# Patient Record
Sex: Male | Born: 1965 | Race: White | Hispanic: No | Smoking: Never smoker
Health system: Southern US, Community
[De-identification: ages and names within clinical notes are randomized; demographics above are authoritative.]

## PROBLEM LIST (undated history)

## (undated) DIAGNOSIS — M545 Low back pain, unspecified: Secondary | ICD-10-CM

## (undated) DIAGNOSIS — K219 Gastro-esophageal reflux disease without esophagitis: Secondary | ICD-10-CM

## (undated) DIAGNOSIS — E785 Hyperlipidemia, unspecified: Secondary | ICD-10-CM

## (undated) DIAGNOSIS — F419 Anxiety disorder, unspecified: Secondary | ICD-10-CM

## (undated) DIAGNOSIS — T7840XA Allergy, unspecified, initial encounter: Secondary | ICD-10-CM

## (undated) DIAGNOSIS — C61 Malignant neoplasm of prostate: Secondary | ICD-10-CM

## (undated) DIAGNOSIS — D649 Anemia, unspecified: Secondary | ICD-10-CM

## (undated) DIAGNOSIS — M109 Gout, unspecified: Secondary | ICD-10-CM

## (undated) DIAGNOSIS — I10 Essential (primary) hypertension: Secondary | ICD-10-CM

## (undated) DIAGNOSIS — IMO0002 Reserved for concepts with insufficient information to code with codable children: Secondary | ICD-10-CM

## (undated) HISTORY — PX: CHOLESTEATOMA EXCISION: SHX1345

## (undated) HISTORY — PX: OTHER SURGICAL HISTORY: SHX169

## (undated) HISTORY — DX: Reserved for concepts with insufficient information to code with codable children: IMO0002

## (undated) HISTORY — PX: VASECTOMY: SHX75

## (undated) HISTORY — PX: PROSTATE BIOPSY: SHX241

## (undated) HISTORY — DX: Gout, unspecified: M10.9

## (undated) HISTORY — DX: Anemia, unspecified: D64.9

## (undated) HISTORY — DX: Gastro-esophageal reflux disease without esophagitis: K21.9

## (undated) HISTORY — PX: CHOLECYSTECTOMY: SHX55

## (undated) HISTORY — DX: Gilbert syndrome: E80.4

## (undated) HISTORY — DX: Anxiety disorder, unspecified: F41.9

## (undated) HISTORY — DX: Hyperlipidemia, unspecified: E78.5

## (undated) HISTORY — PX: WISDOM TOOTH EXTRACTION: SHX21

## (undated) HISTORY — DX: Allergy, unspecified, initial encounter: T78.40XA

## (undated) HISTORY — DX: Essential (primary) hypertension: I10

---

## 1997-10-07 ENCOUNTER — Ambulatory Visit (HOSPITAL_COMMUNITY): Admission: RE | Admit: 1997-10-07 | Discharge: 1997-10-07 | Payer: Self-pay | Admitting: *Deleted

## 1998-03-14 ENCOUNTER — Ambulatory Visit (HOSPITAL_COMMUNITY): Admission: RE | Admit: 1998-03-14 | Discharge: 1998-03-14 | Payer: Self-pay | Admitting: *Deleted

## 2002-07-14 ENCOUNTER — Emergency Department (HOSPITAL_COMMUNITY): Admission: EM | Admit: 2002-07-14 | Discharge: 2002-07-14 | Payer: Self-pay | Admitting: Emergency Medicine

## 2002-07-14 ENCOUNTER — Encounter: Payer: Self-pay | Admitting: Emergency Medicine

## 2002-07-19 ENCOUNTER — Encounter: Payer: Self-pay | Admitting: Surgery

## 2002-07-19 ENCOUNTER — Encounter (INDEPENDENT_AMBULATORY_CARE_PROVIDER_SITE_OTHER): Payer: Self-pay | Admitting: *Deleted

## 2002-07-19 ENCOUNTER — Encounter: Payer: Self-pay | Admitting: Emergency Medicine

## 2002-07-19 ENCOUNTER — Encounter (INDEPENDENT_AMBULATORY_CARE_PROVIDER_SITE_OTHER): Payer: Self-pay | Admitting: Specialist

## 2002-07-19 ENCOUNTER — Inpatient Hospital Stay (HOSPITAL_COMMUNITY): Admission: EM | Admit: 2002-07-19 | Discharge: 2002-07-21 | Payer: Self-pay | Admitting: Emergency Medicine

## 2002-07-20 ENCOUNTER — Encounter: Payer: Self-pay | Admitting: Surgery

## 2002-07-20 ENCOUNTER — Encounter (INDEPENDENT_AMBULATORY_CARE_PROVIDER_SITE_OTHER): Payer: Self-pay | Admitting: *Deleted

## 2002-08-02 ENCOUNTER — Encounter (INDEPENDENT_AMBULATORY_CARE_PROVIDER_SITE_OTHER): Payer: Self-pay | Admitting: *Deleted

## 2009-03-01 ENCOUNTER — Emergency Department (HOSPITAL_BASED_OUTPATIENT_CLINIC_OR_DEPARTMENT_OTHER): Admission: EM | Admit: 2009-03-01 | Discharge: 2009-03-02 | Payer: Self-pay | Admitting: Emergency Medicine

## 2009-03-02 ENCOUNTER — Telehealth (INDEPENDENT_AMBULATORY_CARE_PROVIDER_SITE_OTHER): Payer: Self-pay | Admitting: *Deleted

## 2009-03-02 ENCOUNTER — Ambulatory Visit: Payer: Self-pay | Admitting: Gastroenterology

## 2009-03-02 ENCOUNTER — Telehealth: Payer: Self-pay | Admitting: Gastroenterology

## 2009-03-09 ENCOUNTER — Ambulatory Visit: Payer: Self-pay | Admitting: Gastroenterology

## 2009-03-09 ENCOUNTER — Encounter: Payer: Self-pay | Admitting: Gastroenterology

## 2009-03-10 ENCOUNTER — Encounter (INDEPENDENT_AMBULATORY_CARE_PROVIDER_SITE_OTHER): Payer: Self-pay

## 2009-03-13 ENCOUNTER — Encounter: Payer: Self-pay | Admitting: Gastroenterology

## 2009-04-12 ENCOUNTER — Ambulatory Visit: Payer: Self-pay | Admitting: Gastroenterology

## 2009-04-12 DIAGNOSIS — K222 Esophageal obstruction: Secondary | ICD-10-CM | POA: Insufficient documentation

## 2009-07-15 HISTORY — PX: UPPER GASTROINTESTINAL ENDOSCOPY: SHX188

## 2010-03-12 ENCOUNTER — Encounter (INDEPENDENT_AMBULATORY_CARE_PROVIDER_SITE_OTHER): Payer: Self-pay | Admitting: *Deleted

## 2010-08-16 NOTE — Letter (Signed)
Summary: Office Visit Letter  [facility]  [address] [place], [place] [address]   Phone: [phone]  Fax: [phone]      March 12, 2010 MRN: [id number]   [facility] [address] [place], [place]  [address]   Dear Mr. [facility],   According to our records, it is time for you to schedule a follow-up office visit with [place].   At your convenience, please call [phone] (option #2)to schedule an office visit. If you have any questions, concerns, or feel that this letter is in error, we would appreciate your call.   Sincerely,  Judie Petit T. [name], M.D.  [facility] [phone]

## 2010-08-24 ENCOUNTER — Ambulatory Visit (INDEPENDENT_AMBULATORY_CARE_PROVIDER_SITE_OTHER): Payer: 59 | Admitting: Gastroenterology

## 2010-08-24 ENCOUNTER — Encounter: Payer: Self-pay | Admitting: Gastroenterology

## 2010-08-24 DIAGNOSIS — K219 Gastro-esophageal reflux disease without esophagitis: Secondary | ICD-10-CM

## 2010-08-30 NOTE — Assessment & Plan Note (Signed)
Summary: Refills   History of Present Illness Visit Type: Follow-up Visit Primary GI MD: Elie Goody MD [facility] Primary Provider: Marisue Brooklyn, DO  Requesting Provider: n/a Chief Complaint: Follow up for Omeprazole refills, Pt denies any GI complaints History of Present Illness:   This is a return office visit for GERD with a prior history of a peptic stricture. He denies dysphagia. His reflux symptoms are under excellent control on omeprazole. He occasionally has some mild breakthrough symptoms with certain foods.   GI Review of Systems      Denies abdominal pain, acid reflux, belching, bloating, chest pain, dysphagia with liquids, dysphagia with solids, heartburn, loss of appetite, nausea, vomiting, vomiting blood, weight loss, and  weight gain.        Denies anal fissure, black tarry stools, change in bowel habit, constipation, diarrhea, diverticulosis, fecal incontinence, heme positive stool, hemorrhoids, irritable bowel syndrome, jaundice, light color stool, liver problems, rectal bleeding, and  rectal pain.   Current Medications (verified): 1)  Multivitamins   Tabs (Multiple Vitamin) .... One Tablet By Mouth Daily 2)  Omeprazole 40 Mg Cpdr (Omeprazole) .... One Tablet By Mouth Every Morning 3)  Ibuprofen 400 Mg Tabs (Ibuprofen) .Marland Kitchen.. 1 By Mouth At Bedtime As Needed  Allergies (verified): No Known Drug Allergies  Past History:  Past Medical History: Last updated: 04/12/2009 GERD Hiatal hernia Peptic esophageal stricture, 2010  Past Surgical History: Last updated: 04/11/2009 Cholecystectomy  Family History: Last updated: 04/12/2009 No FH of Colon Cancer: Lung Cancer: Mother  Family History of Liver Cancer:? Mother  Family History of Heart Disease: MGM  Social History: Last updated: 04/12/2009 Patient has never smoked.  Occupation: Emergency planning/management officer  Married 2 childern  Alcohol Use - no Daily Caffeine Use: 4 daily   Review of Systems       The pertinent  positives and negatives are noted as above and in the HPI. All other ROS were reviewed and were negative.   Vital Signs:  Patient profile:   45 year old male Height:      74 inches Weight:      278 pounds BMI:     35.82 BSA:     2.50 Pulse rate:   100 / minute Pulse rhythm:   regular BP sitting:   112 / 80  (left arm)  Vitals Entered By: Merri Ray CMA Duncan Dull) (August 24, 2010 4:02 PM)  Physical Exam  General:  Well developed, well nourished, no acute distress. Head:  Normocephalic and atraumatic. Eyes:  PERRLA, no icterus. Ears:  Normal auditory acuity. Mouth:  No deformity or lesions, dentition normal. Lungs:  Clear throughout to auscultation. Heart:  Regular rate and rhythm; no murmurs, rubs,  or bruits. Abdomen:  Soft, nontender and nondistended. No masses, hepatosplenomegaly or hernias noted. Normal bowel sounds. Neurologic:  Alert and  oriented x4;  grossly normal neurologically. Psych:  Alert and cooperative. Normal mood and affect.  Impression & Recommendations:  Problem # 1:  GERD (ICD-530.81) Continue omeprazole 40 mg daily, and standard antireflux measures.  Problem # 2:  SCREENING COLORECTAL-CANCER (ICD-V76.51) Average risk for colorectal cancer. Plan for a screening colonoscopy at age 61, June 2015.  Patient Instructions: 1)  Prescription has been sent to your pharmacy.  2)  Please schedule a follow-up appointment in 1 year. 3)  Copy sent to : Marisue Brooklyn, DO 4)  The medication list was reviewed and reconciled.  All changed / newly prescribed medications were explained.  A complete medication list was provided  to the patient / caregiver.  Prescriptions: OMEPRAZOLE 40 MG CPDR (OMEPRAZOLE) one tablet by mouth every morning  #34 x 11   Entered by:   Christie Nottingham CMA (AAMA)   Authorized by:   Meryl Dare MD [facility]   Signed by:   Christie Nottingham CMA (AAMA) on 08/24/2010   Method used:   Electronically to        [facility] [phone][facility] (retail)        [address]       [place], [place]  [address]       Ph: [phone] or [phone]       Fax: [phone]   RxID:   [id number]

## 2010-08-30 NOTE — Op Note (Signed)
Summary: Laparoscopic Cholecystectomy    NAME:  [name], [name]                         ACCOUNT NO.:  [id number]   MEDICAL RECORD NO.:  [id number]                   PATIENT TYPE:  INP   LOCATION:  0105                                 [facility]ACILITY[facility]  [facility]   PHYSICIAN:  Abigail Miyamoto, M.D.              DATE OF BIRTH:  12-19-1965   DATE OF PROCEDURE:  07/19/2002  DATE OF DISCHARGE:                                 OPERATIVE REPORT   PREOPERATIVE DIAGNOSIS:  Acute cholecystitis.   POSTOPERATIVE DIAGNOSIS:  Acute cholecystitis with choledocholithiasis.   PROCEDURE:  Laparoscopic cholecystectomy with intraoperative cholangiogram.   SURGEON:  Abigail Miyamoto, M.D.   ASSISTANT:  Vikki Ports, M.D.   ANESTHESIA:  General endotracheal anesthesia, 0.25% Marcaine.   INDICATIONS:  [name] is a 45 year old gentleman who presented with  right upper quadrant pain, nausea, and vomiting. He was found on ultrasound  to have a thickened gallbladder wall, consistent with acute cholecystitis.  His common bile duct was measured at 7 mm.  There was an impacted gallstone.  The patient was also noted to have a mildly elevated white blood count and a  bilirubin of 1.6.  The decision was made to proceed to the operating room  for a cholecystectomy.   FINDINGS:  The patient was found to have a dilated common bile duct with a  stone stuck distally without evidence of contrast and fully passed it into  the duodenum.   PROCEDURE IN DETAIL:  The patient was brought to the operating room and  identified as [name].  He was placed supine on the operating room  table, and general anesthesia was induced.  His abdomen was then prepped and  draped in the usual sterile fashion.  Using a #15 blade, a small transverse  incision was made below the umbilicus.  The incision was carried down  through the fascia, which was then opened with a scalpel.  A hemostat was  then used to pass through the  peritoneal cavity.  Next, a 0 Vicryl  pursestring suture was placed around the fascial opening.  The Hasson port  was placed through the opening, and insufflation of the abdomen begun. Next,  a 12 mm port was placed in the patient's epigastrium.  Two 5 mm ports were  placed in the patient's right flank under direct vision.  The gallbladder  was then identified and found to be acutely inflamed.  It was grasped and  retracted above the liver bed.  The cystic duct was then dissected out and  clipped once distally.  It was then partly transected with scissors.  Several stones were pushed out of the opening in the cystic duct.  Next, an  angiocath was inserted in the right upper quadrant under direct vision.  The  cholangiocath was inserted through the angiocath and into the cystic duct.  A cholangiogram was then performed under direct fluoroscopy.  Good flow  of  contrast was seen in the entire common bile duct and biliary radicals.  A  stone could be identified in the distal common bile duct, and no contrast  could be seen and fully passed this into the duodenum. At this point, the  cholangiocather was removed.  One large stone did come out of the cystic  duct after this.  The cystic duct was then clipped between clips proximally  and completely transected. The cystic artery was then identified and clipped  twice proximally and once distally and transected with scissors.  The  gallbladder was then slowly dissected free from the liver bed with the  electrocautery.  Once the gallbladder was freed from the liver bed,  hemostasis was achieved with the cautery.  The gallbladder was then placed  in an endo sac.  The decision was then made to place a Blake drain into the  gallbladder fossa.  The Blake drain was inserted through the epigastric port  and pulled out through one of the lateral side ports.  The Blake drain was  placed into the gallbladder fossa.  It was secured in place with a single   nylon suture.  The abdomen was then copiously irrigated with normal saline.  Again, hemostasis appeared to be achieved.  The gallbladder was then removed  through the incision at the umbilicus, and a 0 Ethibond blanket was tied in  place, closing the fascial defect.  All ports were then removed under direct  vision.  The abdomen was deflated.  All incisions were then anesthetized  with 0.25% Marcaine and then closed with 4-0 Monocryl subcuticular sutures.  Steri-Strips, gauze, and tape were applied.  The patient tolerated the  procedure well.  All sponge, needle, and instrument counts were correct at  the end of the procedure.  The patient was then extubated in the operating  room and taken in a stable condition to the recovery room.   PLAN:  The plan will be to proceed with a gastroenterology consult  postoperatively.                                               Abigail Miyamoto, M.D.    DB/MEDQ  D:  07/19/2002  T:  07/19/2002  Job:  [id number]   cc:   Petra Kuba, M.D.  [address]  [place]  [place] [address]  Fax: [phone]       SP Surgical Pathology - STATUS: Final             By: Morrie Sheldon,       Perform Date: 5 Jan04 00:00  Ordered By: Forrestine Him Date:  Facility: [facility]                              Department: CPATH  Service Report Text  [facility]   [address] [place], [place] [address]   [phone]    REPORT OF SURGICAL PATHOLOGY    Case #: [id number]   Patient Name: [name], [name]   PID: [id number]   Pathologist: Beulah Gandy. Luisa Hart, MD   DOB/Age [birth year] (Age: 43) Gender: M   Date Taken: 07/19/2002   Date Received: 07/19/2002  FINAL DIAGNOSIS    ***MICROSCOPIC EXAMINATION AND DIAGNOSIS***    GALLBLADDER: CHRONIC CHOLECYSTITIS AND CHOLELITHIASIS.    mw   Date Reported: 07/20/2002 Beulah Gandy. Luisa Hart, MD   *** Electronically Signed Out By JDP ***    Clinical information   Acute  cholecystitis. (tmc)    specimen(s) obtained   Gallbladder    Gross Description   Size/?Intact: Formalin fixed, 8 x 4 cm and includes a short   segment of cystic duct. The duct is transverse by a metallic   clip.   Serosal surface: Pink, smooth and glistening   Mucosa/Wall: Slight congestion and slight cholesterolosis/ 0.1 to   0.2 cm in thickness excluding subserosal fat   Contents: Greenish yellow bile and ten fine granular yellow tan   calculi that vary from 0.2 to 0.4 cm in diameter.   Cystic duct: Contains a 0.3 cm calculus similar to those   previously described.   Block Summary: Sections are submitted in one cassette.   JBM:mw 1/5    mw/

## 2010-11-30 NOTE — Consult Note (Signed)
[name], [name]                         ACCOUNT NO.:  [id number]   MEDICAL RECORD NO.:  [id number]                   PATIENT TYPE:  INP   LOCATION:  0105                                 FACILITY:  [facility]   PHYSICIAN:  Petra Kuba, M.D.                 DATE OF BIRTH:  July 22, 1965   DATE OF CONSULTATION:  07/19/2002  DATE OF DISCHARGE:                                   CONSULTATION   HISTORY:  The patient seen at the request of Abigail Miyamoto, M.D. for  positive intraoperative cholangiogram and consideration of ERCP.  He has  been having some GI upset usually brought on by spicy foods treated with  Aciphex which seemed to help for the last six months.  However, beginning on  the 31st began having significant pain radiating straight from his mid  epigastric area to the back.  Initially was seen in the ER and set up to see  one of my partners, Bernette Redbird, M.D. but when the pain recurred,  presented to the emergency room.  Ultrasound was done which showed  gallstones and question of acute cholecystitis and underwent emergent  surgery with the above mentioned findings.   PAST MEDICAL HISTORY:  Essentially negative.  He has not had any other  surgeries.   FAMILY HISTORY:  Pertinent for a mother with gallbladder problems.   SOCIAL HISTORY:  Does not work, no smoking or drinking.   ALLERGIES:  No known allergies.   CURRENT MEDICATIONS:  Aciphex only.   REVIEW OF SYSTEMS:  Obtained by the wife.  Negative except above.   PHYSICAL EXAMINATION:  The patient in recovery room.  Not examined today.  Will be prior to ERCP.   ASSESSMENT:  Positive intraoperative cholangiogram for CPD stone and no  contrast entering the duodenum as well as mildly elevated liver tests.    PLAN:  The risks, benefits, methods of ERCP, risk of open cholecystectomy  was discussed with the wife.  We also talked about sphincterotomy, stone  extraction, and possibly stent placement.  We will proceed  tomorrow at  10:30.  I have discussed the case with Abigail Miyamoto, M.D. and will  proceed sooner or p.r.n.  Will go ahead and repeat laboratories, continue  antibiotics as you are doing.  Thank you very much for the consultation.                                               Petra Kuba, M.D.    MEM/MEDQ  D:  07/19/2002  T:  07/19/2002  Job:  [id number]   cc:   Abigail Miyamoto, M.D.  [address]  [place]  [place] [address]  Fax: [phone]   Lovenia Kim, D.O.  [address] [place]. [address]  [place] [address]  Fax: [phone]

## 2010-11-30 NOTE — Op Note (Signed)
NAMEABDUR, HOGLUND                         ACCOUNT NO.:  [id number]   MEDICAL RECORD NO.:  [id number]                   PATIENT TYPE:  INP   LOCATION:  0105                                 FACILITY:  [facility]   PHYSICIAN:  Petra Kuba, M.D.                 DATE OF BIRTH:  1965-10-17   DATE OF PROCEDURE:  07/20/2002  DATE OF DISCHARGE:                                 OPERATIVE REPORT   PROCEDURE:  Esophagogastroduodenoscopy, endoscopic retrograde  cholangiopancreatography, sphincterotomy, and stone extraction.   INDICATIONS:  Abnormal intraoperative cholangiogram worrisome for a stone  obstructing the duct as well as dysphagia.  Consent was signed after risks,  benefits, methods, and options thoroughly discussed with both the wife and  the patient on two different occasions.   MEDICATIONS:  Demerol 125 mg,  Versed 10 mg.   DESCRIPTION OF PROCEDURE:  First the straight-viewing video endoscope was  inserted by direct vision.  The esophagus was normal.  There might have been  a tiny hiatal hernia but no obvious ring or stricture.  The scope passed  into the stomach, where some NG trauma was seen, and advanced through a  normal antrum, through a normal pylorus, into a normal duodenal bulb, and  around the C-loop to a normal second portion of the duodenum.  The scope was  withdrawn back to the bulb, and a good look there ruled out ulcers in that  location.  The scope was withdrawn back to the stomach and retroflexed.  The  angularis was normal.  High in the cardia some NG tube trauma was seen but  no obvious abnormality.  Straight visualization of the stomach was normal  without additional findings.  The scope was then slowly withdrawn, again  confirming the normal esophagus.  The scope was removed and the side-viewing  video therapeutic duodenoscope was inserted by indirect vision into the  stomach and advanced into the duodenum in the customary fashion.  A normal-  appearing  ampulla was brought into view.  Using the triple-lumen  sphincterotome on the first cannulation, minimal PD was seen.  The  sphincterotome was repositioned and deep selective cannulation was obtained.  On the first injection, a small CBD stone was seen.  No other obvious  abnormalities were seen on injection.  The Al Pimple was advanced into the  intrahepatic.  We went ahead and proceeded with a moderate-sized  sphincterotomy until adequate drainage was seen.  We were able to advance  the fully-bowed sphincterotome in and out of the duct easily.  No bleeding  was seen.  On removing the fully-bowed sphincterotome, one small stone was  delivered.  We went ahead and exchanged the sphincterotome for an 8.5 mm  balloon and on the first balloon pull-through, two other small stones and  some debris were removed.  We went ahead and proceeded with two more balloon  pull-throughs without any stones or debris  being delivered.  On the fourth  balloon pull-through we went ahead and proceeded with an occlusion  cholangiogram, which was normal.  The balloon did pass with only minimal  resistance through the ampulla.  We elected to stop the procedure at this  juncture.  The scope was removed.  The patient tolerated the procedure well.  There was no obvious immediate complication.   ENDOSCOPIC DIAGNOSES:  1. Questionable tiny hiatal hernia without ring or stricture.  2. Some nasogastric tube trauma in the cardia and in the proximal stomach.  3. Otherwise normal esophagogastroduodenoscopy.  4. Normal ampulla.  5. One minimal pancreatic duct injection.  6. One common bile duct stone on initial injection without other obvious     abnormalities.  7. Therapy:  Medium-sized sphincterotomy and multiple stones and debris     removed on multiple 8.5 mm balloon pull-throughs.  8. Negative two more sweeps at the end of the procedure and a negative     occlusion cholangiogram.   PLAN:  1. Customary post-ERCP and  sphincterotomy orders.  2. Continue antibiotics.  3. Recheck liver tests in the a.m.  4. Slowly advance diet if no signs of pancreatitis or other complications.  5. Hopefully, can go home tomorrow.                                               Petra Kuba, M.D.    MEM/MEDQ  D:  07/20/2002  T:  07/20/2002  Job:  [id number]   cc:   Abigail Miyamoto, M.D.  [address]  [place]  [place] [address]  Fax: [phone]   Lovenia Kim, D.O.  [address] [place]  [place]  [place] [address]  Fax: [phone]

## 2010-11-30 NOTE — Discharge Summary (Signed)
NAMESHANTA, DORVIL                         ACCOUNT NO.:  [id number]   MEDICAL RECORD NO.:  [id number]                   PATIENT TYPE:  INP   LOCATION:  0481                                 [facility]ACILITY[facility]   PHYSICIAN:  Abigail Miyamoto, M.D.              DATE OF BIRTH:  [birth year]   DATE OF ADMISSION:  07/19/2002  DATE OF DISCHARGE:  07/21/2002                                 DISCHARGE SUMMARY   DISCHARGE DIAGNOSES:  1. Cholecystitis, status post laparoscopic cholecystectomy and     intraoperative cholangiogram.  2. Choledocholithiasis, status post endoscopic retrograde     cholangiopancreatography and stone extraction by Dr. Vida Rigger.   HISTORY OF PRESENT ILLNESS:  The patient is a 45 year old gentleman who  presented to the emergency department at [facility] on 07/19/02, with several  days of epigastric pain, nausea and vomiting.  He was found to have a tender  abdomen on abdominal examination and mildly elevated liver function tests,  with a white blood cell count also elevated at 12.3.  Ultrasound suggested a  cholelithiasis, cholecystitis, and mildly dilated common bile duct.   HOSPITAL COURSE:  The patient was admitted and taken to the operating room  where he underwent a laparoscopic cholecystectomy with intraoperative  cholangiogram.  He was found to have cholecystitis as well as a stone stuck  to his distal common bile duct.  Postoperatively, he was taken to a regular  surgical floor, and GI consultation was obtained from Dr. Vida Rigger.  He  was taken to the endoscopy suite on 07/20/02, and underwent an endoscopic  retrograde cholangiopancreatography with sphincterotomy and stone  extraction.  He tolerated this well.  Was taken in stable condition  back to  the floor.  A Blake drain was left in his abdomen postoperatively, and it  still drained only serosanguinous fluid.  By postoperative day #2, his white  blood cell count was normal, his liver function tests were  only mildly  elevated.  He was feeling well and tolerating a regular diet.  At this  point, his drain was removed, and he was discharged home.   DISCHARGE MEDICATIONS:  1. Percocet for pain.  2. Tylenol for pain.  3. He is to avoid aspirin, Aleve, Advil, Motrin, Goody powders, etc.   ACTIVITY:  He should do no heavy lifting greater then 20 pounds for three  weeks.  He may shower.    FOLLOWUP:  1. He will follow up in my office in one week post discharge.  At that time,     we will draw liver function tests.  2. He is also to follow up with Dr. Ewing Schlein on a p.r.n. basis.  Abigail Miyamoto, M.D.    DB/MEDQ  D:  08/02/2002  T:  08/03/2002  Job:  [id number]   cc:   Petra Kuba, M.D.  [address]  [place]  [place] [address]  Fax: [phone]

## 2010-11-30 NOTE — Op Note (Signed)
[name], [name]                         ACCOUNT NO.:  [id number]   MEDICAL RECORD NO.:  [id number]                   PATIENT TYPE:  INP   LOCATION:  0105                                 FACILITY:  [facility]   PHYSICIAN:  Abigail Miyamoto, M.D.              DATE OF BIRTH:  [birth year]   DATE OF PROCEDURE:  07/19/2002  DATE OF DISCHARGE:                                 OPERATIVE REPORT   PREOPERATIVE DIAGNOSIS:  Acute cholecystitis.   POSTOPERATIVE DIAGNOSIS:  Acute cholecystitis with choledocholithiasis.   PROCEDURE:  Laparoscopic cholecystectomy with intraoperative cholangiogram.   SURGEON:  Abigail Miyamoto, M.D.   ASSISTANT:  Vikki Ports, M.D.   ANESTHESIA:  General endotracheal anesthesia, 0.25% Marcaine.   INDICATIONS:  [name] is a 45 year old gentleman who presented with  right upper quadrant pain, nausea, and vomiting. He was found on ultrasound  to have a thickened gallbladder wall, consistent with acute cholecystitis.  His common bile duct was measured at 7 mm.  There was an impacted gallstone.  The patient was also noted to have a mildly elevated white blood count and a  bilirubin of 1.6.  The decision was made to proceed to the operating room  for a cholecystectomy.   FINDINGS:  The patient was found to have a dilated common bile duct with a  stone stuck distally without evidence of contrast and fully passed it into  the duodenum.   PROCEDURE IN DETAIL:  The patient was brought to the operating room and  identified as [name].  He was placed supine on the operating room  table, and general anesthesia was induced.  His abdomen was then prepped and  draped in the usual sterile fashion.  Using a #15 blade, a small transverse  incision was made below the umbilicus.  The incision was carried down  through the fascia, which was then opened with a scalpel.  A hemostat was  then used to pass through the peritoneal cavity.  Next, a 0 Vicryl  pursestring suture was placed around the fascial opening.  The Hasson port  was placed through the opening, and insufflation of the abdomen begun. Next,  a 12 mm port was placed in the patient's epigastrium.  Two 5 mm ports were  placed in the patient's right flank under direct vision.  The gallbladder  was then identified and found to be acutely inflamed.  It was grasped and  retracted above the liver bed.  The cystic duct was then dissected out and  clipped once distally.  It was then partly transected with scissors.  Several stones were pushed out of the opening in the cystic duct.  Next, an  angiocath was inserted in the right upper quadrant under direct vision.  The  cholangiocath was inserted through the angiocath and into the cystic duct.  A cholangiogram was then performed under direct fluoroscopy.  Good flow of  contrast was seen in  the entire common bile duct and biliary radicals.  A  stone could be identified in the distal common bile duct, and no contrast  could be seen and fully passed this into the duodenum. At this point, the  cholangiocather was removed.  One large stone did come out of the cystic  duct after this.  The cystic duct was then clipped between clips proximally  and completely transected. The cystic artery was then identified and clipped  twice proximally and once distally and transected with scissors.  The  gallbladder was then slowly dissected free from the liver bed with the  electrocautery.  Once the gallbladder was freed from the liver bed,  hemostasis was achieved with the cautery.  The gallbladder was then placed  in an endo sac.  The decision was then made to place a Blake drain into the  gallbladder fossa.  The Blake drain was inserted through the epigastric port  and pulled out through one of the lateral side ports.  The Blake drain was  placed into the gallbladder fossa.  It was secured in place with a single  nylon suture.  The abdomen was then  copiously irrigated with normal saline.  Again, hemostasis appeared to be achieved.  The gallbladder was then removed  through the incision at the umbilicus, and a 0 Ethibond blanket was tied in  place, closing the fascial defect.  All ports were then removed under direct  vision.  The abdomen was deflated.  All incisions were then anesthetized  with 0.25% Marcaine and then closed with 4-0 Monocryl subcuticular sutures.  Steri-Strips, gauze, and tape were applied.  The patient tolerated the  procedure well.  All sponge, needle, and instrument counts were correct at  the end of the procedure.  The patient was then extubated in the operating  room and taken in a stable condition to the recovery room.   PLAN:  The plan will be to proceed with a gastroenterology consult  postoperatively.                                               Abigail Miyamoto, M.D.    DB/MEDQ  D:  07/19/2002  T:  07/19/2002  Job:  [id number]   cc:   Petra Kuba, M.D.  [address]  [place]  [place] [address]  Fax: [phone]

## 2011-08-25 ENCOUNTER — Other Ambulatory Visit: Payer: Self-pay | Admitting: Gastroenterology

## 2011-08-26 NOTE — Telephone Encounter (Signed)
NEEDS OFFICE VISIT FOR ANY FURTHER REFILLS! 

## 2013-04-25 ENCOUNTER — Encounter: Payer: Self-pay | Admitting: *Deleted

## 2013-04-25 DIAGNOSIS — F419 Anxiety disorder, unspecified: Secondary | ICD-10-CM | POA: Insufficient documentation

## 2013-04-25 DIAGNOSIS — K219 Gastro-esophageal reflux disease without esophagitis: Secondary | ICD-10-CM | POA: Insufficient documentation

## 2013-04-25 DIAGNOSIS — E785 Hyperlipidemia, unspecified: Secondary | ICD-10-CM | POA: Insufficient documentation

## 2013-04-30 ENCOUNTER — Encounter: Payer: Self-pay | Admitting: Internal Medicine

## 2013-05-18 ENCOUNTER — Ambulatory Visit: Payer: 59 | Admitting: Internal Medicine

## 2013-05-18 ENCOUNTER — Encounter: Payer: Self-pay | Admitting: Internal Medicine

## 2013-05-18 VITALS — BP 126/86 | HR 64 | Temp 97.9°F | Resp 18 | Wt 254.6 lb

## 2013-05-18 DIAGNOSIS — R5381 Other malaise: Secondary | ICD-10-CM

## 2013-05-18 DIAGNOSIS — E782 Mixed hyperlipidemia: Secondary | ICD-10-CM

## 2013-05-18 DIAGNOSIS — R5383 Other fatigue: Secondary | ICD-10-CM

## 2013-05-18 NOTE — Progress Notes (Signed)
Patient ID: [name], male   DOB: 10/10/1965, 47 y.o.   MRN: [id number] Pt returns for f/u. Discussed elevated cholesterol. Discussed diet restrictions. Also discussed issues of fatigue as relates to marital separation, insomnia, daytime sleepiness, evening time fatigue. Mild snoring - no hx of sleep apnea. Seeing Dr Annabell Howells soon for follow up. Discussed low normal testosterone(329) and he will ask Dr Annabell Howells to check. No current c/o of GI Sx's.

## 2013-05-18 NOTE — Patient Instructions (Signed)
Diet and meds as discussed

## 2013-07-19 ENCOUNTER — Other Ambulatory Visit: Payer: Self-pay | Admitting: Emergency Medicine

## 2013-07-19 MED ORDER — CITALOPRAM HYDROBROMIDE 40 MG PO TABS: 40.0000 mg | ORAL_TABLET | Freq: Every day | ORAL | Status: DC

## 2013-07-20 ENCOUNTER — Other Ambulatory Visit: Payer: Self-pay | Admitting: Emergency Medicine

## 2013-08-30 ENCOUNTER — Other Ambulatory Visit: Payer: Self-pay | Admitting: Emergency Medicine

## 2013-08-30 MED ORDER — ALPRAZOLAM 0.5 MG PO TABS: 0.5000 mg | ORAL_TABLET | Freq: Two times a day (BID) | ORAL | Status: DC

## 2013-09-21 ENCOUNTER — Other Ambulatory Visit: Payer: Self-pay | Admitting: Emergency Medicine

## 2013-10-05 ENCOUNTER — Encounter: Payer: Self-pay | Admitting: Gastroenterology

## 2013-10-25 ENCOUNTER — Other Ambulatory Visit: Payer: Self-pay | Admitting: Emergency Medicine

## 2013-11-08 ENCOUNTER — Encounter: Payer: Self-pay | Admitting: Physician Assistant

## 2013-11-24 ENCOUNTER — Other Ambulatory Visit: Payer: Self-pay | Admitting: Emergency Medicine

## 2013-12-13 ENCOUNTER — Encounter: Payer: Self-pay | Admitting: Physician Assistant

## 2013-12-20 ENCOUNTER — Encounter: Payer: Self-pay | Admitting: Physician Assistant

## 2013-12-20 ENCOUNTER — Ambulatory Visit (INDEPENDENT_AMBULATORY_CARE_PROVIDER_SITE_OTHER): Payer: 59 | Admitting: Physician Assistant

## 2013-12-20 VITALS — BP 122/78 | HR 80 | Temp 97.9°F | Resp 16 | Ht 73.5 in | Wt 276.0 lb

## 2013-12-20 DIAGNOSIS — Z0000 Encounter for general adult medical examination without abnormal findings: Secondary | ICD-10-CM

## 2013-12-20 DIAGNOSIS — E785 Hyperlipidemia, unspecified: Secondary | ICD-10-CM

## 2013-12-20 DIAGNOSIS — I10 Essential (primary) hypertension: Secondary | ICD-10-CM

## 2013-12-20 LAB — CBC WITH DIFFERENTIAL/PLATELET
BASOS PCT: 0 % (ref 0–1)
Basophils Absolute: 0 10*3/uL (ref 0.0–0.1)
EOS ABS: 0.3 10*3/uL (ref 0.0–0.7)
EOS PCT: 4 % (ref 0–5)
HCT: 39.6 % (ref 39.0–52.0)
HEMOGLOBIN: 13.6 g/dL (ref 13.0–17.0)
LYMPHS ABS: 1.8 10*3/uL (ref 0.7–4.0)
Lymphocytes Relative: 23 % (ref 12–46)
MCH: 28.9 pg (ref 26.0–34.0)
MCHC: 34.3 g/dL (ref 30.0–36.0)
MCV: 84.3 fL (ref 78.0–100.0)
MONO ABS: 0.7 10*3/uL (ref 0.1–1.0)
MONOS PCT: 9 % (ref 3–12)
Neutro Abs: 5.1 10*3/uL (ref 1.7–7.7)
Neutrophils Relative %: 64 % (ref 43–77)
Platelets: 266 10*3/uL (ref 150–400)
RBC: 4.7 MIL/uL (ref 4.22–5.81)
RDW: 14.4 % (ref 11.5–15.5)
WBC: 8 10*3/uL (ref 4.0–10.5)

## 2013-12-20 LAB — HEMOGLOBIN A1C
Hgb A1c MFr Bld: 5.8 % — ABNORMAL HIGH (ref <5.7)
MEAN PLASMA GLUCOSE: 120 mg/dL — AB (ref <117)

## 2013-12-20 NOTE — Patient Instructions (Addendum)
We want weight loss that will last so you should lose 1-2 pounds a week.  THAT IS IT! Please pick THREE things a month to change. Once it is a habit check off the item. Then pick another three items off the list to become habits.  If you are already doing a habit on the list GREAT!  Cross that item off! o Don't drink your calories. Ie, alcohol, soda, fruit juice, and sweet tea.  o Drink more water. Drink a glass when you feel hungry or before each meal.  o Eat breakfast - Complex carb and protein (likeDannon light and fit yogurt, oatmeal, fruit, eggs, [place] bacon). o Measure your cereal.  Eat no more than one cup a day. (ie [place]) o Eat an apple a day. o Add a vegetable a day. o Try a new vegetable a month. o Use Pam! Stop using oil or butter to cook. o Don't finish your plate or use smaller plates. o Share your dessert. o Eat sugar free Jello for dessert or frozen grapes. o Don't eat 2-3 hours before bed. o Switch to whole wheat bread, pasta, and brown rice. o Make healthier choices when you eat out. No fries! o Pick baked chicken, NOT fried. o Don't forget to SLOW DOWN when you eat. It is not going anywhere.  o Take the stairs. o Park far away in the parking lot o [employer] (or weights) for 10 minutes while watching TV. o Walk at work for 10 minutes during break. o Walk outside 1 time a week with your friend, kids, dog, or significant other. o Start a walking group at [place] as much as you can tolerate.  o Keep a food diary. o Weigh yourself daily. o Walk for 15 minutes 3 days per week. o Cook at home more often and eat out less.  If life happens and you go back to old habits, it is okay.  Just start over. You can do it!   If you experience chest pain, get short of breath, or tired during the exercise, please stop immediately and inform your doctor.    Bad carbs also include fruit juice, alcohol, and sweet tea. These are empty calories that do not signal to  your brain that you are full.   Please remember the good carbs are still carbs which convert into sugar. So please measure them out no more than 1/2-1 cup of rice, oatmeal, pasta, and beans.  Veggies are however free foods! Pile them on.   I like lean protein at every meal such as chicken, [place], pork chops, cottage cheese, etc. Just do not fry these meats and please center your meal around vegetable, the meats should be a side dish.   No all fruit is created equal. Please see the list below, the fruit at the bottom is higher in sugars than the fruit at the top   Add benefiber 1-2 TBSP in coffee/water/yogurt in the morning for 1 month for the diarrhea/soft stools.  Use a dropper to put olive oil or canola oil in the effected ear- 2-3 times a week. Let it soak for 20-30 min then you can take a shower or use a baby bulb with warm water to wash out the ear wax.  Do not use Qtips
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
## 2013-12-21 LAB — IRON AND TIBC
%SAT: 23 % (ref 20–55)
Iron: 76 ug/dL (ref 42–165)
TIBC: 335 ug/dL (ref 215–435)
UIBC: 259 ug/dL (ref 125–400)

## 2013-12-21 LAB — BASIC METABOLIC PANEL WITH GFR
BUN: 10 mg/dL (ref 6–23)
CALCIUM: 8.8 mg/dL (ref 8.4–10.5)
CO2: 27 meq/L (ref 19–32)
CREATININE: 1.03 mg/dL (ref 0.50–1.35)
Chloride: 103 mEq/L (ref 96–112)
GFR, Est Non African American: 84 mL/min
GLUCOSE: 116 mg/dL — AB (ref 70–99)
Potassium: 4.1 mEq/L (ref 3.5–5.3)
Sodium: 140 mEq/L (ref 135–145)

## 2013-12-21 LAB — URINALYSIS, ROUTINE W REFLEX MICROSCOPIC
Bilirubin Urine: NEGATIVE
Glucose, UA: NEGATIVE mg/dL
Hgb urine dipstick: NEGATIVE
Ketones, ur: NEGATIVE mg/dL
LEUKOCYTES UA: NEGATIVE
Nitrite: NEGATIVE
Protein, ur: NEGATIVE mg/dL
Specific Gravity, Urine: <1.005 — ABNORMAL LOW (ref 1.005–1.030)
Urobilinogen, UA: 0.2 mg/dL (ref 0.0–1.0)
pH: 6 (ref 5.0–8.0)

## 2013-12-21 LAB — LIPID PANEL
CHOL/HDL RATIO: 5.3 ratio
CHOLESTEROL: 184 mg/dL (ref 0–200)
HDL: 35 mg/dL — ABNORMAL LOW (ref >39)
LDL CALC: 88 mg/dL (ref 0–99)
TRIGLYCERIDES: 306 mg/dL — AB (ref <150)
VLDL: 61 mg/dL — ABNORMAL HIGH (ref 0–40)

## 2013-12-21 LAB — HEPATIC FUNCTION PANEL
ALBUMIN: 4.1 g/dL (ref 3.5–5.2)
ALT: 19 U/L (ref 0–53)
AST: 20 U/L (ref 0–37)
Alkaline Phosphatase: 69 U/L (ref 39–117)
Bilirubin, Direct: 0.1 mg/dL (ref 0.0–0.3)
Indirect Bilirubin: 0.3 mg/dL (ref 0.2–1.2)
TOTAL PROTEIN: 6.4 g/dL (ref 6.0–8.3)
Total Bilirubin: 0.4 mg/dL (ref 0.2–1.2)

## 2013-12-21 LAB — MICROALBUMIN / CREATININE URINE RATIO
CREATININE, URINE: 46 mg/dL
MICROALB UR: 0.5 mg/dL (ref 0.00–1.89)
Microalb Creat Ratio: 10.9 mg/g (ref 0.0–30.0)

## 2013-12-21 LAB — VITAMIN D 25 HYDROXY (VIT D DEFICIENCY, FRACTURES): Vit D, 25-Hydroxy: 41 ng/mL (ref 30–89)

## 2013-12-21 LAB — INSULIN, FASTING: Insulin fasting, serum: 137 u[IU]/mL — ABNORMAL HIGH (ref 3–28)

## 2013-12-21 LAB — MAGNESIUM: MAGNESIUM: 1.7 mg/dL (ref 1.5–2.5)

## 2013-12-21 LAB — VITAMIN B12: VITAMIN B 12: 452 pg/mL (ref 211–911)

## 2013-12-21 LAB — TSH: TSH: 2.578 u[IU]/mL (ref 0.350–4.500)

## 2014-01-15 ENCOUNTER — Other Ambulatory Visit: Payer: Self-pay | Admitting: Emergency Medicine

## 2014-03-02 ENCOUNTER — Other Ambulatory Visit: Payer: Self-pay | Admitting: Emergency Medicine

## 2014-04-03 ENCOUNTER — Other Ambulatory Visit: Payer: Self-pay | Admitting: Physician Assistant

## 2014-04-10 ENCOUNTER — Other Ambulatory Visit: Payer: Self-pay | Admitting: Physician Assistant

## 2014-05-04 ENCOUNTER — Other Ambulatory Visit: Payer: Self-pay | Admitting: Physician Assistant

## 2014-06-07 ENCOUNTER — Encounter: Payer: Self-pay | Admitting: Gastroenterology

## 2014-07-09 ENCOUNTER — Other Ambulatory Visit: Payer: Self-pay | Admitting: Physician Assistant

## 2014-07-19 DIAGNOSIS — I10 Essential (primary) hypertension: Secondary | ICD-10-CM

## 2014-07-27 ENCOUNTER — Ambulatory Visit (INDEPENDENT_AMBULATORY_CARE_PROVIDER_SITE_OTHER): Payer: 59 | Admitting: Physician Assistant

## 2014-07-27 ENCOUNTER — Encounter: Payer: Self-pay | Admitting: Physician Assistant

## 2014-07-27 VITALS — BP 120/78 | HR 62 | Temp 98.2°F | Resp 16 | Ht 73.5 in | Wt 264.0 lb

## 2014-07-27 DIAGNOSIS — E559 Vitamin D deficiency, unspecified: Secondary | ICD-10-CM

## 2014-07-27 DIAGNOSIS — F419 Anxiety disorder, unspecified: Secondary | ICD-10-CM

## 2014-07-27 DIAGNOSIS — R7309 Other abnormal glucose: Secondary | ICD-10-CM

## 2014-07-27 DIAGNOSIS — G4700 Insomnia, unspecified: Secondary | ICD-10-CM

## 2014-07-27 DIAGNOSIS — E669 Obesity, unspecified: Secondary | ICD-10-CM

## 2014-07-27 DIAGNOSIS — M791 Myalgia, unspecified site: Secondary | ICD-10-CM

## 2014-07-27 DIAGNOSIS — E785 Hyperlipidemia, unspecified: Secondary | ICD-10-CM

## 2014-07-27 DIAGNOSIS — K219 Gastro-esophageal reflux disease without esophagitis: Secondary | ICD-10-CM

## 2014-07-27 DIAGNOSIS — R7303 Prediabetes: Secondary | ICD-10-CM

## 2014-07-27 DIAGNOSIS — Z79899 Other long term (current) drug therapy: Secondary | ICD-10-CM

## 2014-07-27 LAB — CBC WITH DIFFERENTIAL/PLATELET
BASOS PCT: 1 % (ref 0–1)
Basophils Absolute: 0.1 10*3/uL (ref 0.0–0.1)
EOS ABS: 0.4 10*3/uL (ref 0.0–0.7)
Eosinophils Relative: 5 % (ref 0–5)
HEMATOCRIT: 43.2 % (ref 39.0–52.0)
HEMOGLOBIN: 14.4 g/dL (ref 13.0–17.0)
Lymphocytes Relative: 25 % (ref 12–46)
Lymphs Abs: 1.9 10*3/uL (ref 0.7–4.0)
MCH: 28.6 pg (ref 26.0–34.0)
MCHC: 33.3 g/dL (ref 30.0–36.0)
MCV: 85.7 fL (ref 78.0–100.0)
MONOS PCT: 9 % (ref 3–12)
MPV: 9.1 fL (ref 8.6–12.4)
Monocytes Absolute: 0.7 10*3/uL (ref 0.1–1.0)
NEUTROS PCT: 60 % (ref 43–77)
Neutro Abs: 4.5 10*3/uL (ref 1.7–7.7)
Platelets: 255 10*3/uL (ref 150–400)
RBC: 5.04 MIL/uL (ref 4.22–5.81)
RDW: 14.7 % (ref 11.5–15.5)
WBC: 7.5 10*3/uL (ref 4.0–10.5)

## 2014-07-27 NOTE — Progress Notes (Signed)
Assessment and Plan:  1. Hyperlipidemia Please start taking Fish Oil 1,000mg  (2-3 capsules per day).  Please follow recommended diet and exercise.  Check cholesterol. - Lipid panel  2. Prediabetes and Obesity Please follow recommended diet and exercise.  Check A1C and Insulin levels. - Hemoglobin A1c - Insulin, fasting  3. Gastroesophageal reflux disease, esophagitis presence not specified Continue Prilosec as prescribed.    4. Anxiety and Insomnia Continue Xanax and Celexa as prescribed.  5. Vitamin D deficiency Continue vitamin D OTC.  Check vitamin D level. - Vit D  25 hydroxy   6. Myalgia Please start taking Magnesium 250mg - 1 tablet daily.  Check magnesium level. - Magnesium  7. Encounter for long-term (current) use of medications Will monitor kidney and liver function. - CBC with Differential - BASIC METABOLIC PANEL WITH GFR - Hepatic function panel - Magnesium  Reminder to go to the ER if any CP, SOB, nausea, dizziness, severe HA, changes vision/speech, left arm numbness and tingling, and jaw pain. Continue diet and meds as discussed. Further disposition pending results of labs. Discussed medication effects and SE's.  Pt agreed to treatment plan. Please follow up in 3 months.  HPI A Caucasian 49 y.o. male presents for 6 month follow up with hyperlipidemia, prediabetes, GERD, anxiety, insomnia and vitamin D.  Last visit was 12/20/13.  Patient is taking Xanax 0.5mg - 1 tablet at bedtime for sleep and Celexa 40mg - 1 tablet at bedtime for anxiety.  He states both of those medications are working great for insomnia and anxiety.  Patient is taking Prilosec for GERD and states it has been helping without complications.  His blood pressure has been controlled at home, today their BP is BP: 120/78 mmHg He does workout and started back at gym and going twice a week.  He denies chest pain, shortness of breath, dizziness.   He is not on cholesterol medication and denies myalgias.  His cholesterol is not at goal. The cholesterol last visit was:   Lab Results  Component Value Date   CHOL 184 12/20/2013   HDL 35* 12/20/2013   LDLCALC 88 12/20/2013   TRIG 306* 12/20/2013   CHOLHDL 5.3 12/20/2013   He has not been working on diet and has been working on exercise for prediabetes, and denies increased appetite, polydipsia and polyuria. Last A1C in the office was:  Lab Results  Component Value Date   HGBA1C 5.8* 12/20/2013  Had prediabetes since June 2015. Currently manages prediabetes with diet and exercise Number of meals per day? 3  B- yogurt, poptart, pastry if in hurry  L- tuna salad and crackers, salad  D- chicken, pasta  Snacks?  Occasionally- oatmeal cookie  Beverages? Diet soda-caffeine free, sweet tea, water  Patient is on Vitamin D supplement.- Was taking before and just started back recently.  He is taking 2 chewables a day, but does not know strength.  Did not start taking Magnesium.   Lab Results  Component Value Date   VD25OH 41 12/20/2013     Current Medications:  Current Outpatient Prescriptions on File Prior to Visit  Medication Sig Dispense Refill  . ALPRAZolam (XANAX) 0.5 MG tablet TAKE 1 TABLET BY MOUTH TWICE A DAY 60 tablet 1  . citalopram (CELEXA) 40 MG tablet Take 1 tablet (40 mg total) by mouth daily. 30 tablet 0  . ibuprofen (ADVIL,MOTRIN) 200 MG tablet Take 200 mg by mouth 2 (two) times daily as needed for pain.    Marland Kitchen omeprazole (PRILOSEC) 40 MG capsule TAKE  ONE CAPSULE BY MOUTH EVERY DAY 90 capsule 0   No current facility-administered medications on file prior to visit.   Medical History:  Past Medical History  Diagnosis Date  . Hyperlipidemia   . Anxiety   . Hypertension   . GERD (gastroesophageal reflux disease)   . Gilbert's syndrome   . Allergy   . DDD (degenerative disc disease)   . Gout   . Anemia    Allergies: No Known Allergies   ROS- Review of Systems  Constitutional: Negative.   HENT: Negative.  Negative for  congestion, ear discharge, ear pain and sore throat.        Sore throat is better now, but had one a couple days ago.  Eyes: Negative.   Respiratory: Negative.   Cardiovascular: Negative.   Gastrointestinal: Negative.   Genitourinary: Negative.   Musculoskeletal: Negative.   Skin: Negative.   Neurological: Negative.  Negative for headaches.  Psychiatric/Behavioral: Negative for depression. The patient is nervous/anxious and has insomnia.    Family history- Review and unchanged Social history- Review and unchanged  Physical Exam: BP 120/78 mmHg  Pulse 62  Temp(Src) 98.2 F (36.8 C) (Temporal)  Resp 16  Ht 6' 1.5" (1.867 m)  Wt 264 lb (119.75 kg)  BMI 34.35 kg/m2  SpO2 97% Wt Readings from Last 3 Encounters:  07/27/14 264 lb (119.75 kg)  12/20/13 276 lb (125.193 kg)  05/18/13 254 lb 9.6 oz (115.486 kg)  Vitals Reviewed. General Appearance: Well nourished, in no apparent distress. Obese. Eyes: PERRLA, EOMIs, conjunctiva no swelling or erythema.  No scleral icterus. Sinuses: No Frontal/maxillary tenderness ENT/Mouth: External auditory canals clear, TMs without erythema, edema or bulging. No erythema, edema, or exudate on posterior pharynx.  Tonsils not swollen or erythematous. Hearing normal.  Neck: Supple, thyroid normal.  Respiratory: Respiratory effort normal, CTAB.  No w/r/r or stridor.  Cardio: RRR.  m/r/g. S1S2nl. Brisk peripheral pulses without edema.  Abdomen: Soft, + normal BS.  Non tender, no guarding, rebound, hernias, masses. Lymphatics: Non tender without lymphadenopathy.  Musculoskeletal: Full ROM, 5/5 strength, normal gait.  Skin: Warm, dry intact without rashes, lesions, ecchymosis.  Neuro: Cranial nerves intact. No cerebellar symptoms. Sensation intact.  Psych: Awake and oriented X 3, normal affect, Insight and Judgment appropriate.    Reinaldo Helt, Stephani Police, PA-C 8:55 AM [facility]

## 2014-07-27 NOTE — Patient Instructions (Addendum)
-Continue medications as prescribed. Please cut back on carbs (pasta and cookies) and sweet tea.  Please eat more vegetables and white meat. -Please start taking Fish Oil 1,000mg  (2-3 capsules per day with meals). -Please start taking Magnesium 250mg - 1 tablet daily. -Please continue vitamin D.  Please follow up in 3 months.      Bad carbs also include fruit juice, alcohol, and sweet tea. These are empty calories that do not signal to your brain that you are full.   Please remember the good carbs are still carbs which convert into sugar. So please measure them out no more than 1/2-1 cup of rice, oatmeal, pasta, and beans  Veggies are however free foods! Pile them on.   Not all fruit is created equal. Please see the list below, the fruit at the bottom is higher in sugars than the fruit at the top. Please avoid all dried fruits.      GIVE PT FOOD CHOICE LISTS FOR MEAL PLANNING  1)The amount of food you eat is important  -Too much can increase glucose levels and cause you to gain weight (which can  also increase glucose levels.  -Too little can decrease glucose level to unsafe levels (<70) 2)Eat meals and snacks at the same time each day to help your diabetes medication help you.  If you eat at different times each day, then the medication will not be as effective. 3)Do NOT skip meals or eat meals later than usual.  If you skip meals, then your glucose level can go low (<70).  Eating meals later than usual will not help the medication work effectively. 4) Amount of carbohydrates (carbs) per meal  -Breakfast- 30-45 grams of carbs  -Lunch and Dinner- 45-60 grams of carbs  -Snacks- 15-30 grams of carbs 5)Low fat foods have no more than 3 grams of fat per serving.  -Saturated- look for less than 1 gram per serving  -Trans Fat- look for 0 grams per serving 6)Exercise at least 120 minutes per week  Exercise Benefits:   -Lower LDL (Bad cholesterol)   -Lower blood pressure   -Increase  HDL (Good cholesterol)   -Strengthen heart, lungs and muscles   -Burn calories and relieve stress   -Sleep better and help you feel better overall  To lower your risk of heart disease, limit your intake of saturated fat and trans fat as much as possible. THE GOOD WHAT IT DOES WHERE IT'S FOUND  MONOUNSATURATED FAT Lowers LDL and maybe raises HDL cholesterol Canola oil, olives, olive oil, peanuts, peanut oil, avocados, nuts  POLYUNSATURATED FAT Lowers LDL cholesterol Corn, safflower, sunflower and soybean oils, nuts, seeds  OMEGA-3 FATTY ACIDS Lowers triglycerides (blood fats) and blood pressure Salmon, mackerel, herring, sardines, flax seed, flaxseed oil, walnuts, soybean oil  THE BAD WHAT IT DOES WHERE IT'S FOUND  SATURATED FAT Raises LDL (bad) cholesterol Butter, shortening, lard, red meat, cheese, whole milk, ice cream, coconut and palm oils  TRANS FAT Raises LDL cholesterol, lowers HDL (good) cholesterol Fried foods, some stick margarines, some cookies and crackers (look for hydrogenated fat on the ingredient list)  CHOLESTEROL FROM FOOD Too much may raise cholesterol levels Meat, poultry, seafood, eggs, milk, cheese, yogurt, butter   Plate Method (How much food of each food group) 1) Fill one half of your plate with nonstarchy vegetables: lettuce, broccoli, green beans, spinach, carrots or peppers. 2) Fill one quarter with protein: chicken, [place], fish, lean meat, eggs or tofu. 3) Fill one quarter with a nutritious  carbohydrate food: brown rice, whole-wheat pasta, whole-wheat bread, peas, or corn.  Choose whole-wheat carbs for extra nutrition.  Controlling carbs helps you control your blood glucose. 4) Include a small piece of fruit at each meal, as well as 8 ounces of lowfat milk or yogurt. 5) Add 1-2 teaspoons of heart-healthy fat, such as olive or canola oil, trans fat-free margarine, avocado, nuts or seeds.

## 2014-07-28 LAB — LIPID PANEL
CHOL/HDL RATIO: 5.5 ratio
Cholesterol: 160 mg/dL (ref 0–200)
HDL: 29 mg/dL — ABNORMAL LOW (ref >39)
LDL Cholesterol: 94 mg/dL (ref 0–99)
TRIGLYCERIDES: 183 mg/dL — AB (ref <150)
VLDL: 37 mg/dL (ref 0–40)

## 2014-07-28 LAB — HEPATIC FUNCTION PANEL
ALBUMIN: 4 g/dL (ref 3.5–5.2)
ALT: 14 U/L (ref 0–53)
AST: 19 U/L (ref 0–37)
Alkaline Phosphatase: 67 U/L (ref 39–117)
BILIRUBIN TOTAL: 0.5 mg/dL (ref 0.2–1.2)
Bilirubin, Direct: 0.1 mg/dL (ref 0.0–0.3)
Indirect Bilirubin: 0.4 mg/dL (ref 0.2–1.2)
Total Protein: 6.5 g/dL (ref 6.0–8.3)

## 2014-07-28 LAB — BASIC METABOLIC PANEL WITH GFR
BUN: 11 mg/dL (ref 6–23)
CHLORIDE: 105 meq/L (ref 96–112)
CO2: 27 meq/L (ref 19–32)
CREATININE: 1.06 mg/dL (ref 0.50–1.35)
Calcium: 8.8 mg/dL (ref 8.4–10.5)
GFR, EST NON AFRICAN AMERICAN: 81 mL/min
GFR, Est African American: >89 mL/min
Glucose, Bld: 89 mg/dL (ref 70–99)
POTASSIUM: 4.3 meq/L (ref 3.5–5.3)
Sodium: 140 mEq/L (ref 135–145)

## 2014-07-28 LAB — INSULIN, FASTING: INSULIN FASTING, SERUM: 12 u[IU]/mL (ref 2.0–19.6)

## 2014-07-28 LAB — VITAMIN D 25 HYDROXY (VIT D DEFICIENCY, FRACTURES): VIT D 25 HYDROXY: 26 ng/mL — AB (ref 30–100)

## 2014-07-28 LAB — HEMOGLOBIN A1C
Hgb A1c MFr Bld: 5.5 % (ref <5.7)
Mean Plasma Glucose: 111 mg/dL (ref <117)

## 2014-07-28 LAB — MAGNESIUM: Magnesium: 1.8 mg/dL (ref 1.5–2.5)

## 2014-08-05 ENCOUNTER — Encounter: Payer: 59 | Admitting: Gastroenterology

## 2014-08-06 ENCOUNTER — Other Ambulatory Visit: Payer: Self-pay | Admitting: Physician Assistant

## 2014-09-04 ENCOUNTER — Other Ambulatory Visit: Payer: Self-pay | Admitting: Physician Assistant

## 2014-09-15 ENCOUNTER — Other Ambulatory Visit: Payer: Self-pay | Admitting: Physician Assistant

## 2014-09-16 ENCOUNTER — Encounter: Payer: Self-pay | Admitting: Gastroenterology

## 2014-12-15 ENCOUNTER — Encounter: Payer: Self-pay | Admitting: Physician Assistant

## 2014-12-21 ENCOUNTER — Encounter: Payer: Self-pay | Admitting: Physician Assistant

## 2014-12-29 ENCOUNTER — Other Ambulatory Visit: Payer: Self-pay | Admitting: Internal Medicine

## 2015-01-16 ENCOUNTER — Other Ambulatory Visit: Payer: Self-pay | Admitting: Physician Assistant

## 2015-02-06 ENCOUNTER — Other Ambulatory Visit: Payer: Self-pay | Admitting: Internal Medicine

## 2015-03-01 ENCOUNTER — Encounter: Payer: Self-pay | Admitting: Physician Assistant

## 2015-03-01 ENCOUNTER — Ambulatory Visit (INDEPENDENT_AMBULATORY_CARE_PROVIDER_SITE_OTHER): Payer: 59 | Admitting: Physician Assistant

## 2015-03-01 VITALS — BP 126/76 | HR 72 | Temp 97.4°F | Resp 16 | Ht 73.5 in | Wt 277.4 lb

## 2015-03-01 DIAGNOSIS — K222 Esophageal obstruction: Secondary | ICD-10-CM

## 2015-03-01 DIAGNOSIS — Z0000 Encounter for general adult medical examination without abnormal findings: Secondary | ICD-10-CM | POA: Diagnosis not present

## 2015-03-01 DIAGNOSIS — Z79899 Other long term (current) drug therapy: Secondary | ICD-10-CM

## 2015-03-01 DIAGNOSIS — G4700 Insomnia, unspecified: Secondary | ICD-10-CM

## 2015-03-01 DIAGNOSIS — Z0001 Encounter for general adult medical examination with abnormal findings: Secondary | ICD-10-CM

## 2015-03-01 DIAGNOSIS — I10 Essential (primary) hypertension: Secondary | ICD-10-CM | POA: Diagnosis not present

## 2015-03-01 DIAGNOSIS — E785 Hyperlipidemia, unspecified: Secondary | ICD-10-CM

## 2015-03-01 DIAGNOSIS — R7303 Prediabetes: Secondary | ICD-10-CM

## 2015-03-01 DIAGNOSIS — K219 Gastro-esophageal reflux disease without esophagitis: Secondary | ICD-10-CM

## 2015-03-01 DIAGNOSIS — F419 Anxiety disorder, unspecified: Secondary | ICD-10-CM

## 2015-03-01 DIAGNOSIS — R0989 Other specified symptoms and signs involving the circulatory and respiratory systems: Secondary | ICD-10-CM | POA: Insufficient documentation

## 2015-03-01 DIAGNOSIS — M109 Gout, unspecified: Secondary | ICD-10-CM | POA: Insufficient documentation

## 2015-03-01 DIAGNOSIS — Z23 Encounter for immunization: Secondary | ICD-10-CM | POA: Diagnosis not present

## 2015-03-01 DIAGNOSIS — E669 Obesity, unspecified: Secondary | ICD-10-CM

## 2015-03-01 DIAGNOSIS — E559 Vitamin D deficiency, unspecified: Secondary | ICD-10-CM

## 2015-03-01 LAB — CBC WITH DIFFERENTIAL/PLATELET
BASOS ABS: 0 10*3/uL (ref 0.0–0.1)
Basophils Relative: 0 % (ref 0–1)
EOS ABS: 0.2 10*3/uL (ref 0.0–0.7)
EOS PCT: 3 % (ref 0–5)
HEMATOCRIT: 40.3 % (ref 39.0–52.0)
Hemoglobin: 13.3 g/dL (ref 13.0–17.0)
LYMPHS PCT: 24 % (ref 12–46)
Lymphs Abs: 1.9 10*3/uL (ref 0.7–4.0)
MCH: 28.7 pg (ref 26.0–34.0)
MCHC: 33 g/dL (ref 30.0–36.0)
MCV: 87 fL (ref 78.0–100.0)
MONO ABS: 0.6 10*3/uL (ref 0.1–1.0)
MPV: 9.2 fL (ref 8.6–12.4)
Monocytes Relative: 8 % (ref 3–12)
Neutro Abs: 5.2 10*3/uL (ref 1.7–7.7)
Neutrophils Relative %: 65 % (ref 43–77)
PLATELETS: 228 10*3/uL (ref 150–400)
RBC: 4.63 MIL/uL (ref 4.22–5.81)
RDW: 14 % (ref 11.5–15.5)
WBC: 8 10*3/uL (ref 4.0–10.5)

## 2015-03-01 NOTE — Patient Instructions (Addendum)
Vitamin D goal is between 60-80  Please make sure that you are taking your Vitamin D as directed.   It is very important as a natural anti-inflammatory   helping hair, skin, and nails, as well as reducing stroke and heart attack risk.   It helps your bones and helps with mood.  It also decreases numerous cancer risks so please take it as directed.   Low Vit D is associated with a 200-300% higher risk for CANCER   and 200-300% higher risk for HEART   ATTACK  &  STROKE.    .....................................Marland Kitchen  It is also associated with higher death rate at younger ages,   autoimmune diseases like Rheumatoid arthritis, Lupus, Multiple Sclerosis.     Also many other serious conditions, like depression, Alzheimer's  Dementia, infertility, muscle aches, fatigue, fibromyalgia - just to name a few.  +++++++++++++++++++  DO NOT DRINK YOUR CALORIES, NO SWEET TEA, REGULAR SODA  DRINK 80-100 OZ OF WATER IN A DAY  GETTING OFF OF PPI's    Nexium/protonix/prilosec/Omeprazole/Dexilant/Aciphex are called PPI's, they are great at healing your stomach but should only be taken for a short period of time.     Recent studies have shown that taken for a long time they  can increase the risk of osteoporosis (weakening of your bones), pneumonia, low magnesium, restless legs, Cdiff (infection that causes diarrhea), DEMENTIA and most recently kidney damage / disease / insufficiency.     Due to this information we want to try to stop the PPI but if you try to stop it abruptly this can cause rebound acid and worsening symptoms.   So this is how we want you to get off the PPI:  - Start taking the nexium/protonix/prilosec/PPI  every other day with  zantac (ranitidine) 2 x a day for 2-4 weeks  - then decrease the PPI to every 3 days while taking the zantac (ranitidine) twice a day the other  days for 2-4  Weeks  - then you can try the zantac (ranitidine) once at night or up to 2 x day as  needed.  - you can continue on this once at night or stop all together  - Avoid alcohol, spicy foods, NSAIDS (aleve, ibuprofen) at this time. See foods below.   +++++++++++++++++++++++++++++++++++++++++++  Food Choices for Gastroesophageal Reflux Disease  When you have gastroesophageal reflux disease (GERD), the foods you eat and your eating habits are very important. Choosing the right foods can help ease the discomfort of GERD. WHAT GENERAL GUIDELINES DO I NEED TO FOLLOW?  Choose fruits, vegetables, whole grains, low-fat dairy products, and low-fat meat, fish, and poultry.  Limit fats such as oils, salad dressings, butter, nuts, and avocado.  Keep a food diary to identify foods that cause symptoms.  Avoid foods that cause reflux. These may be different for different people.  Eat frequent small meals instead of three large meals each day.  Eat your meals slowly, in a relaxed setting.  Limit fried foods.  Cook foods using methods other than frying.  Avoid drinking alcohol.  Avoid drinking large amounts of liquids with your meals.  Avoid bending over or lying down until 2-3 hours after eating.   WHAT FOODS ARE NOT RECOMMENDED? The following are some foods and drinks that may worsen your symptoms:  Vegetables Tomatoes. Tomato juice. Tomato and spaghetti sauce. Chili peppers. Onion and garlic. Horseradish. Fruits Oranges, grapefruit, and lemon (fruit and juice). Meats High-fat meats, fish, and poultry. This includes hot dogs, ribs,  ham, sausage, salami, and bacon. Dairy Whole milk and chocolate milk. Sour cream. Cream. Butter. Ice cream. Cream cheese.  Beverages Coffee and tea, with or without caffeine. Carbonated beverages or energy drinks. Condiments Hot sauce. Barbecue sauce.  Sweets/Desserts Chocolate and cocoa. Donuts. Peppermint and spearmint. Fats and Oils High-fat foods, including [place] fries and potato chips. Other Vinegar. Strong spices, such as black  pepper, white pepper, red pepper, cayenne, curry powder, cloves, ginger, and chili powder. Nexium/protonix/prilosec are called PPI's, they are great at healing your stomach but should only be taken for a short period of time.   We want weight loss that will last so you should lose 1-2 pounds a week.  THAT IS IT! Please pick THREE things a month to change. Once it is a habit check off the item. Then pick another three items off the list to become habits.  If you are already doing a habit on the list GREAT!  Cross that item off! o Don't drink your calories. Ie, alcohol, soda, fruit juice, and sweet tea.  o Drink more water. Drink a glass when you feel hungry or before each meal.  o Eat breakfast - Complex carb and protein (likeDannon light and fit yogurt, oatmeal, fruit, eggs, [place] bacon). o Measure your cereal.  Eat no more than one cup a day. (ie [place]) o Eat an apple a day. o Add a vegetable a day. o Try a new vegetable a month. o Use Pam! Stop using oil or butter to cook. o Don't finish your plate or use smaller plates. o Share your dessert. o Eat sugar free Jello for dessert or frozen grapes. o Don't eat 2-3 hours before bed. o Switch to whole wheat bread, pasta, and brown rice. o Make healthier choices when you eat out. No fries! o Pick baked chicken, NOT fried. o Don't forget to SLOW DOWN when you eat. It is not going anywhere.  o Take the stairs. o Park far away in the parking lot o [employer] (or weights) for 10 minutes while watching TV. o Walk at work for 10 minutes during break. o Walk outside 1 time a week with your friend, kids, dog, or significant other. o Start a walking group at [place] the mall as much as you can tolerate.  o Keep a food diary. o Weigh yourself daily. o Walk for 15 minutes 3 days per week. o Cook at home more often and eat out less.  If life happens and you go back to old habits, it is okay.  Just start over. You can do it!   If you  experience chest pain, get short of breath, or tired during the exercise, please stop immediately and inform your doctor.   Before you even begin to attack a weight-loss plan, it pays to remember this: You are not fat. You have fat. Losing weight isn't about blame or shame; it's simply another achievement to accomplish. Dieting is like any other skill-you have to buckle down and work at it. As long as you act in a smart, reasonable way, you'll ultimately get where you want to be. Here are some weight loss pearls for you.  1. It's Not a Diet. It's a Lifestyle Thinking of a diet as something you're on and suffering through only for the short term doesn't work. To shed weight and keep it off, you need to make permanent changes to the way you eat. It's OK to indulge occasionally, of course, but if  you cut calories temporarily and then revert to your old way of eating, you'll gain back the weight quicker than you can say yo-yo. Use it to lose it. Research shows that one of the best predictors of long-term weight loss is how many pounds you drop in the first month. For that reason, nutritionists often suggest being stricter for the first two weeks of your new eating strategy to build momentum. Cut out added sugar and alcohol and avoid unrefined carbs. After that, figure out how you can reincorporate them in a way that's healthy and maintainable.  2. There's a Right Way to Exercise Working out burns calories and fat and boosts your metabolism by building muscle. But those trying to lose weight are notorious for overestimating the number of calories they burn and underestimating the amount they take in. Unfortunately, your system is biologically programmed to hold on to extra pounds and that means when you start exercising, your body senses the deficit and ramps up its hunger signals. If you're not diligent, you'll eat everything you burn and then some. Use it to lose it. Cardio gets all the exercise glory, but  strength and interval training are the real heroes. They help you build lean muscle, which in turn increases your metabolism and calorie-burning ability 3. Don't Overreact to Mild Hunger Some people have a hard time losing weight because of hunger anxiety. To them, being hungry is bad-something to be avoided at all costs-so they carry snacks with them and eat when they don't need to. Others eat because they're stressed out or bored. While you never want to get to the point of being ravenous (that's when bingeing is likely to happen), a hunger pang, a craving, or the fact that it's 3:00 p.m. should not send you racing for the vending machine or obsessing about the energy bar in your purse. Ideally, you should put off eating until your stomach is growling and it's difficult to concentrate.  Use it to lose it. When you feel the urge to eat, use the HALT method. Ask yourself, Am I really hungry? Or am I angry or anxious, lonely or bored, or tired? If you're still not certain, try the apple test. If you're truly hungry, an apple should seem delicious; if it doesn't, something else is going on. Or you can try drinking water and making yourself busy, if you are still hungry try a healthy snack.  4. Not All Calories Are Created Equal The mechanics of weight loss are pretty simple: Take in fewer calories than you use for energy. But the kind of food you eat makes all the difference. Processed food that's high in saturated fat and refined starch or sugar can cause inflammation that disrupts the hormone signals that tell your brain you're full. The result: You eat a lot more.  Use it to lose it. Clean up your diet. Swap in whole, unprocessed foods, including vegetables, lean protein, and healthy fats that will fill you up and give you the biggest nutritional bang for your calorie buck. In a few weeks, as your brain starts receiving regular hunger and fullness signals once again, you'll notice that you feel less hungry  overall and naturally start cutting back on the amount you eat.  5. Protein, Produce, and Plant-Based Fats Are Your Weight-Loss Trinity Here's why eating the three Ps regularly will help you drop pounds. Protein fills you up. You need it to build lean muscle, which keeps your metabolism humming so that you can torch  more fat. People in a weight-loss program who ate double the recommended daily allowance for protein (about 110 grams for a 150-pound woman) lost 70 percent of their weight from fat, while people who ate the RDA lost only about 40 percent, one study found. Produce is packed with filling fiber. "It's very difficult to consume too many calories if you're eating a lot of vegetables. Example: Three cups of broccoli is a lot of food, yet only 93 calories. (Fruit is another story. It can be easy to overeat and can contain a lot of calories from sugar, so be sure to monitor your intake.) Plant-based fats like olive oil and those in avocados and nuts are healthy and extra satiating.  Use it to lose it. Aim to incorporate each of the three Ps into every meal and snack. People who eat protein throughout the day are able to keep weight off, according to a study in the [place] of Clinical Nutrition. In addition to meat, poultry and seafood, good sources are beans, lentils, eggs, tofu, and yogurt. As for fat, keep portion sizes in check by measuring out salad dressing, oil, and nut butters (shoot for one to two tablespoons). Finally, eat veggies or a little fruit at every meal. People who did that consumed 308 fewer calories but didn't feel any hungrier than when they didn't eat more produce.  7. How You Eat Is As Important As What You Eat In order for your brain to register that you're full, you need to focus on what you're eating. Sit down whenever you eat, preferably at a table. Turn off the TV or computer, put down your phone, and look at your food. Smell it. Chew slowly, and don't put another  bite on your fork until you swallow. When women ate lunch this attentively, they consumed 30 percent less when snacking later than those who listened to an audiobook at lunchtime, according to a study in the [place] of Nutrition. 8. Weighing Yourself Really Works The scale provides the best evidence about whether your efforts are paying off. Seeing the numbers tick up or down or stagnate is motivation to keep going-or to rethink your approach. A 2015 study at [facility] found that daily weigh-ins helped people lose more weight, keep it off, and maintain that loss, even after two years. Use it to lose it. Step on the scale at the same time every day for the best results. If your weight shoots up several pounds from one weigh-in to the next, don't freak out. Eating a lot of salt the night before or having your period is the likely culprit. The number should return to normal in a day or two. It's a steady climb that you need to do something about. 9. Too Much Stress and Too Little Sleep Are Your Enemies When you're tired and frazzled, your body cranks up the production of cortisol, the stress hormone that can cause carb cravings. Not getting enough sleep also boosts your levels of ghrelin, a hormone associated with hunger, while suppressing leptin, a hormone that signals fullness and satiety. People on a diet who slept only five and a half hours a night for two weeks lost 55 percent less fat and were hungrier than those who slept eight and a half hours, according to a study in the [place]. Use it to lose it. Prioritize sleep, aiming for seven hours or more a night, which research shows helps lower stress. And make sure you're getting quality  zzz's. If a snoring spouse or a fidgety cat wakes you up frequently throughout the night, you may end up getting the equivalent of just four hours of sleep, according to a study from [facility]. Keep pets out of the  bedroom, and use a white-noise app to drown out snoring. 10. You Will Hit a plateau-And You Can Bust Through It As you slim down, your body releases much less leptin, the fullness hormone.  If you're not strength training, start right now. Building muscle can raise your metabolism to help you overcome a plateau. To keep your body challenged and burning calories, incorporate new moves and more intense intervals into your workouts or add another sweat session to your weekly routine. Alternatively, cut an extra 100 calories or so a day from your diet. Now that you've lost weight, your body simply doesn't need as much fuel.

## 2015-03-01 NOTE — Progress Notes (Signed)
Complete Physical  Assessment and Plan: 1. Hyperlipidemia -continue medications, check lipids, decrease fatty foods, increase activity.  - Lipid panel  2. Obesity Obesity with co morbidities- long discussion about weight loss, diet, and exercise - Testosterone  3. Prediabetes Discussed general issues about diabetes pathophysiology and management., Educational material distributed., Suggested low cholesterol diet., Encouraged aerobic exercise., Discussed foot care., Reminded to get yearly retinal exam. - Hemoglobin A1c - Insulin, fasting  4. Labile hypertension - continue medications, DASH diet, exercise and monitor at home. Call if greater than 130/80. - CBC with Differential/Platelet - BASIC METABOLIC PANEL WITH GFR - Hepatic function panel - TSH - Urinalysis, Routine w reflex microscopic (not at [facility]) - Microalbumin / creatinine urine ratio - EKG 12-Lead - [place], RETROPERITNL ABD,  LTD  5. Vitamin D deficiency Get on supplement - Vit D  25 hydroxy (rtn osteoporosis monitoring)  6. Encounter for long-term (current) use of medications - Magnesium  7. Anxiety Continue celexa, stress management techniques discussed, increase water, good sleep hygiene discussed, increase exercise, and increase veggies.   8. Insomnia Insomnia- good sleep hygiene discussed, increase day time activity, continue xanax PRN  9. ESOPHAGEAL STRICTURE Better, DUE FOR COLONOSCOPY  10. Gastroesophageal reflux disease, esophagitis presence not specified GERD- will try to get off PPiI given info for taper and zantac  11. Gilbert's syndrome Monitor labs.   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 Encounter for general adult medical examination with abnormal findings NEEDS COLONOSCOPY  14. Need for prophylactic vaccination with combined diphtheria-tetanus-pertussis (DTP) vaccine - Tdap vaccine greater than or equal to 7yo  IM   Discussed med's effects and SE's. Screening labs and tests as requested with regular follow-up as recommended.  HPI 49 y.o. male  presents for a complete physical. His blood pressure has been controlled at home, today their BP is BP: 126/76 mmHg He does not workout. He denies chest pain, shortness of breath, dizziness.  He is not on cholesterol medication and denies myalgias. His cholesterol is at goal. The cholesterol last visit was:   Lab Results  Component Value Date   CHOL 160 07/27/2014   HDL 29* 07/27/2014   LDLCALC 94 07/27/2014   TRIG 183* 07/27/2014   CHOLHDL 5.5 07/27/2014   He has had fluctuating A1C, first being in the preDM range in June 2015, but controls it with diet and exercise.  Lab Results  Component Value Date   HGBA1C 5.5 07/27/2014   Patient is NOT on Vitamin D supplement.  Lab Results  Component Value Date   VD25OH 26* 07/27/2014  .  Follows with Dr. Jeffie Pollock for elevated PSA, has had two negative biopsies.  No longer on B12.  He is on magnesium and fish oil.  He is on celexa 40 mg for anxiety and takes xanax 0,5mg  at night for sleep.  He is [place] has been separated for 1 year, with 2 kids (14,17 going to [employer]).  BMI is Body mass index is 36.1 kg/(m^2)., he is working on diet and exercise. Wt Readings from Last 3 Encounters:  03/01/15 277 lb 6.4 oz (125.828 kg)  07/27/14 264 lb (119.75 kg)  12/20/13 276 lb (125.193 kg)     Current Medications:  Current Outpatient Prescriptions on File Prior to Visit  Medication Sig Dispense Refill  . ALPRAZolam (XANAX) 0.5 MG tablet TAKE 1 TABLET BY MOUTH TWICE DAILY AS NEEDED 60 tablet 1  . Cholecalciferol (VITAMIN D PO) Take by  mouth daily.    . citalopram (CELEXA) 40 MG tablet TAKE 1 TABLET (40 MG TOTAL) BY MOUTH DAILY.((MAX PER INSURANCE IS 30 )) 90 tablet 0  . ibuprofen (ADVIL,MOTRIN) 200 MG tablet Take 200 mg by mouth 2 (two) times daily as needed for pain.    Marland Kitchen omeprazole (PRILOSEC) 40 MG  capsule TAKE ONE CAPSULE BY MOUTH EVERY DAY MAX 30 DAY FOR INSUR 90 capsule 1   No current facility-administered medications on file prior to visit.   Health Maintenance:  Immunization History  Administered Date(s) Administered  . Pneumococcal-Unspecified 11/06/1995  . Td 11/01/2004   Tetanus: 2006 DUE THIS YEAR Pneumovax: 1997 Prevnar 13: due at age 18 Flu vaccine: 2014 Zostavax: N/A DEXA: N/A Colonoscopy:2010, OVERDUE EGD: 2010 H/H Eye doctor:  Dr. Joya San, yearly Dentist: Dr. Ronald Pippins, q 6 months  Patient Care Team: Unk Pinto, MD as PCP - General (Internal Medicine) [name], MD as Attending Physician (Urology) Ladene Artist, MD as Consulting Physician (Gastroenterology) Kary Kos, MD as Consulting Physician (Neurosurgery)   Allergies: No Known Allergies Medical History:  Past Medical History  Diagnosis Date  . Hyperlipidemia   . Anxiety   . Hypertension   . GERD (gastroesophageal reflux disease)   . Gilbert's syndrome   . Allergy   . DDD (degenerative disc disease)   . Gout   . Anemia    Surgical History:  Past Surgical History  Procedure Laterality Date  . Wisdom tooth extraction Bilateral   . Cholesteatoma excision    . Vasectomy    . Cholecystectomy    . Prostrate    . Prostate biopsy     Family History:  Family History  Problem Relation Age of Onset  . Lung cancer Mother   . Diabetes Father   . Hypertension Father    Social History:   Social History  Substance Use Topics  . Smoking status: Never Smoker   . Smokeless tobacco: None  . Alcohol Use: None   Review of Systems  Constitutional: Negative.   HENT: Negative.   Eyes: Negative.   Respiratory: Negative.   Cardiovascular: Negative.   Gastrointestinal: Negative.   Genitourinary: Negative.   Musculoskeletal: Negative.   Skin: Negative.   Neurological: Negative.   Endo/Heme/Allergies: Negative.   Psychiatric/Behavioral: Negative.      Physical Exam: Estimated body  mass index is 36.1 kg/(m^2) as calculated from the following:   Height as of this encounter: 6' 1.5" (1.867 m).   Weight as of this encounter: 277 lb 6.4 oz (125.828 kg). BP 126/76 mmHg  Pulse 72  Temp(Src) 97.4 F (36.3 C)  Resp 16  Ht 6' 1.5" (1.867 m)  Wt 277 lb 6.4 oz (125.828 kg)  BMI 36.10 kg/m2 General Appearance: Well nourished, in no apparent distress. Eyes: PERRLA, EOMs, conjunctiva no swelling or erythema, normal fundi and vessels. Sinuses: No Frontal/maxillary tenderness ENT/Mouth: Ext aud canals clear, normal light reflex with TMs without erythema, bulging. Good dentition. No erythema, swelling, or exudate on post pharynx. Tonsils not swollen or erythematous. Hearing normal.  Neck: Supple, thyroid normal. No bruits Respiratory: Respiratory effort normal, BS equal bilaterally without rales, rhonchi, wheezing or stridor. Cardio: RRR without murmurs, rubs or gallops. Brisk peripheral pulses without edema.  Chest: symmetric, with normal excursions and percussion. Abdomen: Soft, +BS. Non tender, no guarding, rebound, hernias, masses, or organomegaly. .  Lymphatics: Non tender without lymphadenopathy.  Genitourinary: defer Dr. Jeffie Pollock Musculoskeletal: Full ROM all peripheral extremities,5/5 strength, and normal gait. Skin: Warm, dry without  rashes, lesions, ecchymosis. Center of back, 3x3 mm slightly irreg border, uniform color, will monitor.  Neuro: Cranial nerves intact, reflexes equal bilaterally. Normal muscle tone, no cerebellar symptoms. Sensation intact.  Psych: Awake and oriented X 3, normal affect, Insight and Judgment appropriate.   EKG: WNL no changes. AORTA SCAN: WNL  Vicie Mutters 2:11 PM

## 2015-03-02 LAB — URINALYSIS, ROUTINE W REFLEX MICROSCOPIC
Bilirubin Urine: NEGATIVE
Glucose, UA: NEGATIVE
HGB URINE DIPSTICK: NEGATIVE
KETONES UR: NEGATIVE
LEUKOCYTES UA: NEGATIVE
NITRITE: NEGATIVE
PROTEIN: NEGATIVE
Specific Gravity, Urine: 1.009 (ref 1.001–1.035)
pH: 5.5 (ref 5.0–8.0)

## 2015-03-02 LAB — VITAMIN D 25 HYDROXY (VIT D DEFICIENCY, FRACTURES): VIT D 25 HYDROXY: 26 ng/mL — AB (ref 30–100)

## 2015-03-02 LAB — MICROALBUMIN / CREATININE URINE RATIO: CREATININE, URINE: 77.1 mg/dL

## 2015-03-02 LAB — URIC ACID: Uric Acid, Serum: 8.1 mg/dL — ABNORMAL HIGH (ref 4.0–7.8)

## 2015-03-02 LAB — TESTOSTERONE: Testosterone: 213 ng/dL — ABNORMAL LOW (ref 300–890)

## 2015-03-02 LAB — HEPATIC FUNCTION PANEL
ALBUMIN: 4 g/dL (ref 3.6–5.1)
ALT: 17 U/L (ref 9–46)
AST: 18 U/L (ref 10–35)
Alkaline Phosphatase: 74 U/L (ref 40–115)
BILIRUBIN TOTAL: 0.4 mg/dL (ref 0.2–1.2)
Bilirubin, Direct: 0.1 mg/dL (ref <=0.2)
Indirect Bilirubin: 0.3 mg/dL (ref 0.2–1.2)
TOTAL PROTEIN: 6.3 g/dL (ref 6.1–8.1)

## 2015-03-02 LAB — LIPID PANEL
CHOLESTEROL: 177 mg/dL (ref 125–200)
HDL: 25 mg/dL — AB (ref >=40)
LDL Cholesterol: 96 mg/dL (ref <130)
TRIGLYCERIDES: 281 mg/dL — AB (ref <150)
Total CHOL/HDL Ratio: 7.1 Ratio — ABNORMAL HIGH (ref <=5.0)
VLDL: 56 mg/dL — ABNORMAL HIGH (ref <30)

## 2015-03-02 LAB — BASIC METABOLIC PANEL WITH GFR
BUN: 12 mg/dL (ref 7–25)
CALCIUM: 9 mg/dL (ref 8.6–10.3)
CO2: 26 mmol/L (ref 20–31)
Chloride: 102 mmol/L (ref 98–110)
Creat: 0.87 mg/dL (ref 0.70–1.33)
GLUCOSE: 103 mg/dL — AB (ref 65–99)
Potassium: 4.1 mmol/L (ref 3.5–5.3)
Sodium: 139 mmol/L (ref 135–146)

## 2015-03-02 LAB — HEMOGLOBIN A1C
Hgb A1c MFr Bld: 5.7 % — ABNORMAL HIGH (ref <5.7)
Mean Plasma Glucose: 117 mg/dL — ABNORMAL HIGH (ref <117)

## 2015-03-02 LAB — MAGNESIUM: MAGNESIUM: 2 mg/dL (ref 1.5–2.5)

## 2015-03-02 LAB — TSH: TSH: 1.871 u[IU]/mL (ref 0.350–4.500)

## 2015-03-02 LAB — INSULIN, FASTING: INSULIN FASTING, SERUM: 26.2 u[IU]/mL — AB (ref 2.0–19.6)

## 2015-03-23 ENCOUNTER — Other Ambulatory Visit: Payer: Self-pay | Admitting: Internal Medicine

## 2015-05-16 ENCOUNTER — Encounter: Payer: Self-pay | Admitting: Gastroenterology

## 2015-05-25 ENCOUNTER — Other Ambulatory Visit: Payer: Self-pay | Admitting: Physician Assistant

## 2015-05-29 NOTE — Telephone Encounter (Signed)
Rx called into [facility] pharmacy. 

## 2015-06-15 DIAGNOSIS — D126 Benign neoplasm of colon, unspecified: Secondary | ICD-10-CM

## 2015-06-15 HISTORY — DX: Benign neoplasm of colon, unspecified: D12.6

## 2015-07-03 ENCOUNTER — Ambulatory Visit (AMBULATORY_SURGERY_CENTER): Payer: Self-pay

## 2015-07-03 VITALS — Ht 74.0 in | Wt 273.0 lb

## 2015-07-03 DIAGNOSIS — Z1211 Encounter for screening for malignant neoplasm of colon: Secondary | ICD-10-CM

## 2015-07-03 MED ORDER — NA SULFATE-K SULFATE-MG SULF 17.5-3.13-1.6 GM/177ML PO SOLN: 1.0000 | Freq: Once | ORAL | Status: DC

## 2015-07-03 NOTE — Progress Notes (Signed)
No egg or soy allergies Not on home 02 No previous anesthesia complications Emmi video declined. No diet or weight loss meds 

## 2015-07-07 ENCOUNTER — Encounter: Payer: Self-pay | Admitting: Gastroenterology

## 2015-07-07 ENCOUNTER — Ambulatory Visit (AMBULATORY_SURGERY_CENTER): Payer: 59 | Admitting: Gastroenterology

## 2015-07-07 VITALS — BP 106/72 | HR 75 | Temp 97.0°F | Resp 14 | Ht 74.0 in | Wt 273.0 lb

## 2015-07-07 DIAGNOSIS — D123 Benign neoplasm of transverse colon: Secondary | ICD-10-CM

## 2015-07-07 DIAGNOSIS — Z1211 Encounter for screening for malignant neoplasm of colon: Secondary | ICD-10-CM | POA: Diagnosis not present

## 2015-07-07 MED ORDER — SODIUM CHLORIDE 0.9 % IV SOLN: 500.0000 mL | INTRAVENOUS | Status: DC

## 2015-07-07 NOTE — Progress Notes (Signed)
Called to room to assist during endoscopic procedure.  Patient ID and intended procedure confirmed with present staff. Received instructions for my participation in the procedure from the performing physician.  

## 2015-07-07 NOTE — Progress Notes (Signed)
To recovery, report to Myers, RN, VSS. 

## 2015-07-07 NOTE — Patient Instructions (Signed)
YOU HAD AN ENDOSCOPIC PROCEDURE TODAY AT THE [facility]:   Refer to the procedure report that was given to you for any specific questions about what was found during the examination.  If the procedure report does not answer your questions, please call your gastroenterologist to clarify.  If you requested that your care partner not be given the details of your procedure findings, then the procedure report has been included in a sealed envelope for you to review at your convenience later.  YOU SHOULD EXPECT: Some feelings of bloating in the abdomen. Passage of more gas than usual.  Walking can help get rid of the air that was put into your GI tract during the procedure and reduce the bloating. If you had a lower endoscopy (such as a colonoscopy or flexible sigmoidoscopy) you may notice spotting of blood in your stool or on the toilet paper. If you underwent a bowel prep for your procedure, you may not have a normal bowel movement for a few days.  Please Note:  You might notice some irritation and congestion in your nose or some drainage.  This is from the oxygen used during your procedure.  There is no need for concern and it should clear up in a day or so.  SYMPTOMS TO REPORT IMMEDIATELY:   Following lower endoscopy (colonoscopy or flexible sigmoidoscopy):  Excessive amounts of blood in the stool  Significant tenderness or worsening of abdominal pains  Swelling of the abdomen that is new, acute  Fever of 100F or higher    For urgent or emergent issues, a gastroenterologist can be reached at any hour by calling [phone].   DIET: Your first meal following the procedure should be a small meal and then it is ok to progress to your normal diet. Heavy or fried foods are harder to digest and may make you feel nauseous or bloated.  Likewise, meals heavy in dairy and vegetables can increase bloating.  Drink plenty of fluids but you should avoid alcoholic beverages for 24  hours.  ACTIVITY:  You should plan to take it easy for the rest of today and you should NOT DRIVE or use heavy machinery until tomorrow (because of the sedation medicines used during the test).    FOLLOW UP: Our staff will call the number listed on your records the next business day following your procedure to check on you and address any questions or concerns that you may have regarding the information given to you following your procedure. If we do not reach you, we will leave a message.  However, if you are feeling well and you are not experiencing any problems, there is no need to return our call.  We will assume that you have returned to your regular daily activities without incident.  If any biopsies were taken you will be contacted by phone or by letter within the next 1-3 weeks.  Please call us at [phone] if you have not heard about the biopsies in 3 weeks.    SIGNATURES/CONFIDENTIALITY: You and/or your care partner have signed paperwork which will be entered into your electronic medical record.  These signatures attest to the fact that that the information above on your After Visit Summary has been reviewed and is understood.  Full responsibility of the confidentiality of this discharge information lies with you and/or your care-partner.  Next colonoscopy determined by pathology results; 5 or 10 years. Please review polyp handout provided.   

## 2015-07-07 NOTE — Op Note (Signed)
[facility]  [address] [place], [id number]   COLONOSCOPY PROCEDURE REPORT  PATIENT: [name], [name]  MR#: [id number] BIRTHDATE: Sep 23, 1965 , 49  yrs. old GENDER: male ENDOSCOPIST: Ladene Artist, MD, Marval Regal REFERRED BY: Unk Pinto, M.D. PROCEDURE DATE:  07/07/2015 PROCEDURE:   Colonoscopy, screening and Colonoscopy with snare polypectomy First Screening Colonoscopy - Avg.  risk and is 50 yrs.  old or older Yes.  Prior Negative Screening - Now for repeat screening. N/A  History of Adenoma - Now for follow-up colonoscopy & has been > or = to 3 yrs.  N/A  Polyps removed today? Yes ASA CLASS:   Class II INDICATIONS:Screening for colonic neoplasia and Colorectal Neoplasm Risk Assessment for this procedure is average risk. MEDICATIONS: Monitored anesthesia care and Propofol 250 mg IV DESCRIPTION OF PROCEDURE:   After the risks benefits and alternatives of the procedure were thoroughly explained, informed consent was obtained.  The digital rectal exam revealed no abnormalities of the rectum.   The LB [id number]  endoscope was introduced through the anus and advanced to the cecum, which was identified by both the appendix and ileocecal valve. No adverse events experienced.   The quality of the prep was excellent. (Suprep was used)  The instrument was then slowly withdrawn as the colon was fully examined. Estimated blood loss is zero unless otherwise noted in this procedure report.    COLON FINDINGS: Two sessile polyps measuring 6 mm in size were found in the transverse colon.  Polypectomies were performed with a cold snare.  The resection was complete, the polyp tissue was completely retrieved and sent to histology.   The colonic mucosa appeared normal in the sigmoid colon, descending colon, rectum, at the splenic flexure, ileocecal valve, cecum, hepatic flexure, and in the ascending colon.  Retroflexed views revealed internal Grade I hemorrhoids. The  time to cecum = 1.4 Withdrawal time = 13.5   The scope was withdrawn and the procedure completed. COMPLICATIONS: There were no immediate complications.  ENDOSCOPIC IMPRESSION: 1.   Two sessile polyps in the transverse colon; polypectomies performed with a cold snare 2.   The colonic mucosa otherwise appeared normal  RECOMMENDATIONS: 1.  Await pathology results 2.  Repeat colonoscopy in 5 years if polyp(s) adenomatous; otherwise 10 years  eSigned:  Ladene Artist, MD, [facility] 07/07/2015 9:15 AM

## 2015-07-11 ENCOUNTER — Telehealth: Payer: Self-pay | Admitting: *Deleted

## 2015-07-11 NOTE — Telephone Encounter (Signed)
  Follow up Call-  Call back number 07/07/2015  Post procedure Call Back phone  # [phone]  Permission to leave phone message Yes     Patient questions:  Do you have a fever, pain , or abdominal swelling? No. Pain Score  0 *  Have you tolerated food without any problems? Yes.    Have you been able to return to your normal activities? Yes.    Do you have any questions about your discharge instructions: Diet   No. Medications  No. Follow up visit  No.  Do you have questions or concerns about your Care? No.  Actions: * If pain score is 4 or above: No action needed, pain <4.

## 2015-07-19 ENCOUNTER — Encounter: Payer: Self-pay | Admitting: Gastroenterology

## 2015-07-24 ENCOUNTER — Encounter: Payer: Self-pay | Admitting: Gastroenterology

## 2015-08-03 ENCOUNTER — Other Ambulatory Visit: Payer: Self-pay | Admitting: Physician Assistant

## 2015-08-04 NOTE — Telephone Encounter (Signed)
Rx called in 

## 2015-08-25 ENCOUNTER — Emergency Department (HOSPITAL_BASED_OUTPATIENT_CLINIC_OR_DEPARTMENT_OTHER)
Admission: EM | Admit: 2015-08-25 | Discharge: 2015-08-25 | Disposition: A | Discharge status: Home / Self Care | Payer: 59 | Attending: Physician Assistant | Admitting: Physician Assistant

## 2015-08-25 ENCOUNTER — Encounter (HOSPITAL_BASED_OUTPATIENT_CLINIC_OR_DEPARTMENT_OTHER): Payer: Self-pay | Admitting: Emergency Medicine

## 2015-08-25 DIAGNOSIS — F419 Anxiety disorder, unspecified: Secondary | ICD-10-CM | POA: Diagnosis not present

## 2015-08-25 DIAGNOSIS — K219 Gastro-esophageal reflux disease without esophagitis: Secondary | ICD-10-CM | POA: Insufficient documentation

## 2015-08-25 DIAGNOSIS — Z8639 Personal history of other endocrine, nutritional and metabolic disease: Secondary | ICD-10-CM | POA: Insufficient documentation

## 2015-08-25 DIAGNOSIS — Z79899 Other long term (current) drug therapy: Secondary | ICD-10-CM | POA: Diagnosis not present

## 2015-08-25 DIAGNOSIS — M109 Gout, unspecified: Secondary | ICD-10-CM

## 2015-08-25 DIAGNOSIS — Z862 Personal history of diseases of the blood and blood-forming organs and certain disorders involving the immune mechanism: Secondary | ICD-10-CM | POA: Insufficient documentation

## 2015-08-25 DIAGNOSIS — L539 Erythematous condition, unspecified: Secondary | ICD-10-CM | POA: Diagnosis not present

## 2015-08-25 DIAGNOSIS — M10071 Idiopathic gout, right ankle and foot: Secondary | ICD-10-CM | POA: Insufficient documentation

## 2015-08-25 DIAGNOSIS — M79671 Pain in right foot: Secondary | ICD-10-CM | POA: Diagnosis present

## 2015-08-25 MED ORDER — OXYCODONE-ACETAMINOPHEN 5-325 MG PO TABS: 1.0000 | ORAL_TABLET | Freq: Four times a day (QID) | ORAL | Status: DC | PRN

## 2015-08-25 MED ORDER — COLCHICINE 0.6 MG PO TABS: 0.6000 mg | ORAL_TABLET | Freq: Every day | ORAL | Status: DC

## 2015-08-25 MED ORDER — INDOMETHACIN 25 MG PO CAPS: 25.0000 mg | ORAL_CAPSULE | Freq: Three times a day (TID) | ORAL | Status: DC | PRN

## 2015-08-25 MED FILL — OXYCODONE/APAP 5-325: 5-325 | 2 days supply | Qty: 11 | Fill #0

## 2015-08-25 MED FILL — COLCHICINE 0.6 MG TABLET: 0.6 | 20 days supply | Qty: 20 | Fill #0

## 2015-08-25 MED FILL — INDOMETHACIN 25 MG CAPSULE: 25 | 10 days supply | Qty: 30 | Fill #0

## 2015-08-25 NOTE — Discharge Instructions (Signed)
Please use indomethacin (with food).  You may also try colchicine if not improving. Be wary that colchicine can cause stomach upset. We gave you a low dose of colchicine.  Gout Gout is an inflammatory arthritis caused by a buildup of uric acid crystals in the joints. Uric acid is a chemical that is normally present in the blood. When the level of uric acid in the blood is too high it can form crystals that deposit in your joints and tissues. This causes joint redness, soreness, and swelling (inflammation). Repeat attacks are common. Over time, uric acid crystals can form into masses (tophi) near a joint, destroying bone and causing disfigurement. Gout is treatable and often preventable. CAUSES  The disease begins with elevated levels of uric acid in the blood. Uric acid is produced by your body when it breaks down a naturally found substance called purines. Certain foods you eat, such as meats and fish, contain high amounts of purines. Causes of an elevated uric acid level include:  Being passed down from parent to child (heredity).  Diseases that cause increased uric acid production (such as obesity, psoriasis, and certain cancers).  Excessive alcohol use.  Diet, especially diets rich in meat and seafood.  Medicines, including certain cancer-fighting medicines (chemotherapy), water pills (diuretics), and aspirin.  Chronic kidney disease. The kidneys are no longer able to remove uric acid well.  Problems with metabolism. Conditions strongly associated with gout include:  Obesity.  High blood pressure.  High cholesterol.  Diabetes. Not everyone with elevated uric acid levels gets gout. It is not understood why some people get gout and others do not. Surgery, joint injury, and eating too much of certain foods are some of the factors that can lead to gout attacks. SYMPTOMS   An attack of gout comes on quickly. It causes intense pain with redness, swelling, and warmth in a joint.  Fever  can occur.  Often, only one joint is involved. Certain joints are more commonly involved:  Base of the big toe.  Knee.  Ankle.  Wrist.  Finger. Without treatment, an attack usually goes away in a few days to weeks. Between attacks, you usually will not have symptoms, which is different from many other forms of arthritis. DIAGNOSIS  Your caregiver will suspect gout based on your symptoms and exam. In some cases, tests may be recommended. The tests may include:  Blood tests.  Urine tests.  X-rays.  Joint fluid exam. This exam requires a needle to remove fluid from the joint (arthrocentesis). Using a microscope, gout is confirmed when uric acid crystals are seen in the joint fluid. TREATMENT  There are two phases to gout treatment: treating the sudden onset (acute) attack and preventing attacks (prophylaxis).  Treatment of an Acute Attack.  Medicines are used. These include anti-inflammatory medicines or steroid medicines.  An injection of steroid medicine into the affected joint is sometimes necessary.  The painful joint is rested. Movement can worsen the arthritis.  You may use warm or cold treatments on painful joints, depending which works best for you.  Treatment to Prevent Attacks.  If you suffer from frequent gout attacks, your caregiver may advise preventive medicine. These medicines are started after the acute attack subsides. These medicines either help your kidneys eliminate uric acid from your body or decrease your uric acid production. You may need to stay on these medicines for a very long time.  The early phase of treatment with preventive medicine can be associated with an increase in  acute gout attacks. For this reason, during the first few months of treatment, your caregiver may also advise you to take medicines usually used for acute gout treatment. Be sure you understand your caregiver's directions. Your caregiver may make several adjustments to your medicine  dose before these medicines are effective.  Discuss dietary treatment with your caregiver or dietitian. Alcohol and drinks high in sugar and fructose and foods such as meat, poultry, and seafood can increase uric acid levels. Your caregiver or dietitian can advise you on drinks and foods that should be limited. HOME CARE INSTRUCTIONS   Do not take aspirin to relieve pain. This raises uric acid levels.  Only take over-the-counter or prescription medicines for pain, discomfort, or fever as directed by your caregiver.  Rest the joint as much as possible. When in bed, keep sheets and blankets off painful areas.  Keep the affected joint raised (elevated).  Apply warm or cold treatments to painful joints. Use of warm or cold treatments depends on which works best for you.  Use crutches if the painful joint is in your leg.  Drink enough fluids to keep your urine clear or pale yellow. This helps your body get rid of uric acid. Limit alcohol, sugary drinks, and fructose drinks.  Follow your dietary instructions. Pay careful attention to the amount of protein you eat. Your daily diet should emphasize fruits, vegetables, whole grains, and fat-free or low-fat milk products. Discuss the use of coffee, vitamin C, and cherries with your caregiver or dietitian. These may be helpful in lowering uric acid levels.  Maintain a healthy body weight. SEEK MEDICAL CARE IF:   You develop diarrhea, vomiting, or any side effects from medicines.  You do not feel better in 24 hours, or you are getting worse. SEEK IMMEDIATE MEDICAL CARE IF:   Your joint becomes suddenly more tender, and you have chills or a fever. MAKE SURE YOU:   Understand these instructions.  Will watch your condition.  Will get help right away if you are not doing well or get worse.   This information is not intended to replace advice given to you by your health care provider. Make sure you discuss any questions you have with your health  care provider.   Document Released: 06/28/2000 Document Revised: 07/22/2014 Document Reviewed: 02/12/2012 Elsevier Interactive Patient Education 2016 [place] are compounds that affect the level of uric acid in your body. A low-purine diet is a diet that is low in purines. Eating a low-purine diet can prevent the level of uric acid in your body from getting too high and causing gout or kidney stones or both. WHAT DO I NEED TO KNOW ABOUT THIS DIET?  Choose low-purine foods. Examples of low-purine foods are listed in the next section.  Drink plenty of fluids, especially water. Fluids can help remove uric acid from your body. Try to drink 8-16 cups (1.9-3.8 L) a day.  Limit foods high in fat, especially saturated fat, as fat makes it harder for the body to get rid of uric acid. Foods high in saturated fat include pizza, cheese, ice cream, whole milk, fried foods, and gravies. Choose foods that are lower in fat and lean sources of protein. Use olive oil when cooking as it contains healthy fats that are not high in saturated fat.  Limit alcohol. Alcohol interferes with the elimination of uric acid from your body. If you are having a gout attack, avoid all alcohol.  Keep in mind  that different people's bodies react differently to different foods. You will probably learn over time which foods do or do not affect you. If you discover that a food tends to cause your gout to flare up, avoid eating that food. You can more freely enjoy foods that do not cause problems. If you have any questions about a food item, talk to your dietitian or health care provider. WHICH FOODS ARE LOW, MODERATE, AND HIGH IN PURINES? The following is a list of foods that are low, moderate, and high in purines. You can eat any amount of the foods that are low in purines. You may be able to have small amounts of foods that are moderate in purines. Ask your health care provider how much of a food  moderate in purines you can have. Avoid foods high in purines. Grains  Foods low in purines: Enriched white bread, pasta, rice, cake, cornbread, popcorn.  Foods moderate in purines: Whole-grain breads and cereals, wheat germ, bran, oatmeal. Uncooked oatmeal. Dry wheat bran or wheat germ.  Foods high in purines: Pancakes, [place] toast, biscuits, muffins. Vegetables  Foods low in purines: All vegetables, except those that are moderate in purines.  Foods moderate in purines: Asparagus, cauliflower, spinach, mushrooms, green peas. Fruits  All fruits are low in purines. Meats and other Protein Foods  Foods low in purines: Eggs, nuts, peanut butter.  Foods moderate in purines: 80-90% lean beef, lamb, veal, pork, poultry, fish, eggs, peanut butter, nuts. Crab, lobster, oysters, and shrimp. Cooked dried beans, peas, and lentils.  Foods high in purines: Anchovies, sardines, herring, mussels, tuna, codfish, scallops, trout, and haddock. Berniece Salines. Organ meats (such as liver or kidney). Tripe. Game meat. Goose. Sweetbreads. Dairy  All dairy foods are low in purines. Low-fat and fat-free dairy products are best because they are low in saturated fat. Beverages  Drinks low in purines: Water, carbonated beverages, tea, coffee, cocoa.  Drinks moderate in purines: Soft drinks and other drinks sweetened with high-fructose corn syrup. Juices. To find whether a food or drink is sweetened with high-fructose corn syrup, look at the ingredients list.  Drinks high in purines: Alcoholic beverages (such as beer). Condiments  Foods low in purines: Salt, herbs, olives, pickles, relishes, vinegar.  Foods moderate in purines: Butter, margarine, oils, mayonnaise. Fats and Oils  Foods low in purines: All types, except gravies and sauces made with meat.  Foods high in purines: Gravies and sauces made with meat. Other Foods  Foods low in purines: Sugars, sweets, gelatin. Cake. Soups made without  meat.  Foods moderate in purines: Meat-based or fish-based soups, broths, or bouillons. Foods and drinks sweetened with high-fructose corn syrup.  Foods high in purines: High-fat desserts (such as ice cream, cookies, cakes, pies, doughnuts, and chocolate). Contact your dietitian for more information on foods that are not listed here.   This information is not intended to replace advice given to you by your health care provider. Make sure you discuss any questions you have with your health care provider.   Document Released: 10/26/2010 Document Revised: 07/06/2013 Document Reviewed: 06/07/2013 Elsevier Interactive Patient Education [employer].

## 2015-08-25 NOTE — ED Notes (Signed)
Pt developed pain to right big toe on Sunday, thought it was related to his gout, pain improved until this am when he awoke with pain spreading across the top of his foot

## 2015-08-25 NOTE — ED Provider Notes (Signed)
CSN: [id number]     Arrival date & time 08/25/15  [id number] History   First MD Initiated Contact with Patient 08/25/15 [phone]     Chief Complaint  Patient presents with  . Foot Pain     (Consider location/radiation/quality/duration/timing/severity/associated sxs/prior Treatment) HPI   Patient is a 50 year old male with history of gout presenting with gout in his right foot. Patient usuallygets gout in either his right first toe or right MCP. This flare started on Sunday. It got better on Tuesday and then got worse yesterday. Patient's been taking ibuprofen without effect. Patient has no systemic symptoms. No fever. This feels like his normal gout.  Past Medical History  Diagnosis Date  . Hyperlipidemia   . Anxiety   . Hypertension   . GERD (gastroesophageal reflux disease)   . Gilbert's syndrome     pt denies dx  . Allergy   . DDD (degenerative disc disease)   . Gout   . Anemia    Past Surgical History  Procedure Laterality Date  . Wisdom tooth extraction Bilateral   . Cholesteatoma excision    . Vasectomy    . Cholecystectomy    . Prostrate    . Prostate biopsy    . Upper gastrointestinal endoscopy  2011   Family History  Problem Relation Age of Onset  . Lung cancer Mother   . Diabetes Father   . Hypertension Father   . Colon cancer Neg Hx    Social History  Substance Use Topics  . Smoking status: Never Smoker   . Smokeless tobacco: Never Used  . Alcohol Use: 0.0 oz/week    0 Standard drinks or equivalent per week     Comment: social    Review of Systems  Constitutional: Negative for fever and activity change.  Respiratory: Negative for shortness of breath.   Cardiovascular: Negative for chest pain.  Gastrointestinal: Negative for nausea, vomiting and abdominal pain.      Allergies  Review of patient's allergies indicates no known allergies.  Home Medications   Prior to Admission medications   Medication Sig Start Date End Date Taking? Authorizing  Provider  ALPRAZolam Duanne Moron) 0.5 MG tablet TAKE 1 TABLET BY MOUTH TWICE DAILY 08/04/15  Yes Vicie Mutters, PA-C  citalopram (CELEXA) 40 MG tablet TAKE 1 TABLET (40 MG TOTAL) BY MOUTH DAILY.((MAX PER INSURANCE IS 30 )) 03/23/15  Yes Unk Pinto, MD  ibuprofen (ADVIL,MOTRIN) 200 MG tablet Take 200 mg by mouth 2 (two) times daily as needed for pain.   Yes Historical Provider, MD  RaNITidine HCl (ZANTAC 150 MAXIMUM STRENGTH PO) Take 150 mg by mouth 2 (two) times daily.   Yes Historical Provider, MD  Cholecalciferol (VITAMIN D PO) Take by mouth daily.    Historical Provider, MD  colchicine 0.6 MG tablet Take 1 tablet (0.6 mg total) by mouth daily. 08/25/15   Reianna Batdorf Lyn Sander Speckman, MD  indomethacin (INDOCIN) 25 MG capsule Take 1 capsule (25 mg total) by mouth 3 (three) times daily as needed. 08/25/15   Avalon Coppinger Lyn Tarissa Kerin, MD  oxyCODONE-acetaminophen (PERCOCET/ROXICET) 5-325 MG tablet Take 1 tablet by mouth every 6 (six) hours as needed for severe pain. 08/25/15   Sencere Symonette Lyn Yader Criger, MD   BP 140/81 mmHg  Pulse 76  Temp(Src) 98.1 F (36.7 C) (Oral)  Resp 20  Ht 6\' 2"  (1.88 m)  Wt 270 lb (122.471 kg)  BMI 34.65 kg/m2  SpO2 100% Physical Exam  Constitutional: He is oriented to person, place, and time. He appears  well-nourished.  HENT:  Head: Normocephalic.  Eyes: Conjunctivae are normal.  Cardiovascular: Normal rate.   Pulmonary/Chest: Effort normal and breath sounds normal. He has no wheezes.  Musculoskeletal:  Mild erythema to first MTP. Tenderness to the first MTP movement. Mild tenderness to second and third MTP movement.  Neurological: He is oriented to person, place, and time.  Skin: Skin is warm and dry. He is not diaphoretic.  Psychiatric: He has a normal mood and affect. His behavior is normal.    ED Course  Procedures (including critical care time) Labs Review Labs Reviewed - No data to display  Imaging Review No results found. I have personally reviewed and evaluated  these images and lab results as part of my medical decision-making.   EKG Interpretation None      MDM   Final diagnoses:  None   patient is a very pleasant 50 year old male with history of gout. Patient's presenting with gout flare. Patient's had no history of ulcers or epigastric issues in the past. We'll treat with indomethacin 3 times daily. We'll also give him a colchicine prescription to use as needed. (we warned about side effects, and he will use it if gout does not improve with indomethacin) We'll have him follow-up with his primary care physician next week.  We expressed return precautions such as fever, spreading erythema or other concerns.    Kemoni Quesenberry Julio Alm, MD 08/25/15 [phone]

## 2015-09-18 ENCOUNTER — Other Ambulatory Visit: Payer: Self-pay | Admitting: *Deleted

## 2015-09-18 MED ORDER — CITALOPRAM HYDROBROMIDE 40 MG PO TABS: ORAL_TABLET | ORAL | Status: DC

## 2015-09-28 ENCOUNTER — Encounter: Payer: Self-pay | Admitting: Internal Medicine

## 2015-09-28 ENCOUNTER — Ambulatory Visit (INDEPENDENT_AMBULATORY_CARE_PROVIDER_SITE_OTHER): Payer: 59 | Admitting: Internal Medicine

## 2015-09-28 VITALS — BP 126/70 | HR 80 | Temp 98.2°F | Resp 18 | Ht 73.5 in | Wt 274.0 lb

## 2015-09-28 DIAGNOSIS — I10 Essential (primary) hypertension: Secondary | ICD-10-CM

## 2015-09-28 DIAGNOSIS — E291 Testicular hypofunction: Secondary | ICD-10-CM | POA: Diagnosis not present

## 2015-09-28 DIAGNOSIS — Z79899 Other long term (current) drug therapy: Secondary | ICD-10-CM

## 2015-09-28 DIAGNOSIS — E559 Vitamin D deficiency, unspecified: Secondary | ICD-10-CM

## 2015-09-28 DIAGNOSIS — E785 Hyperlipidemia, unspecified: Secondary | ICD-10-CM

## 2015-09-28 DIAGNOSIS — E349 Endocrine disorder, unspecified: Secondary | ICD-10-CM

## 2015-09-28 DIAGNOSIS — R7303 Prediabetes: Secondary | ICD-10-CM

## 2015-09-28 DIAGNOSIS — R0989 Other specified symptoms and signs involving the circulatory and respiratory systems: Secondary | ICD-10-CM

## 2015-09-28 MED ORDER — ALPRAZOLAM 0.5 MG PO TABS: 0.5000 mg | ORAL_TABLET | Freq: Two times a day (BID) | ORAL | Status: DC | PRN

## 2015-09-28 MED ORDER — CITALOPRAM HYDROBROMIDE 40 MG PO TABS: ORAL_TABLET | ORAL | Status: DC

## 2015-09-28 NOTE — Progress Notes (Signed)
Assessment and Plan:  Hypertension:  -Continue medication,  -monitor blood pressure at home.  -Continue DASH diet.   -Reminder to go to the ER if any CP, SOB, nausea, dizziness, severe HA, changes vision/speech, left arm numbness and tingling, and jaw pain.  Cholesterol: -Continue diet and exercise.  -Check cholesterol.   Pre-diabetes: -Continue diet and exercise.  -Check A1C  Vitamin D Def: -check level -continue medications.   Anxiety -well controlled -still having issues with sleeping -cont xanax prn for sleep -refill given today  Continue diet and meds as discussed. Further disposition pending results of labs.  HPI 50 y.o. male  presents for 3 month follow up with hypertension, hyperlipidemia, prediabetes and vitamin D.   His blood pressure has been controlled at home, today their BP is BP: 126/70 mmHg.   He does workout. He denies chest pain, shortness of breath, dizziness.  He is going to the gym lately.  He is doing more cardio activity.     He is not on cholesterol medication and denies myalgias. His cholesterol is at goal. The cholesterol last visit was:   Lab Results  Component Value Date   CHOL 177 03/01/2015   HDL 25* 03/01/2015   LDLCALC 96 03/01/2015   TRIG 281* 03/01/2015   CHOLHDL 7.1* 03/01/2015     He has been working on diet and exercise for prediabetes, and denies foot ulcerations, hyperglycemia, hypoglycemia , increased appetite, nausea, paresthesia of the feet, polydipsia, polyuria, visual disturbances, vomiting and weight loss. Last A1C in the office was:  Lab Results  Component Value Date   HGBA1C 5.7* 03/01/2015    Patient is on Vitamin D supplement.  Lab Results  Component Value Date   VD25OH 26* 03/01/2015       Current Medications:  Current Outpatient Prescriptions on File Prior to Visit  Medication Sig Dispense Refill  . ALPRAZolam (XANAX) 0.5 MG tablet TAKE 1 TABLET BY MOUTH TWICE DAILY 60 tablet 1  . Cholecalciferol (VITAMIN D  PO) Take by mouth daily.    . citalopram (CELEXA) 40 MG tablet TAKE 1 TABLET (40 MG TOTAL) BY MOUTH DAILY.((MAX PER INSURANCE IS 30 )) 90 tablet 3  . RaNITidine HCl (ZANTAC 150 MAXIMUM STRENGTH PO) Take 150 mg by mouth 2 (two) times daily.     No current facility-administered medications on file prior to visit.    Medical History:  Past Medical History  Diagnosis Date  . Hyperlipidemia   . Anxiety   . Hypertension   . GERD (gastroesophageal reflux disease)   . Gilbert's syndrome     pt denies dx  . Allergy   . DDD (degenerative disc disease)   . Gout   . Anemia     Allergies: No Known Allergies   Review of Systems:  Review of Systems  Constitutional: Negative for fever, chills and malaise/fatigue.  HENT: Positive for congestion. Negative for ear pain and sore throat.   Eyes: Negative.   Respiratory: Negative for cough, shortness of breath and wheezing.   Cardiovascular: Negative for chest pain, palpitations and leg swelling.  Gastrointestinal: Negative for heartburn, abdominal pain, diarrhea, constipation, blood in stool and melena.  Genitourinary: Negative.   Skin: Negative.   Neurological: Negative for dizziness, sensory change, loss of consciousness and headaches.  Psychiatric/Behavioral: Negative for depression. The patient is not nervous/anxious and does not have insomnia.     Family history- Review and unchanged  Social history- Review and unchanged  Physical Exam: BP 126/70 mmHg  Pulse 80  Temp(Src) 98.2 F (36.8 C) (Temporal)  Resp 18  Ht 6' 1.5" (1.867 m)  Wt 274 lb (124.286 kg)  BMI 35.66 kg/m2 Wt Readings from Last 3 Encounters:  09/28/15 274 lb (124.286 kg)  08/25/15 270 lb (122.471 kg)  07/07/15 273 lb (123.832 kg)    General Appearance: Well nourished well developed, in no apparent distress. Eyes: PERRLA, EOMs, conjunctiva no swelling or erythema ENT/Mouth: Ear canals normal without obstruction, swelling, erythma, discharge.  TMs normal  bilaterally.  Oropharynx moist, clear, without exudate, or postoropharyngeal swelling. Neck: Supple, thyroid normal,no cervical adenopathy  Respiratory: Respiratory effort normal, Breath sounds clear A&P without rhonchi, wheeze, or rale.  No retractions, no accessory usage. Cardio: RRR with no MRGs. Brisk peripheral pulses without edema.  Abdomen: Soft, + BS,  Non tender, no guarding, rebound, hernias, masses. Musculoskeletal: Full ROM, 5/5 strength, Normal gait Skin: Warm, dry without rashes, lesions, ecchymosis.  Neuro: Awake and oriented X 3, Cranial nerves intact. Normal muscle tone, no cerebellar symptoms. Psych: Normal affect, Insight and Judgment appropriate.    Starlyn Skeans, PA-C 4:19 PM [facility]

## 2015-09-29 LAB — TESTOSTERONE: TESTOSTERONE: 251 ng/dL (ref 250–827)

## 2015-09-29 LAB — CBC WITH DIFFERENTIAL/PLATELET
Basophils Absolute: 0 10*3/uL (ref 0.0–0.1)
Basophils Relative: 0 % (ref 0–1)
Eosinophils Absolute: 0.5 10*3/uL (ref 0.0–0.7)
Eosinophils Relative: 5 % (ref 0–5)
HEMATOCRIT: 40.2 % (ref 39.0–52.0)
HEMOGLOBIN: 13.7 g/dL (ref 13.0–17.0)
LYMPHS PCT: 24 % (ref 12–46)
Lymphs Abs: 2.3 10*3/uL (ref 0.7–4.0)
MCH: 29.3 pg (ref 26.0–34.0)
MCHC: 34.1 g/dL (ref 30.0–36.0)
MCV: 86.1 fL (ref 78.0–100.0)
MONO ABS: 0.8 10*3/uL (ref 0.1–1.0)
MONOS PCT: 8 % (ref 3–12)
MPV: 9.3 fL (ref 8.6–12.4)
NEUTROS ABS: 6 10*3/uL (ref 1.7–7.7)
Neutrophils Relative %: 63 % (ref 43–77)
Platelets: 264 10*3/uL (ref 150–400)
RBC: 4.67 MIL/uL (ref 4.22–5.81)
RDW: 14.2 % (ref 11.5–15.5)
WBC: 9.6 10*3/uL (ref 4.0–10.5)

## 2015-09-29 LAB — BASIC METABOLIC PANEL WITH GFR
BUN: 12 mg/dL (ref 7–25)
CALCIUM: 9.3 mg/dL (ref 8.6–10.3)
CO2: 26 mmol/L (ref 20–31)
CREATININE: 1.09 mg/dL (ref 0.70–1.33)
Chloride: 104 mmol/L (ref 98–110)
GFR, EST NON AFRICAN AMERICAN: 78 mL/min (ref >=60)
GLUCOSE: 83 mg/dL (ref 65–99)
Potassium: 4.1 mmol/L (ref 3.5–5.3)
Sodium: 140 mmol/L (ref 135–146)

## 2015-09-29 LAB — HEPATIC FUNCTION PANEL
ALBUMIN: 4.2 g/dL (ref 3.6–5.1)
ALT: 16 U/L (ref 9–46)
AST: 19 U/L (ref 10–35)
Alkaline Phosphatase: 78 U/L (ref 40–115)
BILIRUBIN INDIRECT: 0.3 mg/dL (ref 0.2–1.2)
Bilirubin, Direct: 0.1 mg/dL (ref <=0.2)
TOTAL PROTEIN: 6.5 g/dL (ref 6.1–8.1)
Total Bilirubin: 0.4 mg/dL (ref 0.2–1.2)

## 2015-10-26 ENCOUNTER — Other Ambulatory Visit: Payer: Self-pay | Admitting: Urology

## 2015-10-26 DIAGNOSIS — R972 Elevated prostate specific antigen [PSA]: Secondary | ICD-10-CM

## 2015-11-06 ENCOUNTER — Other Ambulatory Visit: Payer: Self-pay | Admitting: Internal Medicine

## 2015-11-06 ENCOUNTER — Ambulatory Visit (HOSPITAL_COMMUNITY)
Admission: RE | Admit: 2015-11-06 | Discharge: 2015-11-06 | Disposition: A | Discharge status: Home / Self Care | Payer: 59 | Source: Ambulatory Visit | Attending: Urology | Admitting: Urology

## 2015-11-06 DIAGNOSIS — R972 Elevated prostate specific antigen [PSA]: Secondary | ICD-10-CM | POA: Diagnosis present

## 2015-11-06 MED ORDER — GADOBENATE DIMEGLUMINE 529 MG/ML IV SOLN
20.0000 mL | Freq: Once | INTRAVENOUS | Status: AC | PRN
  Administered 2015-11-06: 20 mL via INTRAVENOUS

## 2015-12-27 ENCOUNTER — Encounter: Payer: Self-pay | Admitting: Physician Assistant

## 2016-02-29 ENCOUNTER — Encounter: Payer: Self-pay | Admitting: Physician Assistant

## 2016-03-06 ENCOUNTER — Encounter: Payer: Self-pay | Admitting: Internal Medicine

## 2016-03-06 ENCOUNTER — Ambulatory Visit (INDEPENDENT_AMBULATORY_CARE_PROVIDER_SITE_OTHER): Payer: 59 | Admitting: Internal Medicine

## 2016-03-06 VITALS — BP 110/64 | HR 64 | Temp 98.2°F | Resp 16 | Ht 73.5 in | Wt 270.0 lb

## 2016-03-06 DIAGNOSIS — I10 Essential (primary) hypertension: Secondary | ICD-10-CM

## 2016-03-06 DIAGNOSIS — F419 Anxiety disorder, unspecified: Secondary | ICD-10-CM

## 2016-03-06 DIAGNOSIS — R7303 Prediabetes: Secondary | ICD-10-CM

## 2016-03-06 DIAGNOSIS — R0989 Other specified symptoms and signs involving the circulatory and respiratory systems: Secondary | ICD-10-CM

## 2016-03-06 DIAGNOSIS — Z0000 Encounter for general adult medical examination without abnormal findings: Secondary | ICD-10-CM | POA: Diagnosis not present

## 2016-03-06 DIAGNOSIS — E785 Hyperlipidemia, unspecified: Secondary | ICD-10-CM

## 2016-03-06 DIAGNOSIS — E559 Vitamin D deficiency, unspecified: Secondary | ICD-10-CM

## 2016-03-06 DIAGNOSIS — E349 Endocrine disorder, unspecified: Secondary | ICD-10-CM

## 2016-03-06 DIAGNOSIS — Z130 Encounter for screening for diseases of the blood and blood-forming organs and certain disorders involving the immune mechanism: Secondary | ICD-10-CM

## 2016-03-06 DIAGNOSIS — Z79899 Other long term (current) drug therapy: Secondary | ICD-10-CM

## 2016-03-06 DIAGNOSIS — Z136 Encounter for screening for cardiovascular disorders: Secondary | ICD-10-CM | POA: Diagnosis not present

## 2016-03-06 DIAGNOSIS — Z0001 Encounter for general adult medical examination with abnormal findings: Secondary | ICD-10-CM

## 2016-03-06 LAB — CBC WITH DIFFERENTIAL/PLATELET
BASOS PCT: 0 %
Basophils Absolute: 0 cells/uL (ref 0–200)
EOS PCT: 3 %
Eosinophils Absolute: 315 cells/uL (ref 15–500)
HCT: 41.7 % (ref 38.5–50.0)
Hemoglobin: 14 g/dL (ref 13.2–17.1)
LYMPHS PCT: 20 %
Lymphs Abs: 2100 cells/uL (ref 850–3900)
MCH: 28.8 pg (ref 27.0–33.0)
MCHC: 33.6 g/dL (ref 32.0–36.0)
MCV: 85.8 fL (ref 80.0–100.0)
MONOS PCT: 7 %
MPV: 9.5 fL (ref 7.5–12.5)
Monocytes Absolute: 735 cells/uL (ref 200–950)
NEUTROS ABS: 7350 {cells}/uL (ref 1500–7800)
Neutrophils Relative %: 70 %
PLATELETS: 251 10*3/uL (ref 140–400)
RBC: 4.86 MIL/uL (ref 4.20–5.80)
RDW: 14.5 % (ref 11.0–15.0)
WBC: 10.5 10*3/uL (ref 3.8–10.8)

## 2016-03-06 LAB — LIPID PANEL
CHOL/HDL RATIO: 5.9 ratio — AB (ref <=5.0)
Cholesterol: 183 mg/dL (ref 125–200)
HDL: 31 mg/dL — AB (ref >=40)
LDL Cholesterol: 106 mg/dL (ref <130)
TRIGLYCERIDES: 231 mg/dL — AB (ref <150)
VLDL: 46 mg/dL — ABNORMAL HIGH (ref <30)

## 2016-03-06 LAB — BASIC METABOLIC PANEL WITH GFR
BUN: 11 mg/dL (ref 7–25)
CHLORIDE: 102 mmol/L (ref 98–110)
CO2: 23 mmol/L (ref 20–31)
CREATININE: 1.03 mg/dL (ref 0.70–1.33)
Calcium: 9.1 mg/dL (ref 8.6–10.3)
GFR, Est Non African American: 83 mL/min (ref >=60)
Glucose, Bld: 82 mg/dL (ref 65–99)
POTASSIUM: 3.8 mmol/L (ref 3.5–5.3)
SODIUM: 138 mmol/L (ref 135–146)

## 2016-03-06 LAB — IRON AND TIBC
%SAT: 20 % (ref 15–60)
IRON: 71 ug/dL (ref 50–180)
TIBC: 352 ug/dL (ref 250–425)
UIBC: 281 ug/dL (ref 125–400)

## 2016-03-06 LAB — HEPATIC FUNCTION PANEL
ALBUMIN: 4.1 g/dL (ref 3.6–5.1)
ALK PHOS: 69 U/L (ref 40–115)
ALT: 17 U/L (ref 9–46)
AST: 20 U/L (ref 10–35)
BILIRUBIN TOTAL: 0.5 mg/dL (ref 0.2–1.2)
Bilirubin, Direct: 0.1 mg/dL (ref <=0.2)
Indirect Bilirubin: 0.4 mg/dL (ref 0.2–1.2)
TOTAL PROTEIN: 6.6 g/dL (ref 6.1–8.1)

## 2016-03-06 LAB — MAGNESIUM: MAGNESIUM: 1.9 mg/dL (ref 1.5–2.5)

## 2016-03-06 LAB — VITAMIN B12: VITAMIN B 12: 328 pg/mL (ref 200–1100)

## 2016-03-06 MED ORDER — TRAZODONE HCL 50 MG PO TABS: 50.0000 mg | ORAL_TABLET | Freq: Every day | ORAL | 0 refills | Status: DC

## 2016-03-06 NOTE — Progress Notes (Signed)
Complete Physical  Assessment and Plan:   1. Encounter for general adult medical examination with abnormal findings  - CBC with Differential/Platelet - BASIC METABOLIC PANEL WITH GFR - Magnesium - Hepatic function panel  2. Labile hypertension  - Urinalysis, Routine w reflex microscopic (not at [facility]) - Microalbumin / creatinine urine ratio - EKG 12-Lead - TSH  3. Hyperlipidemia  - Lipid panel  4. Prediabetes -cont diet and exercise - Hemoglobin A1c - Insulin, random  5. Vitamin D deficiency -cont Vit D - VITAMIN D 25 Hydroxy (Vit-D Deficiency, Fractures)  6. Gilbert's syndrome -check LFTs  7. Anxiety -cont celexa -stop xanax at bedtime -trazodone instead of bedtime.    8. Encounter for long-term (current) use of medications -cont lab monitoring  9. Screening for deficiency anemia  - Iron and TIBC - Vitamin B12  10. Testosterone deficiency - Testosterone   Discussed med's effects and SE's. Screening labs and tests as requested with regular follow-up as recommended.  HPI Patient presents for a complete physical.   His blood pressure has been controlled at home, today their BP is BP: 110/64 He does not workout. He denies chest pain, shortness of breath, dizziness.   He is on cholesterol medication and denies myalgias. His cholesterol is at goal. The cholesterol last visit was:   Lab Results  Component Value Date   CHOL 177 03/01/2015   HDL 25 (L) 03/01/2015   LDLCALC 96 03/01/2015   TRIG 281 (H) 03/01/2015   CHOLHDL 7.1 (H) 03/01/2015    He has been working on diet and exercise for prediabetes, he is on bASA, he is not on ACE/ARB and denies foot ulcerations, hyperglycemia, hypoglycemia , increased appetite, nausea, paresthesia of the feet, polydipsia, polyuria, visual disturbances, vomiting and weight loss. Last A1C in the office was:  Lab Results  Component Value Date   HGBA1C 5.7 (H) 03/01/2015   Patient is on Vitamin D supplement.   Lab  Results  Component Value Date   VD25OH 26 (L) 03/01/2015      Last PSA was: No results found for: PSA.  Denies BPH symptoms daytime frequency, double voiding, dysuria, hematuria, hesitancy, incontinence, intermittency, nocturia, sensation of incomplete bladder emptying, suprapubic pain, urgency or weak urinary stream.    Current Medications:  Current Outpatient Prescriptions on File Prior to Visit  Medication Sig Dispense Refill  . ALPRAZolam (XANAX) 0.5 MG tablet Take 1 tablet (0.5 mg total) by mouth 2 (two) times daily as needed for anxiety or sleep. 90 tablet 1  . Cholecalciferol (VITAMIN D PO) Take by mouth daily.    . citalopram (CELEXA) 40 MG tablet TAKE 1 TABLET (40 MG TOTAL) BY MOUTH DAILY.((MAX PER INSURANCE IS 30 )) 90 tablet 3  . RaNITidine HCl (ZANTAC 150 MAXIMUM STRENGTH PO) Take 150 mg by mouth 2 (two) times daily.     No current facility-administered medications on file prior to visit.     Health Maintenance:  Immunization History  Administered Date(s) Administered  . Pneumococcal-Unspecified 11/06/1995  . Td 11/01/2004  . Tdap 03/01/2015    Tetanus: 2016 Colonoscopy: 2016 Eye Exam: Once yearly Dentist:  Twice yearly  Patient Care Team: Unk Pinto, MD as PCP - General (Internal Medicine) [name], MD as Attending Physician (Urology) Ladene Artist, MD as Consulting Physician (Gastroenterology) Kary Kos, MD as Consulting Physician (Neurosurgery)  Allergies: No Known Allergies  Medical History:  Past Medical History:  Diagnosis Date  . Allergy   . Anemia   . Anxiety   .  DDD (degenerative disc disease)   . GERD (gastroesophageal reflux disease)   . Gilbert's syndrome    pt denies dx  . Gout   . Hyperlipidemia   . Hypertension     Surgical History:  Past Surgical History:  Procedure Laterality Date  . CHOLECYSTECTOMY    . CHOLESTEATOMA EXCISION    . PROSTATE BIOPSY    . prostrate    . UPPER GASTROINTESTINAL ENDOSCOPY  2011  .  VASECTOMY    . WISDOM TOOTH EXTRACTION Bilateral     Family History:  Family History  Problem Relation Age of Onset  . Lung cancer Mother   . Diabetes Father   . Hypertension Father   . Colon cancer Neg Hx     Social History:   Social History  Substance Use Topics  . Smoking status: Never Smoker  . Smokeless tobacco: Never Used  . Alcohol use 0.0 oz/week     Comment: social    Review of Systems:  Review of Systems  Constitutional: Negative for chills, fever and malaise/fatigue.  HENT: Negative for congestion, ear pain and sore throat.   Eyes: Negative.   Respiratory: Negative for cough, shortness of breath and wheezing.   Cardiovascular: Negative for chest pain, palpitations and leg swelling.  Gastrointestinal: Negative for abdominal pain, blood in stool, constipation, diarrhea, heartburn and melena.  Genitourinary: Negative.   Skin: Negative.   Neurological: Negative for dizziness, sensory change, loss of consciousness and headaches.  Psychiatric/Behavioral: Negative for depression. The patient is not nervous/anxious and does not have insomnia.     Physical Exam: Estimated body mass index is 35.14 kg/m as calculated from the following:   Height as of this encounter: 6' 1.5" (1.867 m).   Weight as of this encounter: 270 lb (122.5 kg). BP 110/64   Pulse 64   Temp 98.2 F (36.8 C) (Temporal)   Resp 16   Ht 6' 1.5" (1.867 m)   Wt 270 lb (122.5 kg)   BMI 35.14 kg/m   General Appearance: Well nourished, in no apparent distress.  Eyes: PERRLA, EOMs, conjunctiva no swelling or erythema ENT/Mouth: Ear canals clear bilaterally with no erythema, swelling, discharge.  TMs normal bilaterally with no erythema, bulging, or retractions.  Oropharynx clear and moist with no exudate, swelling, or erythema.  Dentition normal.   Neck: Supple, thyroid normal. No bruits, JVD, cervical adenopathy Respiratory: Respiratory effort normal, BS equal bilaterally without rales, rhonchi,  wheezing or stridor.  Cardio: RRR without murmurs, rubs or gallops. Brisk peripheral pulses without edema.  Chest: symmetric, with normal excursions Abdomen: Soft, nontender, no guarding, rebound, hernias, masses, or organomegaly. Genitourinary: Deferred to urology Musculoskeletal: Full ROM all peripheral extremities,5/5 strength, and normal gait.  Skin: Warm, dry without rashes, lesions, ecchymosis. Neuro: A&Ox3, Cranial nerves intact, reflexes equal bilaterally. Normal muscle tone, no cerebellar symptoms. Sensation intact.  Psych: Normal affect, Insight and Judgment appropriate.   EKG: WNL no changes.  Over 40 minutes of exam, counseling, chart review and critical decision making was performed  Starlyn Skeans 3:39 PM [facility]

## 2016-03-07 LAB — URINALYSIS, ROUTINE W REFLEX MICROSCOPIC
BILIRUBIN URINE: NEGATIVE
GLUCOSE, UA: NEGATIVE
HGB URINE DIPSTICK: NEGATIVE
KETONES UR: NEGATIVE
Leukocytes, UA: NEGATIVE
Nitrite: NEGATIVE
PH: 6 (ref 5.0–8.0)
PROTEIN: NEGATIVE
Specific Gravity, Urine: 1.012 (ref 1.001–1.035)

## 2016-03-07 LAB — TSH: TSH: 2.28 mIU/L (ref 0.40–4.50)

## 2016-03-07 LAB — VITAMIN D 25 HYDROXY (VIT D DEFICIENCY, FRACTURES): Vit D, 25-Hydroxy: 29 ng/mL — ABNORMAL LOW (ref 30–100)

## 2016-03-07 LAB — HEMOGLOBIN A1C
HEMOGLOBIN A1C: 5.5 % (ref <5.7)
Mean Plasma Glucose: 111 mg/dL

## 2016-03-07 LAB — INSULIN, RANDOM: Insulin: 10 u[IU]/mL (ref 2.0–19.6)

## 2016-03-07 LAB — MICROALBUMIN / CREATININE URINE RATIO
Creatinine, Urine: 133 mg/dL (ref 20–370)
MICROALB/CREAT RATIO: 2 ug/mg{creat} (ref <30)
Microalb, Ur: 0.3 mg/dL

## 2016-03-07 LAB — TESTOSTERONE: TESTOSTERONE: 221 ng/dL — AB (ref 250–827)

## 2016-03-11 NOTE — Addendum Note (Signed)
Addended by: Lachell Rochette A on: 03/11/2016 11:58 AM   Modules accepted: Orders

## 2016-04-01 ENCOUNTER — Other Ambulatory Visit: Payer: Self-pay | Admitting: Internal Medicine

## 2016-05-02 ENCOUNTER — Other Ambulatory Visit: Payer: Self-pay | Admitting: *Deleted

## 2016-05-02 MED ORDER — TRAZODONE HCL 50 MG PO TABS: 50.0000 mg | ORAL_TABLET | Freq: Every day | ORAL | 0 refills | Status: DC

## 2016-05-03 ENCOUNTER — Other Ambulatory Visit: Payer: Self-pay | Admitting: Internal Medicine

## 2016-07-16 DIAGNOSIS — E782 Mixed hyperlipidemia: Secondary | ICD-10-CM | POA: Diagnosis not present

## 2016-07-16 DIAGNOSIS — E291 Testicular hypofunction: Secondary | ICD-10-CM | POA: Diagnosis not present

## 2016-08-14 DIAGNOSIS — R7303 Prediabetes: Secondary | ICD-10-CM | POA: Diagnosis not present

## 2016-08-14 DIAGNOSIS — E782 Mixed hyperlipidemia: Secondary | ICD-10-CM | POA: Diagnosis not present

## 2016-11-03 ENCOUNTER — Other Ambulatory Visit: Payer: Self-pay | Admitting: Internal Medicine

## 2016-11-05 DIAGNOSIS — H16001 Unspecified corneal ulcer, right eye: Secondary | ICD-10-CM | POA: Diagnosis not present

## 2016-11-07 DIAGNOSIS — H16011 Central corneal ulcer, right eye: Secondary | ICD-10-CM | POA: Diagnosis not present

## 2016-11-12 DIAGNOSIS — H16001 Unspecified corneal ulcer, right eye: Secondary | ICD-10-CM | POA: Diagnosis not present

## 2016-12-13 ENCOUNTER — Other Ambulatory Visit: Payer: Self-pay

## 2016-12-13 MED ORDER — CITALOPRAM HYDROBROMIDE 40 MG PO TABS: ORAL_TABLET | ORAL | 3 refills | Status: DC

## 2017-03-10 NOTE — Progress Notes (Signed)
Complete Physical  Assessment and Plan:  Encounter for general adult medical examination with abnormal findings  Labile hypertension - continue medications, DASH diet, exercise and monitor at home. Call if greater than 130/80.  -     CBC with Differential/Platelet -     BASIC METABOLIC PANEL WITH GFR -     Hepatic function panel -     TSH -     Urinalysis, Routine w reflex microscopic -     Microalbumin / creatinine urine ratio -     Uric acid  Hyperlipidemia, unspecified hyperlipidemia type -continue medications, check lipids, decrease fatty foods, increase activity.  -     Lipid panel  ESOPHAGEAL STRICTURE Continue PPI/H2 blocker, diet discussed  Prediabetes Discussed general issues about diabetes pathophysiology and management., Educational material distributed., Suggested low cholesterol diet., Encouraged aerobic exercise., Discussed foot care., Reminded to get yearly retinal exam. -     Hemoglobin A1c  Vitamin D deficiency -     VITAMIN D 25 Hydroxy (Vit-D Deficiency, Fractures)  Gilbert's syndrome -     Hepatic function panel  Chronic gout without tophus, unspecified cause, unspecified site Gout- recheck Uric acid as needed, Diet discussed, continue medications. -     Uric acid  Insomnia, unspecified type Insomnia- good sleep hygiene discussed, increase day time activity, try melatonin or benadryl if this does not help we will call in sleep medication.   Anxiety-  continue medications, stress management techniques discussed, increase water, good sleep hygiene discussed, increase exercise, and increase veggies.   Gastroesophageal reflux disease, esophagitis presence not specified Continue PPI/H2 blocker, diet discussed  Medication management -     Magnesium  Dermatitis Appears to be eczmea, call if spread/any worse -     triamcinolone cream (KENALOG) 0.5 %; Apply 1 application topically 2 (two) times daily.  Screening, anemia, deficiency, iron -      Iron,Total/Total Iron Binding Cap -     Vitamin B12  Fatigue, unspecified type -     Testosterone -     EKG 12-Lead  Screening PSA (prostate specific antigen) -     PSA  Discussed med's effects and SE's. Screening labs and tests as requested with regular follow-up as recommended.  HPI Patient presents for a complete physical.   His blood pressure has been controlled at home, today their BP is BP: 116/80 He does not workout. He denies chest pain, shortness of breath, dizziness. Back of right knee with itching x 1-2 months with dry skin, has noticed sparse hair on bilateral legs. No rash anywhere else, no changes in detergents.  He is on cholesterol medication and denies myalgias. His cholesterol is at goal. The cholesterol last visit was:   Lab Results  Component Value Date   CHOL 183 03/06/2016   HDL 31 (L) 03/06/2016   LDLCALC 106 03/06/2016   TRIG 231 (H) 03/06/2016   CHOLHDL 5.9 (H) 03/06/2016   He has been working on diet and exercise for prediabetes, he is on bASA, he is not on ACE/ARB and denies foot ulcerations, hyperglycemia, hypoglycemia , increased appetite, nausea, paresthesia of the feet, polydipsia, polyuria, visual disturbances, vomiting and weight loss. Last A1C in the office was:  Lab Results  Component Value Date   HGBA1C 5.5 03/06/2016   Patient is on Vitamin D supplement.   Lab Results  Component Value Date   VD25OH 29 (L) 03/06/2016   Patient is NOT on allopurinol for gout and does not report a recent flare.  Lab  Results  Component Value Date   LABURIC 8.1 (H) 03/01/2015     BMI is Body mass index is 34.1 kg/m., he is working on diet and exercise. Wt Readings from Last 3 Encounters:  03/11/17 262 lb (118.8 kg)  03/06/16 270 lb (122.5 kg)  09/28/15 274 lb (124.3 kg)   Has had some GERD, does take ibuprofen for lower back. Has gotten off prilosec and on zantac.    Current Medications:  Current Outpatient Prescriptions on File Prior to Visit   Medication Sig Dispense Refill  . ALPRAZolam (XANAX) 0.5 MG tablet Take 1 tablet (0.5 mg total) by mouth 2 (two) times daily as needed for anxiety or sleep. 90 tablet 1  . Cholecalciferol (VITAMIN D PO) Take by mouth daily.    . citalopram (CELEXA) 40 MG tablet TAKE 1 TABLET (40 MG TOTAL) BY MOUTH DAILY.((MAX PER INSURANCE IS 30 )) 90 tablet 3  . RaNITidine HCl (ZANTAC 150 MAXIMUM STRENGTH PO) Take 150 mg by mouth 2 (two) times daily.    . traZODone (DESYREL) 50 MG tablet TAKE 1 TABLET BY MOUTH AT BEDTIME 90 tablet 1   No current facility-administered medications on file prior to visit.     Health Maintenance:  Immunization History  Administered Date(s) Administered  . Pneumococcal-Unspecified 11/06/1995  . Td 11/01/2004  . Tdap 03/01/2015   Tetanus: 2016 Colonoscopy: 2016 Eye Exam: Once yearly Dentist:  Twice yearly  Patient Care Team: Unk Pinto, MD as PCP - General (Internal Medicine) [name], MD as Attending Physician (Urology) Ladene Artist, MD as Consulting Physician (Gastroenterology) Kary Kos, MD as Consulting Physician (Neurosurgery)  Medical History:  Past Medical History:  Diagnosis Date  . Allergy   . Anemia   . Anxiety   . DDD (degenerative disc disease)   . GERD (gastroesophageal reflux disease)   . Gilbert's syndrome    pt denies dx  . Gout   . Hyperlipidemia   . Hypertension     Allergies No Known Allergies  SURGICAL HISTORY He  has a past surgical history that includes Wisdom tooth extraction (Bilateral); Cholesteatoma excision; Vasectomy; Cholecystectomy; prostrate; Prostate biopsy; and Upper gastrointestinal endoscopy (2011). FAMILY HISTORY His family history includes Diabetes in his father; Hypertension in his father; Lung cancer in his mother. SOCIAL HISTORY He  reports that he has never smoked. He has never used smokeless tobacco. He reports that he drinks alcohol. He reports that he does not use drugs.   Review of Systems:   Review of Systems  Constitutional: Positive for malaise/fatigue. Negative for chills and fever.  HENT: Negative for congestion, ear pain and sore throat.   Eyes: Negative.   Respiratory: Negative for cough, shortness of breath and wheezing.   Cardiovascular: Negative for chest pain, palpitations and leg swelling.  Gastrointestinal: Negative for abdominal pain, blood in stool, constipation, diarrhea, heartburn and melena.  Genitourinary: Negative.   Skin: Positive for rash. Negative for itching.  Neurological: Negative for dizziness, sensory change, loss of consciousness and headaches.  Psychiatric/Behavioral: Negative for depression. The patient is not nervous/anxious and does not have insomnia.     Physical Exam: Estimated body mass index is 34.1 kg/m as calculated from the following:   Height as of this encounter: 6' 1.5" (1.867 m).   Weight as of this encounter: 262 lb (118.8 kg). BP 116/80   Pulse 95   Temp 97.7 F (36.5 C)   Resp 16   Ht 6' 1.5" (1.867 m)   Wt 262 lb (118.8  kg)   SpO2 96%   BMI 34.10 kg/m   General Appearance: Well nourished, in no apparent distress.  Eyes: PERRLA, EOMs, conjunctiva no swelling or erythema ENT/Mouth: Ear canals clear bilaterally with no erythema, swelling, discharge.  TMs normal bilaterally with no erythema, bulging, or retractions.  Oropharynx clear and moist with no exudate, swelling, or erythema.  Dentition normal.   Neck: Supple, thyroid normal. No bruits, JVD, cervical adenopathy Respiratory: Respiratory effort normal, BS equal bilaterally without rales, rhonchi, wheezing or stridor.  Cardio: RRR without murmurs, rubs or gallops. Brisk peripheral pulses without edema.  Chest: symmetric, with normal excursions Abdomen: Soft, nontender, no guarding, rebound, hernias, masses, or organomegaly. Genitourinary: Deferred to urology Musculoskeletal: Full ROM all peripheral extremities,5/5 strength, and normal gait.  Skin: right posterior  knee with erythematous dry rash, Warm, dry without rashes, lesions, ecchymosis. Neuro: A&Ox3, Cranial nerves intact, reflexes equal bilaterally. Normal muscle tone, no cerebellar symptoms. Sensation intact.  Psych: Normal affect, Insight and Judgment appropriate.   EKG: WNL no changes.  Over 40 minutes of exam, counseling, chart review and critical decision making was performed  Vicie Mutters 2:02 PM [facility]

## 2017-03-11 ENCOUNTER — Encounter: Payer: Self-pay | Admitting: Physician Assistant

## 2017-03-11 ENCOUNTER — Ambulatory Visit (INDEPENDENT_AMBULATORY_CARE_PROVIDER_SITE_OTHER): Payer: 59 | Admitting: Physician Assistant

## 2017-03-11 VITALS — BP 116/80 | HR 95 | Temp 97.7°F | Resp 16 | Ht 73.5 in | Wt 262.0 lb

## 2017-03-11 DIAGNOSIS — Z0001 Encounter for general adult medical examination with abnormal findings: Secondary | ICD-10-CM | POA: Diagnosis not present

## 2017-03-11 DIAGNOSIS — R6889 Other general symptoms and signs: Secondary | ICD-10-CM

## 2017-03-11 DIAGNOSIS — E785 Hyperlipidemia, unspecified: Secondary | ICD-10-CM

## 2017-03-11 DIAGNOSIS — Z130 Encounter for screening for diseases of the blood and blood-forming organs and certain disorders involving the immune mechanism: Secondary | ICD-10-CM

## 2017-03-11 DIAGNOSIS — Z125 Encounter for screening for malignant neoplasm of prostate: Secondary | ICD-10-CM

## 2017-03-11 DIAGNOSIS — G4700 Insomnia, unspecified: Secondary | ICD-10-CM

## 2017-03-11 DIAGNOSIS — K219 Gastro-esophageal reflux disease without esophagitis: Secondary | ICD-10-CM

## 2017-03-11 DIAGNOSIS — E559 Vitamin D deficiency, unspecified: Secondary | ICD-10-CM

## 2017-03-11 DIAGNOSIS — I10 Essential (primary) hypertension: Secondary | ICD-10-CM

## 2017-03-11 DIAGNOSIS — Z79899 Other long term (current) drug therapy: Secondary | ICD-10-CM

## 2017-03-11 DIAGNOSIS — R5383 Other fatigue: Secondary | ICD-10-CM

## 2017-03-11 DIAGNOSIS — R0989 Other specified symptoms and signs involving the circulatory and respiratory systems: Secondary | ICD-10-CM

## 2017-03-11 DIAGNOSIS — R7303 Prediabetes: Secondary | ICD-10-CM

## 2017-03-11 DIAGNOSIS — Z136 Encounter for screening for cardiovascular disorders: Secondary | ICD-10-CM | POA: Diagnosis not present

## 2017-03-11 DIAGNOSIS — M1A9XX0 Chronic gout, unspecified, without tophus (tophi): Secondary | ICD-10-CM

## 2017-03-11 DIAGNOSIS — L309 Dermatitis, unspecified: Secondary | ICD-10-CM

## 2017-03-11 DIAGNOSIS — F419 Anxiety disorder, unspecified: Secondary | ICD-10-CM

## 2017-03-11 DIAGNOSIS — K222 Esophageal obstruction: Secondary | ICD-10-CM | POA: Diagnosis not present

## 2017-03-11 MED ORDER — TRIAMCINOLONE ACETONIDE 0.5 % EX CREA: 1.0000 "application " | TOPICAL_CREAM | Freq: Two times a day (BID) | CUTANEOUS | 2 refills | Status: DC

## 2017-03-11 NOTE — Patient Instructions (Addendum)
Add zinc 40mg  a day  Drink 80-100 oz a day of water, measure it out Eat 3 meals a day, have to do breakfast, eat protein- hard boiled eggs, protein bar like [place] protein bar, greek yogurt like oikos triple zero, chobani 100, or light n fit greek  9 Ways to Naturally Increase Testosterone Levels  1.   Lose Weight If you're overweight, shedding the excess pounds may increase your testosterone levels, according to research presented at the Endocrine Society's 2012 meeting. Overweight men are more likely to have low testosterone levels to begin with, so this is an important trick to increase your body's testosterone production when you need it most.  2.   High-Intensity Exercise like Peak Fitness  Short intense exercise has a proven positive effect on increasing testosterone levels and preventing its decline. That's unlike aerobics or prolonged moderate exercise, which have shown to have negative or no effect on testosterone levels. Having a whey protein meal after exercise can further enhance the satiety/testosterone-boosting impact (hunger hormones cause the opposite effect on your testosterone and libido). Here's a summary of what a typical high-intensity Peak Fitness routine might look like: " Warm up for three minutes  " Exercise as hard and fast as you can for 30 seconds. You should feel like you couldn't possibly go on another few seconds  " Recover at a slow to moderate pace for 90 seconds  " Repeat the high intensity exercise and recovery 7 more times .  3.   Consume Plenty of Zinc The mineral zinc is important for testosterone production, and supplementing your diet for as little as six weeks has been shown to cause a marked improvement in testosterone among men with low levels.1 Likewise, research has shown that restricting dietary sources of zinc leads to a significant decrease in testosterone, while zinc supplementation increases it2 -- and even protects men from  exercised-induced reductions in testosterone levels.3 It's estimated that up to 44 percent of adults over the age of 60 may have lower than recommended zinc intakes; even when dietary supplements were added in, an estimated 20-25 percent of older adults still had inadequate zinc intakes, according to a [employer] and Nutrition Examination Survey.4 Your diet is the best source of zinc; along with protein-rich foods like meats and fish, other good dietary sources of zinc include raw milk, raw cheese, beans, and yogurt or kefir made from raw milk. It can be difficult to obtain enough dietary zinc if you're a vegetarian, and also for meat-eaters as well, largely because of conventional farming methods that rely heavily on chemical fertilizers and pesticides. These chemicals deplete the soil of nutrients ... nutrients like zinc that must be absorbed by plants in order to be passed on to you. In many cases, you may further deplete the nutrients in your food by the way you prepare it. For most food, cooking it will drastically reduce its levels of nutrients like zinc . particularly over-cooking, which many people do. If you decide to use a zinc supplement, stick to a dosage of less than 40 mg a day, as this is the recommended adult upper limit. Taking too much zinc can interfere with your body's ability to absorb other minerals, especially copper, and may cause nausea as a side effect.  4.   Strength Training In addition to Peak Fitness, strength training is also known to boost testosterone levels, provided you are doing so intensely enough. When strength training to boost testosterone, you'll want to increase  the weight and lower your number of reps, and then focus on exercises that work a large number of muscles, such as dead lifts or squats.  You can "turbo-charge" your weight training by going slower. By slowing down your movement, you're actually turning it into a high-intensity exercise. Super Slow  movement allows your muscle, at the microscopic level, to access the maximum number of cross-bridges between the protein filaments that produce movement in the muscle.   5.   Optimize Your Vitamin D Levels Vitamin D, a steroid hormone, is essential for the healthy development of the nucleus of the sperm cell, and helps maintain semen quality and sperm count. Vitamin D also increases levels of testosterone, which may boost libido. In one study, overweight men who were given vitamin D supplements had a significant increase in testosterone levels after one year.5   6.   Reduce Stress When you're under a lot of stress, your body releases high levels of the stress hormone cortisol. This hormone actually blocks the effects of testosterone,6 presumably because, from a biological standpoint, testosterone-associated behaviors (mating, competing, aggression) may have lowered your chances of survival in an emergency (hence, the "fight or flight" response is dominant, courtesy of cortisol).  7.   Limit or Eliminate Sugar from Your Diet Testosterone levels decrease after you eat sugar, which is likely because the sugar leads to a high insulin level, another factor leading to low testosterone.7 Based on USDA estimates, the average American consumes 12 teaspoons of sugar a day, which equates to about TWO TONS of sugar during a lifetime.  8.   Eat Healthy Fats By healthy, this means not only mon- and polyunsaturated fats, like that found in avocadoes and nuts, but also saturated, as these are essential for building testosterone. Research shows that a diet with less than 40 percent of energy as fat (and that mainly from animal sources, i.e. saturated) lead to a decrease in testosterone levels.8 My personal diet is about 60-70 percent healthy fat, and other experts agree that the ideal diet includes somewhere between 50-70 percent fat.  It's important to understand that your body requires saturated fats from animal and  vegetable sources (such as meat, dairy, certain oils, and tropical plants like coconut) for optimal functioning, and if you neglect this important food group in favor of sugar, grains and other starchy carbs, your health and weight are almost guaranteed to suffer. Examples of healthy fats you can eat more of to give your testosterone levels a boost include: Olives and Olive oil  Coconuts and coconut oil Butter made from raw grass-fed organic milk Raw nuts, such as, almonds or pecans Organic pastured egg yolks Avocados Grass-fed meats Palm oil Unheated organic nut oils   9.   Boost Your Intake of Branch Chain Amino Acids (BCAA) from Foods Like [place] suggests that BCAAs result in higher testosterone levels, particularly when taken along with resistance training.9 While BCAAs are available in supplement form, you'll find the highest concentrations of BCAAs like leucine in dairy products - especially quality cheeses and whey protein. Even when getting leucine from your natural food supply, it's often wasted or used as a building block instead of an anabolic agent. So to create the correct anabolic environment, you need to boost leucine consumption way beyond mere maintenance levels. That said, keep in mind that using leucine as a free form amino acid can be highly counterproductive as when free form amino acids are artificially administrated, they rapidly enter your circulation  while disrupting insulin function, and impairing your body's glycemic control. Food-based leucine is really the ideal form that can benefit your muscles without side effects.   Eczema Eczema, also called atopic dermatitis, is a skin disorder that causes inflammation of the skin. It causes a red rash and dry, scaly skin. The skin becomes very itchy. Eczema is generally worse during the cooler winter months and often improves with the warmth of summer. Eczema usually starts showing signs in infancy. Some children outgrow  eczema, but it may last through adulthood. What are the causes? The exact cause of eczema is not known, but it appears to run in families. People with eczema often have a family history of eczema, allergies, asthma, or hay fever. Eczema is not contagious. Flare-ups of the condition may be caused by:  Contact with something you are sensitive or allergic to.  Stress.  What are the signs or symptoms?  Dry, scaly skin.  Red, itchy rash.  Itchiness. This may occur before the skin rash and may be very intense. How is this diagnosed? The diagnosis of eczema is usually made based on symptoms and medical history. How is this treated? Eczema cannot be cured, but symptoms usually can be controlled with treatment and other strategies. A treatment plan might include:  Controlling the itching and scratching. ? Use over-the-counter antihistamines as directed for itching. This is especially useful at night when the itching tends to be worse. ? Use over-the-counter steroid creams as directed for itching. ? Avoid scratching. Scratching makes the rash and itching worse. It may also result in a skin infection (impetigo) due to a break in the skin caused by scratching.  Keeping the skin well moisturized with creams every day. This will seal in moisture and help prevent dryness. Lotions that contain alcohol and water should be avoided because they can dry the skin.  Limiting exposure to things that you are sensitive or allergic to (allergens).  Recognizing situations that cause stress.  Developing a plan to manage stress.  Follow these instructions at home:  Only take over-the-counter or prescription medicines as directed by your health care provider.  Do not use anything on the skin without checking with your health care provider.  Keep baths or showers short (5 minutes) in warm (not hot) water. Use mild cleansers for bathing. These should be unscented. You may add nonperfumed bath oil to the bath  water. It is best to avoid soap and bubble bath.  Immediately after a bath or shower, when the skin is still damp, apply a moisturizing ointment to the entire body. This ointment should be a petroleum ointment. This will seal in moisture and help prevent dryness. The thicker the ointment, the better. These should be unscented.  Keep fingernails cut short. Children with eczema may need to wear soft gloves or mittens at night after applying an ointment.  Dress in clothes made of cotton or cotton blends. Dress lightly, because heat increases itching.  A child with eczema should stay away from anyone with fever blisters or cold sores. The virus that causes fever blisters (herpes simplex) can cause a serious skin infection in children with eczema. Contact a health care provider if:  Your itching interferes with sleep.  Your rash gets worse or is not better within 1 week after starting treatment.  You see pus or soft yellow scabs in the rash area.  You have a fever.  You have a rash flare-up after contact with someone who has fever blisters. This  information is not intended to replace advice given to you by your health care provider. Make sure you discuss any questions you have with your health care provider. Document Released: 06/28/2000 Document Revised: 12/07/2015 Document Reviewed: 02/01/2013 Elsevier Interactive Patient Education  2017 [employer]   Back Pain, Adult Back pain is very common in adults.The cause of back pain is rarely dangerous and the pain often gets better over time.The cause of your back pain may not be known. Some common causes of back pain include:  Strain of the muscles or ligaments supporting the spine.  Wear and tear (degeneration) of the spinal disks.  Arthritis.  Direct injury to the back.  For many people, back pain may return. Since back pain is rarely dangerous, most people can learn to manage this condition on their own. Follow these instructions  at home: Watch your back pain for any changes. The following actions may help to lessen any discomfort you are feeling:  Remain active. It is stressful on your back to sit or stand in one place for long periods of time. Do not sit, drive, or stand in one place for more than 30 minutes at a time. Take short walks on even surfaces as soon as you are able.Try to increase the length of time you walk each day.  Exercise regularly as directed by your health care provider. Exercise helps your back heal faster. It also helps avoid future injury by keeping your muscles strong and flexible.  Do not stay in bed.Resting more than 1-2 days can delay your recovery.  Pay attention to your body when you bend and lift. The most comfortable positions are those that put less stress on your recovering back. Always use proper lifting techniques, including: ? Bending your knees. ? Keeping the load close to your body. ? Avoiding twisting.  Find a comfortable position to sleep. Use a firm mattress and lie on your side with your knees slightly bent. If you lie on your back, put a pillow under your knees.  Avoid feeling anxious or stressed.Stress increases muscle tension and can worsen back pain.It is important to recognize when you are anxious or stressed and learn ways to manage it, such as with exercise.  Take medicines only as directed by your health care provider. Over-the-counter medicines to reduce pain and inflammation are often the most helpful.Your health care provider may prescribe muscle relaxant drugs.These medicines help dull your pain so you can more quickly return to your normal activities and healthy exercise.  Apply ice to the injured area: ? Put ice in a plastic bag. ? Place a towel between your skin and the bag. ? Leave the ice on for 20 minutes, 2-3 times a day for the first 2-3 days. After that, ice and heat may be alternated to reduce pain and spasms.  Maintain a healthy weight. Excess  weight puts extra stress on your back and makes it difficult to maintain good posture.  Contact a health care provider if:  You have pain that is not relieved with rest or medicine.  You have increasing pain going down into the legs or buttocks.  You have pain that does not improve in one week.  You have night pain.  You lose weight.  You have a fever or chills. Get help right away if:  You develop new bowel or bladder control problems.  You have unusual weakness or numbness in your arms or legs.  You develop nausea or vomiting.  You develop abdominal  pain.  You feel faint. This information is not intended to replace advice given to you by your health care provider. Make sure you discuss any questions you have with your health care provider. Document Released: 07/01/2005 Document Revised: 11/09/2015 Document Reviewed: 11/02/2013 Elsevier Interactive Patient Education  2017 [employer].

## 2017-03-12 NOTE — Progress Notes (Signed)
Pt aware of lab results & voiced understanding of those results.

## 2017-03-13 LAB — CBC WITH DIFFERENTIAL/PLATELET
Basophils Absolute: 56 cells/uL (ref 0–200)
Basophils Relative: 0.6 %
EOS PCT: 6.6 %
Eosinophils Absolute: 614 cells/uL — ABNORMAL HIGH (ref 15–500)
HEMATOCRIT: 40.8 % (ref 38.5–50.0)
HEMOGLOBIN: 13.8 g/dL (ref 13.2–17.1)
LYMPHS ABS: 2353 {cells}/uL (ref 850–3900)
MCH: 28.9 pg (ref 27.0–33.0)
MCHC: 33.8 g/dL (ref 32.0–36.0)
MCV: 85.4 fL (ref 80.0–100.0)
MPV: 9.8 fL (ref 7.5–12.5)
Monocytes Relative: 8.4 %
NEUTROS ABS: 5496 {cells}/uL (ref 1500–7800)
Neutrophils Relative %: 59.1 %
Platelets: 261 10*3/uL (ref 140–400)
RBC: 4.78 10*6/uL (ref 4.20–5.80)
RDW: 13 % (ref 11.0–15.0)
Total Lymphocyte: 25.3 %
WBC: 9.3 10*3/uL (ref 3.8–10.8)
WBCMIX: 781 {cells}/uL (ref 200–950)

## 2017-03-13 LAB — HEPATIC FUNCTION PANEL
AG Ratio: 1.7 (calc) (ref 1.0–2.5)
ALBUMIN MSPROF: 4 g/dL (ref 3.6–5.1)
ALKALINE PHOSPHATASE (APISO): 70 U/L (ref 40–115)
ALT: 12 U/L (ref 9–46)
AST: 16 U/L (ref 10–35)
BILIRUBIN TOTAL: 0.4 mg/dL (ref 0.2–1.2)
Bilirubin, Direct: 0.1 mg/dL (ref 0.0–0.2)
Globulin: 2.4 g/dL (calc) (ref 1.9–3.7)
Indirect Bilirubin: 0.3 mg/dL (calc) (ref 0.2–1.2)
TOTAL PROTEIN: 6.4 g/dL (ref 6.1–8.1)

## 2017-03-13 LAB — VITAMIN B12: VITAMIN B 12: 387 pg/mL (ref 200–1100)

## 2017-03-13 LAB — BASIC METABOLIC PANEL WITH GFR
BUN: 15 mg/dL (ref 7–25)
CALCIUM: 9 mg/dL (ref 8.6–10.3)
CHLORIDE: 104 mmol/L (ref 98–110)
CO2: 26 mmol/L (ref 20–32)
Creat: 1.32 mg/dL (ref 0.70–1.33)
GFR, EST NON AFRICAN AMERICAN: 61 mL/min/{1.73_m2} (ref >=60)
GFR, Est African American: 71 mL/min/{1.73_m2} (ref >=60)
GLUCOSE: 77 mg/dL (ref 65–99)
POTASSIUM: 4.1 mmol/L (ref 3.5–5.3)
SODIUM: 139 mmol/L (ref 135–146)

## 2017-03-13 LAB — URIC ACID: URIC ACID, SERUM: 7.4 mg/dL (ref 4.0–8.0)

## 2017-03-13 LAB — TSH: TSH: 2.81 m[IU]/L (ref 0.40–4.50)

## 2017-03-13 LAB — URINALYSIS, ROUTINE W REFLEX MICROSCOPIC
Bilirubin Urine: NEGATIVE
GLUCOSE, UA: NEGATIVE
HGB URINE DIPSTICK: NEGATIVE
KETONES UR: NEGATIVE
LEUKOCYTES UA: NEGATIVE
NITRITE: NEGATIVE
PH: 5.5 (ref 5.0–8.0)
PROTEIN: NEGATIVE
Specific Gravity, Urine: 1.01 (ref 1.001–1.03)

## 2017-03-13 LAB — MICROALBUMIN / CREATININE URINE RATIO
Creatinine, Urine: 88 mg/dL (ref 20–370)
Microalb Creat Ratio: 2 mcg/mg creat (ref <30)
Microalb, Ur: 0.2 mg/dL

## 2017-03-13 LAB — VITAMIN D 25 HYDROXY (VIT D DEFICIENCY, FRACTURES): VIT D 25 HYDROXY: 40 ng/mL (ref 30–100)

## 2017-03-13 LAB — IRON, TOTAL/TOTAL IRON BINDING CAP
%SAT: 31 % (ref 15–60)
Iron: 102 ug/dL (ref 50–180)
TIBC: 326 mcg/dL (calc) (ref 250–425)

## 2017-03-13 LAB — MAGNESIUM: MAGNESIUM: 1.9 mg/dL (ref 1.5–2.5)

## 2017-03-13 LAB — PSA: PSA: 2.5 ng/mL (ref <=4.0)

## 2017-03-13 LAB — LIPID PANEL
Cholesterol: 161 mg/dL (ref <200)
HDL: 26 mg/dL — AB (ref >40)
LDL Cholesterol (Calc): 89 mg/dL (calc)
NON-HDL CHOLESTEROL (CALC): 135 mg/dL — AB (ref <130)
TRIGLYCERIDES: 342 mg/dL — AB (ref <150)
Total CHOL/HDL Ratio: 6.2 (calc) — ABNORMAL HIGH (ref <5.0)

## 2017-03-13 LAB — HEMOGLOBIN A1C
EAG (MMOL/L): 6.2 (calc)
HEMOGLOBIN A1C: 5.5 %{Hb} (ref <5.7)
Mean Plasma Glucose: 111 (calc)

## 2017-03-13 LAB — TESTOSTERONE: Testosterone: 504 ng/dL (ref 250–827)

## 2017-04-18 ENCOUNTER — Other Ambulatory Visit: Payer: Self-pay | Admitting: Physician Assistant

## 2017-04-18 DIAGNOSIS — M545 Low back pain: Principal | ICD-10-CM

## 2017-04-18 DIAGNOSIS — G8929 Other chronic pain: Secondary | ICD-10-CM

## 2017-04-28 ENCOUNTER — Other Ambulatory Visit: Payer: Self-pay | Admitting: Internal Medicine

## 2017-04-28 DIAGNOSIS — G8929 Other chronic pain: Secondary | ICD-10-CM | POA: Diagnosis not present

## 2017-04-28 DIAGNOSIS — M545 Low back pain: Secondary | ICD-10-CM | POA: Diagnosis not present

## 2017-04-30 DIAGNOSIS — G8929 Other chronic pain: Secondary | ICD-10-CM | POA: Diagnosis not present

## 2017-04-30 DIAGNOSIS — M545 Low back pain: Secondary | ICD-10-CM | POA: Diagnosis not present

## 2017-05-05 DIAGNOSIS — G8929 Other chronic pain: Secondary | ICD-10-CM | POA: Diagnosis not present

## 2017-05-05 DIAGNOSIS — M545 Low back pain: Secondary | ICD-10-CM | POA: Diagnosis not present

## 2017-05-07 DIAGNOSIS — M545 Low back pain: Secondary | ICD-10-CM | POA: Diagnosis not present

## 2017-05-07 DIAGNOSIS — G8929 Other chronic pain: Secondary | ICD-10-CM | POA: Diagnosis not present

## 2017-05-12 DIAGNOSIS — G8929 Other chronic pain: Secondary | ICD-10-CM | POA: Diagnosis not present

## 2017-05-12 DIAGNOSIS — M545 Low back pain: Secondary | ICD-10-CM | POA: Diagnosis not present

## 2017-05-14 DIAGNOSIS — M545 Low back pain: Secondary | ICD-10-CM | POA: Diagnosis not present

## 2017-05-14 DIAGNOSIS — G8929 Other chronic pain: Secondary | ICD-10-CM | POA: Diagnosis not present

## 2017-05-19 DIAGNOSIS — R972 Elevated prostate specific antigen [PSA]: Secondary | ICD-10-CM | POA: Diagnosis not present

## 2017-05-19 DIAGNOSIS — M545 Low back pain: Secondary | ICD-10-CM | POA: Diagnosis not present

## 2017-05-19 DIAGNOSIS — G8929 Other chronic pain: Secondary | ICD-10-CM | POA: Diagnosis not present

## 2017-05-21 DIAGNOSIS — G8929 Other chronic pain: Secondary | ICD-10-CM | POA: Diagnosis not present

## 2017-05-21 DIAGNOSIS — M545 Low back pain: Secondary | ICD-10-CM | POA: Diagnosis not present

## 2017-05-26 DIAGNOSIS — G8929 Other chronic pain: Secondary | ICD-10-CM | POA: Diagnosis not present

## 2017-05-26 DIAGNOSIS — M545 Low back pain: Secondary | ICD-10-CM | POA: Diagnosis not present

## 2017-05-28 DIAGNOSIS — M545 Low back pain: Secondary | ICD-10-CM | POA: Diagnosis not present

## 2017-05-28 DIAGNOSIS — G8929 Other chronic pain: Secondary | ICD-10-CM | POA: Diagnosis not present

## 2017-06-02 DIAGNOSIS — R972 Elevated prostate specific antigen [PSA]: Secondary | ICD-10-CM | POA: Diagnosis not present

## 2017-06-02 DIAGNOSIS — R3915 Urgency of urination: Secondary | ICD-10-CM | POA: Diagnosis not present

## 2017-06-02 DIAGNOSIS — G8929 Other chronic pain: Secondary | ICD-10-CM | POA: Diagnosis not present

## 2017-06-02 DIAGNOSIS — N401 Enlarged prostate with lower urinary tract symptoms: Secondary | ICD-10-CM | POA: Diagnosis not present

## 2017-06-02 DIAGNOSIS — M545 Low back pain: Secondary | ICD-10-CM | POA: Diagnosis not present

## 2017-06-08 DIAGNOSIS — M109 Gout, unspecified: Secondary | ICD-10-CM | POA: Diagnosis not present

## 2017-06-09 DIAGNOSIS — G8929 Other chronic pain: Secondary | ICD-10-CM | POA: Diagnosis not present

## 2017-06-09 DIAGNOSIS — M545 Low back pain: Secondary | ICD-10-CM | POA: Diagnosis not present

## 2017-07-16 DIAGNOSIS — M109 Gout, unspecified: Secondary | ICD-10-CM | POA: Diagnosis not present

## 2017-10-29 ENCOUNTER — Other Ambulatory Visit: Payer: Self-pay | Admitting: Internal Medicine

## 2018-02-23 DIAGNOSIS — D3132 Benign neoplasm of left choroid: Secondary | ICD-10-CM | POA: Diagnosis not present

## 2018-03-11 NOTE — Progress Notes (Signed)
Complete Physical  Assessment and Plan:  Encounter for general adult medical examination with abnormal findings  Labile hypertension - continue medications, DASH diet, exercise and monitor at home. Call if greater than 130/80.  -     CBC with Differential/Platelet -     BASIC METABOLIC PANEL WITH GFR -     Hepatic function panel -     TSH -     Urinalysis, Routine w reflex microscopic -     Microalbumin / creatinine urine ratio -     Uric acid  Hyperlipidemia, unspecified hyperlipidemia type -continue medications, check lipids, decrease fatty foods, increase activity.  -     Lipid panel  ESOPHAGEAL STRICTURE Continue PPI/H2 blocker, diet discussed  Prediabetes Discussed general issues about diabetes pathophysiology and management., Educational material distributed., Suggested low cholesterol diet., Encouraged aerobic exercise., Discussed foot care., Reminded to get yearly retinal exam. -     Hemoglobin A1c  Vitamin D deficiency -     VITAMIN D 25 Hydroxy (Vit-D Deficiency, Fractures)  Gilbert's syndrome -     Hepatic function panel  Chronic gout without tophus, unspecified cause, unspecified site Gout- recheck Uric acid as needed, Diet discussed, continue medications. -     Uric acid  Insomnia, unspecified type Insomnia- good sleep hygiene discussed, increase day time activity, try melatonin or benadryl if this does not help we will call in sleep medication.   Anxiety-  continue medications, stress management techniques discussed, increase water, good sleep hygiene discussed, increase exercise, and increase veggies.   Gastroesophageal reflux disease, esophagitis presence not specified Continue PPI/H2 blocker, diet discussed  Medication management -     Magnesium   Discussed med's effects and SE's. Screening labs and tests as requested with regular follow-up as recommended.  HPI Patient presents for a complete physical.   He has left achilles tendon/left heel  pain, will last 3-4 days and will get better, will happen every 1-2 months.   His blood pressure has been controlled at home, today their BP is BP: 126/84 He does not workout. He denies chest pain, shortness of breath, dizziness.  He is on cholesterol medication and denies myalgias. His cholesterol is at goal. The cholesterol last visit was:   Lab Results  Component Value Date   CHOL 161 03/11/2017   HDL 26 (L) 03/11/2017   LDLCALC 89 03/11/2017   TRIG 342 (H) 03/11/2017   CHOLHDL 6.2 (H) 03/11/2017   He has been working on diet and exercise for prediabetes, he is on bASA, he is not on ACE/ARB and denies foot ulcerations, hyperglycemia, hypoglycemia , increased appetite, nausea, paresthesia of the feet, polydipsia, polyuria, visual disturbances, vomiting and weight loss. Last A1C in the office was:  Lab Results  Component Value Date   HGBA1C 5.5 03/11/2017   Patient is on Vitamin D supplement.   Lab Results  Component Value Date   VD25OH 40 03/11/2017   Patient is NOT on allopurinol for gout and does not report a recent flare.  Lab Results  Component Value Date   LABURIC 7.4 03/11/2017     BMI is Body mass index is 35.36 kg/m., he is working on diet and exercise. Wt Readings from Last 3 Encounters:  03/12/18 275 lb 6.4 oz (124.9 kg)  03/11/17 262 lb (118.8 kg)  03/06/16 270 lb (122.5 kg)   Has had some GERD, does take ibuprofen for lower back. Has gotten off prilosec and on zantac.    Current Medications:  Current Outpatient Medications  on File Prior to Visit  Medication Sig Dispense Refill  . finasteride (PROSCAR) 5 MG tablet Take 5 mg by mouth daily.    . RaNITidine HCl (ZANTAC 150 MAXIMUM STRENGTH PO) Take 150 mg by mouth 2 (two) times daily.     No current facility-administered medications on file prior to visit.     Health Maintenance:  Immunization History  Administered Date(s) Administered  . Pneumococcal-Unspecified 11/06/1995  . Td 11/01/2004  . Tdap  03/01/2015   Tetanus: 2016 Influenza: get in Oct Colonoscopy: 2016 Eye Exam: Once yearly Dentist:  Twice yearly  Patient Care Team: Unk Pinto, MD as PCP - General (Internal Medicine) [name], MD as Attending Physician (Urology) Ladene Artist, MD as Consulting Physician (Gastroenterology) Kary Kos, MD as Consulting Physician (Neurosurgery)  Medical History:  Past Medical History:  Diagnosis Date  . Allergy   . Anemia   . Anxiety   . DDD (degenerative disc disease)   . GERD (gastroesophageal reflux disease)   . Gilbert's syndrome    pt denies dx  . Gout   . Hyperlipidemia   . Hypertension     Allergies No Known Allergies  SURGICAL HISTORY He  has a past surgical history that includes Wisdom tooth extraction (Bilateral); Cholesteatoma excision; Vasectomy; Cholecystectomy; prostrate; Prostate biopsy; and Upper gastrointestinal endoscopy (2011). FAMILY HISTORY His family history includes Diabetes in his father; Hypertension in his father; Lung cancer in his mother. SOCIAL HISTORY He  reports that he has never smoked. He has never used smokeless tobacco. He reports that he drinks alcohol. He reports that he does not use drugs.   Review of Systems:  Review of Systems  Constitutional: Negative for chills, fever and malaise/fatigue.  HENT: Negative for congestion, ear pain and sore throat.   Eyes: Negative.   Respiratory: Negative for cough, shortness of breath and wheezing.   Cardiovascular: Negative for chest pain, palpitations and leg swelling.  Gastrointestinal: Negative for abdominal pain, blood in stool, constipation, diarrhea, heartburn and melena.  Genitourinary: Negative.   Skin: Negative for itching and rash.  Neurological: Negative for dizziness, sensory change, loss of consciousness and headaches.  Psychiatric/Behavioral: Negative for depression. The patient is not nervous/anxious and does not have insomnia.     Physical Exam: Estimated body  mass index is 35.36 kg/m as calculated from the following:   Height as of this encounter: 6\' 2"  (1.88 m).   Weight as of this encounter: 275 lb 6.4 oz (124.9 kg). BP 126/84   Pulse 71   Temp 97.6 F (36.4 C)   Resp 16   Ht 6\' 2"  (1.88 m)   Wt 275 lb 6.4 oz (124.9 kg)   SpO2 98%   BMI 35.36 kg/m   General Appearance: Well nourished, in no apparent distress.  Eyes: PERRLA, EOMs, conjunctiva no swelling or erythema ENT/Mouth: Ear canals clear bilaterally with no erythema, swelling, discharge.  TMs normal bilaterally with no erythema, bulging, or retractions.  Oropharynx clear and moist with no exudate, swelling, or erythema.  Dentition normal.   Neck: Supple, thyroid normal. No bruits, JVD, cervical adenopathy Respiratory: Respiratory effort normal, BS equal bilaterally without rales, rhonchi, wheezing or stridor.  Cardio: RRR without murmurs, rubs or gallops. Brisk peripheral pulses without edema.  Chest: symmetric, with normal excursions Abdomen: Soft, nontender, no guarding, rebound, hernias, masses, or organomegaly. Genitourinary: Deferred to urology Musculoskeletal: Full ROM all peripheral extremities,5/5 strength, and normal gait.  Skin: right posterior knee with erythematous dry rash, Warm, dry without rashes,  lesions, ecchymosis. Neuro: A&Ox3, Cranial nerves intact, reflexes equal bilaterally. Normal muscle tone, no cerebellar symptoms. Sensation intact.  Psych: Normal affect, Insight and Judgment appropriate.   EKG: WNL no changes.  Over 40 minutes of exam, counseling, chart review and critical decision making was performed  Vicie Mutters 2:15 PM [facility]

## 2018-03-12 ENCOUNTER — Encounter: Payer: Self-pay | Admitting: Physician Assistant

## 2018-03-12 ENCOUNTER — Ambulatory Visit: Payer: 59 | Admitting: Physician Assistant

## 2018-03-12 VITALS — BP 126/84 | HR 71 | Temp 97.6°F | Resp 16 | Ht 74.0 in | Wt 275.4 lb

## 2018-03-12 DIAGNOSIS — M1A9XX0 Chronic gout, unspecified, without tophus (tophi): Secondary | ICD-10-CM

## 2018-03-12 DIAGNOSIS — K219 Gastro-esophageal reflux disease without esophagitis: Secondary | ICD-10-CM

## 2018-03-12 DIAGNOSIS — Z79899 Other long term (current) drug therapy: Secondary | ICD-10-CM

## 2018-03-12 DIAGNOSIS — R7303 Prediabetes: Secondary | ICD-10-CM

## 2018-03-12 DIAGNOSIS — E785 Hyperlipidemia, unspecified: Secondary | ICD-10-CM | POA: Diagnosis not present

## 2018-03-12 DIAGNOSIS — Z0001 Encounter for general adult medical examination with abnormal findings: Secondary | ICD-10-CM

## 2018-03-12 DIAGNOSIS — R0989 Other specified symptoms and signs involving the circulatory and respiratory systems: Secondary | ICD-10-CM

## 2018-03-12 DIAGNOSIS — E559 Vitamin D deficiency, unspecified: Secondary | ICD-10-CM

## 2018-03-12 DIAGNOSIS — K222 Esophageal obstruction: Secondary | ICD-10-CM

## 2018-03-12 DIAGNOSIS — Z0000 Encounter for general adult medical examination without abnormal findings: Secondary | ICD-10-CM

## 2018-03-12 DIAGNOSIS — F419 Anxiety disorder, unspecified: Secondary | ICD-10-CM

## 2018-03-12 NOTE — Patient Instructions (Addendum)
11 Tips to Follow:  1. No caffeine after 3pm: Avoid beverages with caffeine (soda, tea, energy drinks, etc.) especially after 3pm. 2. Don't go to bed hungry: Have your evening meal at least 3 hrs. before going to sleep. It's fine to have a small bedtime snack such as a glass of milk and a few crackers but don't have a big meal. 3. Have a nightly routine before bed: Plan on "winding down" before you go to sleep. Begin relaxing about 1 hour before you go to bed. Try doing a quiet activity such as listening to calming music, reading a book or meditating. 4. Turn off the TV and ALL electronics including video games, tablets, laptops, etc. 1 hour before sleep, and keep them out of the bedroom. 5. Turn off your cell phone and all notifications (new email and text alerts) or even better, leave your phone outside your room while you sleep. Studies have shown that a part of your brain continues to respond to certain lights and sounds even while you're still asleep. 6. Make your bedroom quiet, dark and cool. If you can't control the noise, try wearing earplugs or using a fan to block out other sounds. 7. Practice relaxation techniques. Try reading a book or meditating or drain your brain by writing a list of what you need to do the next day. 8. Don't nap unless you feel sick: you'll have a better night's sleep. 9. Don't smoke, or quit if you do. Nicotine, alcohol, and marijuana can all keep you awake. Talk to your health care provider if you need help with substance use. 10. Most importantly, wake up at the same time every day (or within 1 hour of your usual wake up time) EVEN on the weekends. A regular wake up time promotes sleep hygiene and prevents sleep problems. 11. Reduce exposure to bright light in the last three hours of the day before going to sleep. Maintaining good sleep hygiene and having good sleep habits lower your risk of developing sleep problems. Getting better sleep can also improve your  concentration and alertness. Try the simple steps in this guide. If you still have trouble getting enough rest, make an appointment with your health care provider.   Achilles Tendinitis Achilles tendinitis is inflammation of the tough, cord-like band that attaches the lower leg muscles to the heel bone (Achilles tendon). This is usually caused by overusing the tendon and the ankle joint. Achilles tendinitis usually gets better over time with treatment and caring for yourself at home. It can take weeks or months to heal completely. What are the causes? This condition may be caused by:  A sudden increase in exercise or activity, such as running.  Doing the same exercises or activities (such as jumping) over and over.  Not warming up calf muscles before exercising.  Exercising in shoes that are worn out or not made for exercise.  Having arthritis or a bone growth (spur) on the back of the heel bone. This can rub against the tendon and hurt it.  Age-related wear and tear. Tendons become less flexible with age and more likely to be injured.  What are the signs or symptoms? Common symptoms of this condition include:  Pain in the Achilles tendon or in the back of the leg, just above the heel. The pain usually gets worse with exercise.  Stiffness or soreness in the back of the leg, especially in the morning.  Swelling of the skin over the Achilles tendon.  Thickening of  the tendon.  Bone spurs at the bottom of the Achilles tendon, near the heel.  Trouble standing on tiptoe.  How is this diagnosed? This condition is diagnosed based on your symptoms and a physical exam. You may have tests, including:  X-rays.  MRI.  How is this treated? The goal of treatment is to relieve symptoms and help your injury heal. Treatment may include:  Decreasing or stopping activities that caused the tendinitis. This may mean switching to low-impact exercises like biking or swimming.  Icing the  injured area.  Doing physical therapy, including strengthening and stretching exercises.  NSAIDs to help relieve pain and swelling.  Using supportive shoes, wraps, heel lifts, or a walking boot (air cast).  Surgery. This may be done if your symptoms do not improve after 6 months.  Using high-energy shock wave impulses to stimulate the healing process (extracorporeal shock wave therapy). This is rare.  Injection of medicines to help relieve inflammation (corticosteroids). This is rare.  Follow these instructions at home: If you have an air cast:  Wear the cast as told by your health care provider. Remove it only as told by your health care provider.  Loosen the cast if your toes tingle, become numb, or turn cold and blue. Activity  Gradually return to your normal activities once your health care provider approves. Do not do activities that cause pain. ? Consider doing low-impact exercises, like cycling or swimming.  If you have an air cast, ask your health care provider when it is safe for you to drive.  If physical therapy was prescribed, do exercises as told by your health care provider or physical therapist. Managing pain, stiffness, and swelling  Raise (elevate) your foot above the level of your heart while you are sitting or lying down.  Move your toes often to avoid stiffness and to lessen swelling.  If directed, put ice on the injured area: ? Put ice in a plastic bag. ? Place a towel between your skin and the bag. ? Leave the ice on for 20 minutes, 2-3 times a day General instructions  If directed, wrap your foot with an elastic bandage or other wrap. This can help keep your tendon from moving too much while it heals. Your health care provider will show you how to wrap your foot correctly.  Wear supportive shoes or heel lifts only as told by your health care provider.  Take over-the-counter and prescription medicines only as told by your health care provider.  Keep  all follow-up visits as told by your health care provider. This is important. Contact a health care provider if:  You have symptoms that gets worse.  You have pain that does not get better with medicine.  You develop new, unexplained symptoms.  You develop warmth and swelling in your foot.  You have a fever. Get help right away if:  You have a sudden popping sound or sensation in your Achilles tendon followed by severe pain.  You cannot move your toes or foot.  You cannot put any weight on your foot. Summary  Achilles tendinitis is inflammation of the tough, cord-like band that attaches the lower leg muscles to the heel bone (Achilles tendon).  This condition is usually caused by overusing the tendon and the ankle joint. It can also be caused by arthritis or normal aging.  The most common symptoms of this condition include pain, swelling, or stiffness in the Achilles tendon or in the back of the leg.  This condition  is usually treated with rest, NSAIDs, and physical therapy. This information is not intended to replace advice given to you by your health care provider. Make sure you discuss any questions you have with your health care provider. Document Released: 04/10/2005 Document Revised: 05/20/2016 Document Reviewed: 05/20/2016 Elsevier Interactive Patient Education  2017 [place]   Achilles Tendinitis Rehab Ask your health care provider which exercises are safe for you. Do exercises exactly as told by your health care provider and adjust them as directed. It is normal to feel mild stretching, pulling, tightness, or discomfort as you do these exercises, but you should stop right away if you feel sudden pain or your pain gets worse. Do not begin these exercises until told by your health care provider. Stretching and range of motion exercises These exercises warm up your muscles and joints and improve the movement and flexibility of your ankle. These exercises also help to  relieve pain, numbness, and tingling. Exercise A: Standing wall calf stretch, knee straight  1. Stand with your hands against a wall. 2. Extend your __________ leg behind you and bend your front knee slightly. Keep both of your heels on the floor. 3. Point the toes of your back foot slightly inward. 4. Keeping your heels on the floor and your back knee straight, shift your weight toward the wall. Do not allow your back to arch. You should feel a gentle stretch in your calf. 5. Hold this position for seconds. Repeat __________ times. Complete this stretch __________ times per day. Exercise B: Standing wall calf stretch, knee bent 1. Stand with your hands against a wall. 2. Extend your __________ leg behind you, and bend your front knee slightly. Keep both of your heels on the floor. 3. Point the toes of your back foot slightly inward. 4. Keeping your heels on the floor, unlock your back knee so that it is bent. You should feel a gentle stretch deep in your calf. 5. Hold this position for __________ seconds. Repeat __________ times. Complete this stretch __________ times per day. Strengthening exercises These exercises build strength and control of your ankle. Endurance is the ability to use your muscles for a long time, even after they get tired. Exercise C: Plantar flexion with band  1. Sit on the floor with your __________ leg extended. You may put a pillow under your calf to give your foot more room to move. 2. Loop a rubber exercise band or tube around the ball of your __________ foot. The ball of your foot is on the walking surface, right under your toes. The band or tube should be slightly tense when your foot is relaxed. If the band or tube slips, you can put on your shoe or put a washcloth between the band and your foot to help it stay in place. 3. Slowly point your toes downward, pushing them away from you. 4. Hold this position for __________ seconds. 5. Slowly release the tension in  the band or tube, controlling smoothly until your foot is back to the starting position. Repeat __________ times. Complete this exercise __________ times per day. Exercise D: Heel raise with eccentric lower  1. Stand on a step with the balls of your feet. The ball of your foot is on the walking surface, right under your toes. ? Do not put your heels on the step. ? For balance, rest your hands on the wall or on a railing. 2. Rise up onto the balls of your feet. 3. Keeping your  heels up, shift all of your weight to your __________ leg and pick up your other leg. 4. Slowly lower your __________ leg so your heel drops below the level of the step. 5. Put down your foot. If told by your health care provider, build up to:  3 sets of 15 repetitions while keeping your knees straight.  3 sets of 15 repetitions while keeping your knees bent as far as told by your health care provider.  Complete this exercise __________ times per day. If this exercise is too easy, try doing it while wearing a backpack with weights in it. Balance exercises These exercises improve or maintain your balance. Balance is important in preventing falls. Exercise E: Single leg stand 1. Without shoes, stand near a railing or in a door frame. Hold on to the railing or door frame as needed. 2. Stand on your __________ foot. Keep your big toe down on the floor and try to keep your arch lifted. 3. Hold this position for __________ seconds. Repeat __________ times. Complete this exercise __________ times per day. If this exercise is too easy, you can try it with your eyes closed or while standing on a pillow. This information is not intended to replace advice given to you by your health care provider. Make sure you discuss any questions you have with your health care provider. Document Released: 01/30/2005 Document Revised: 03/07/2016 Document Reviewed: 03/07/2015 Elsevier Interactive Patient Education  [employer].     When it comes to diets, agreement about the perfect plan isn't easy to find, even among the experts. Experts at the [place] developed an idea known as the Healthy Eating Plate. Just imagine a plate divided into logical, healthy portions.  The emphasis is on diet quality:  Load up on vegetables and fruits - one-half of your plate: Aim for color and variety, and remember that potatoes don't count.  Go for whole grains - one-quarter of your plate: Whole wheat, barley, wheat berries, quinoa, oats, brown rice, and foods made with them. If you want pasta, go with whole wheat pasta.  Protein power - one-quarter of your plate: Fish, chicken, beans, and nuts are all healthy, versatile protein sources. Limit red meat.  The diet, however, does go beyond the plate, offering a few other suggestions.  Use healthy plant oils, such as olive, canola, soy, corn, sunflower and peanut. Check the labels, and avoid partially hydrogenated oil, which have unhealthy trans fats.  If you're thirsty, drink water. Coffee and tea are good in moderation, but skip sugary drinks and limit milk and dairy products to one or two daily servings.  The type of carbohydrate in the diet is more important than the amount. Some sources of carbohydrates, such as vegetables, fruits, whole grains, and beans-are healthier than others.  Finally, stay active.

## 2018-03-13 LAB — COMPLETE METABOLIC PANEL WITH GFR
AG RATIO: 1.8 (calc) (ref 1.0–2.5)
ALBUMIN MSPROF: 4.1 g/dL (ref 3.6–5.1)
ALT: 17 U/L (ref 9–46)
AST: 19 U/L (ref 10–35)
Alkaline phosphatase (APISO): 81 U/L (ref 40–115)
BUN: 11 mg/dL (ref 7–25)
CALCIUM: 9.2 mg/dL (ref 8.6–10.3)
CO2: 26 mmol/L (ref 20–32)
CREATININE: 1.12 mg/dL (ref 0.70–1.33)
Chloride: 106 mmol/L (ref 98–110)
GFR, EST AFRICAN AMERICAN: 86 mL/min/{1.73_m2} (ref >=60)
GFR, EST NON AFRICAN AMERICAN: 74 mL/min/{1.73_m2} (ref >=60)
GLOBULIN: 2.3 g/dL (ref 1.9–3.7)
Glucose, Bld: 139 mg/dL — ABNORMAL HIGH (ref 65–99)
POTASSIUM: 4.1 mmol/L (ref 3.5–5.3)
SODIUM: 140 mmol/L (ref 135–146)
TOTAL PROTEIN: 6.4 g/dL (ref 6.1–8.1)
Total Bilirubin: 0.5 mg/dL (ref 0.2–1.2)

## 2018-03-13 LAB — HEMOGLOBIN A1C
HEMOGLOBIN A1C: 5.8 %{Hb} — AB (ref <5.7)
MEAN PLASMA GLUCOSE: 120 (calc)
eAG (mmol/L): 6.6 (calc)

## 2018-03-13 LAB — CBC WITH DIFFERENTIAL/PLATELET
BASOS PCT: 0.5 %
Basophils Absolute: 39 cells/uL (ref 0–200)
Eosinophils Absolute: 254 cells/uL (ref 15–500)
Eosinophils Relative: 3.3 %
HCT: 41.8 % (ref 38.5–50.0)
Hemoglobin: 13.9 g/dL (ref 13.2–17.1)
LYMPHS ABS: 1617 {cells}/uL (ref 850–3900)
MCH: 29 pg (ref 27.0–33.0)
MCHC: 33.3 g/dL (ref 32.0–36.0)
MCV: 87.1 fL (ref 80.0–100.0)
MPV: 9.7 fL (ref 7.5–12.5)
Monocytes Relative: 6.7 %
NEUTROS PCT: 68.5 %
Neutro Abs: 5275 cells/uL (ref 1500–7800)
Platelets: 268 10*3/uL (ref 140–400)
RBC: 4.8 10*6/uL (ref 4.20–5.80)
RDW: 13.1 % (ref 11.0–15.0)
Total Lymphocyte: 21 %
WBC: 7.7 10*3/uL (ref 3.8–10.8)
WBCMIX: 516 {cells}/uL (ref 200–950)

## 2018-03-13 LAB — LIPID PANEL
CHOL/HDL RATIO: 5.1 (calc) — AB (ref <5.0)
Cholesterol: 173 mg/dL (ref <200)
HDL: 34 mg/dL — AB (ref >40)
LDL CHOLESTEROL (CALC): 107 mg/dL — AB
NON-HDL CHOLESTEROL (CALC): 139 mg/dL — AB (ref <130)
TRIGLYCERIDES: 207 mg/dL — AB (ref <150)

## 2018-03-13 LAB — URINALYSIS, ROUTINE W REFLEX MICROSCOPIC
BILIRUBIN URINE: NEGATIVE
Glucose, UA: NEGATIVE
Hgb urine dipstick: NEGATIVE
KETONES UR: NEGATIVE
Leukocytes, UA: NEGATIVE
NITRITE: NEGATIVE
PH: 5.5 (ref 5.0–8.0)
Protein, ur: NEGATIVE
SPECIFIC GRAVITY, URINE: 1.015 (ref 1.001–1.03)

## 2018-03-13 LAB — URIC ACID: URIC ACID, SERUM: 8.4 mg/dL — AB (ref 4.0–8.0)

## 2018-03-13 LAB — VITAMIN D 25 HYDROXY (VIT D DEFICIENCY, FRACTURES): Vit D, 25-Hydroxy: 26 ng/mL — ABNORMAL LOW (ref 30–100)

## 2018-03-13 LAB — MICROALBUMIN / CREATININE URINE RATIO: CREATININE, URINE: 109 mg/dL (ref 20–320)

## 2018-03-13 LAB — TSH: TSH: 1.67 mIU/L (ref 0.40–4.50)

## 2018-03-13 LAB — MAGNESIUM: MAGNESIUM: 2 mg/dL (ref 1.5–2.5)

## 2018-03-15 ENCOUNTER — Other Ambulatory Visit: Payer: Self-pay | Admitting: Internal Medicine

## 2018-03-15 DIAGNOSIS — M1A9XX0 Chronic gout, unspecified, without tophus (tophi): Secondary | ICD-10-CM

## 2018-03-15 MED ORDER — ALLOPURINOL 300 MG PO TABS: ORAL_TABLET | ORAL | 1 refills | Status: DC

## 2018-05-26 DIAGNOSIS — N401 Enlarged prostate with lower urinary tract symptoms: Secondary | ICD-10-CM | POA: Diagnosis not present

## 2018-05-26 DIAGNOSIS — R3915 Urgency of urination: Secondary | ICD-10-CM | POA: Diagnosis not present

## 2018-06-01 DIAGNOSIS — R3915 Urgency of urination: Secondary | ICD-10-CM | POA: Diagnosis not present

## 2018-06-01 DIAGNOSIS — N401 Enlarged prostate with lower urinary tract symptoms: Secondary | ICD-10-CM | POA: Diagnosis not present

## 2018-06-01 DIAGNOSIS — R972 Elevated prostate specific antigen [PSA]: Secondary | ICD-10-CM | POA: Diagnosis not present

## 2018-06-29 NOTE — Progress Notes (Signed)
FOLLOW UP  Assessment and Plan:    Diagnoses and all orders for this visit:  Labile hypertension - continue medications, DASH diet, exercise and monitor at home. Call if greater than 130/80.  Hyperlipidemia, unspecified hyperlipidemia type -     Lipid panel  ESOPHAGEAL STRICTURE Doing well at this time Monitor symptoms  Gastroesophageal reflux disease, esophagitis presence not specified -     Magnesium Continue current regiment  Vitamin D deficiency -     VITAMIN D 25 Hydroxy (Vit-D Deficiency)  Chronic gout without tophus, unspecified cause, unspecified site -Avoid foods high in purines, discussed with aptient -Continue Allopurinal daily -Will check Uric acid today  Insomnia, unspecified type Doing well at this time Not taking any medication for this  Gilbert's syndrome -     COMPLETE METABOLIC PANEL WITH GFR  Anxiety Doing well at this time Stress management techniques discussed, increase water, good sleep hygiene discussed, increase exercise, and increase veggies.   Alopecia Discussed decreasing dose -     finasteride (PROPECIA) 1 MG tablet; Take 1 tablet (1 mg total) by mouth daily.  Gout, unspecified cause, unspecified chronicity, unspecified site -     Uric acid -     colchicine 0.6 MG tablet; Take two tablets (1.2mg ) at onset of gout symptoms.  Then take one tablet one hour later x1.  Class 1 obesity due to excess calories with BMI 34 to 24.9 in adult Long discussion about weight loss, diet, and exercise Recommended diet heavy veggies and low in animal meats, cheeses, and dairy products, appropriate calorie intake Patient will work on diet and increasing exercise/activity. Will follow up in 3 months  Vitamin D Def At goal at last visit; continue supplementation to maintain goal of 70-100 Defer Vit D level  Prediabetes -     Hemoglobin A1c -     Insulin, random   Medication management -     CBC with Differential/Platelet -     COMPLETE METABOLIC  PANEL WITH GFR -     Magnesium -     Lipid panel -     TSH -     Hemoglobin A1c -     Insulin, random -     VITAMIN D 25 Hydroxy (Vit-D Deficiency, Fractures)      Continue diet and meds as discussed. Further disposition pending results of labs. Discussed med's effects and SE's.   Over 30 minutes of exam, counseling, chart review, and critical decision making was performed.   Future Appointments  Date Time Provider [place]  09/29/2018  3:30 PM Garnet Sierras, NP [facility] None  03/18/2019  2:00 PM Vicie Mutters, PA-C [facility] None    ----------------------------------------------------------------------------------------------------------------------  HPI 52 y.o. male  presents for 3 month follow up on hypertension, cholesterol, diabetes, weight and vitamin D deficiency.   Reports that he is taking 2.5mg  of proscar, halt 5mg  tablet.  He reports that he is having some breast tenderness bilaterally, left worse.  Reports it comes and goes.  It is not bothering him today.  Denies any erythema, fevers, open lesions or nipple discharge.  He would like to discuss decreasing this medication.  Reports that he has not had a gout attack since January.  He is taking allopurinal daily. He is monitoring his diet but does not really know triggers for him.    BMI is Body mass index is 34.87 kg/m., he has not been working on diet and exercise. Wt Readings from Last 3 Encounters:  06/30/18 271 lb 9.6  oz (123.2 kg)  03/12/18 275 lb 6.4 oz (124.9 kg)  03/11/17 262 lb (118.8 kg)    His blood pressure has been controlled and does not check B/P at home, today his BP: 122/86  He does not workout. He denies chest pain, shortness of breath, dizziness.   He is not on cholesterol medication His cholesterol is not at goal. The cholesterol last visit was:   Lab Results  Component Value Date   CHOL 184 06/30/2018   HDL 35 (L) 06/30/2018   LDLCALC 115 (H) 06/30/2018   TRIG 225 (H)  06/30/2018   CHOLHDL 5.3 (H) 06/30/2018    He has not been working on diet and exercise for prediabetes, and denies hyperglycemia, hypoglycemia , nausea, polydipsia, polyuria, visual disturbances and vomiting. Last A1C in the office was:  Lab Results  Component Value Date   HGBA1C 5.6 06/30/2018   Patient is on Vitamin D supplement.   Lab Results  Component Value Date   VD25OH 53 06/30/2018        Current Medications:  Current Outpatient Medications on File Prior to Visit  Medication Sig  . allopurinol (ZYLOPRIM) 300 MG tablet Take 1 tablet daily to prevent a painful Gout Attack  . cholecalciferol (VITAMIN D) 1000 units tablet Take 10,000 Units by mouth daily.  . finasteride (PROSCAR) 5 MG tablet Take 5 mg by mouth daily.   No current facility-administered medications on file prior to visit.      Allergies: No Known Allergies   Medical History:  Past Medical History:  Diagnosis Date  . Allergy   . Anemia   . Anxiety   . DDD (degenerative disc disease)   . GERD (gastroesophageal reflux disease)   . Gilbert's syndrome    pt denies dx  . Gout   . Hyperlipidemia   . Hypertension    Family history- Reviewed and unchanged Social history- Reviewed and unchanged   Review of Systems:  ROS    Physical Exam: BP 122/86   Pulse 60   Temp 98.4 F (36.9 C)   Ht 6\' 2"  (1.88 m)   Wt 271 lb 9.6 oz (123.2 kg)   SpO2 99%   BMI 34.87 kg/m  Wt Readings from Last 3 Encounters:  06/30/18 271 lb 9.6 oz (123.2 kg)  03/12/18 275 lb 6.4 oz (124.9 kg)  03/11/17 262 lb (118.8 kg)   General Appearance: Well nourished, in no apparent distress. Eyes: PERRLA, EOMs, conjunctiva no swelling or erythema Sinuses: No Frontal/maxillary tenderness ENT/Mouth: Ext aud canals clear, TMs without erythema, bulging. No erythema, swelling, or exudate on post pharynx.  Tonsils not swollen or erythematous. Hearing normal.  Neck: Supple, thyroid normal.  Respiratory: Respiratory effort normal,  BS equal bilaterally without rales, rhonchi, wheezing or stridor.  Cardio: RRR with no MRGs. Brisk peripheral pulses without edema.  Abdomen: Soft, + BS.  Non tender, no guarding, rebound, hernias, masses. Lymphatics: Non tender without lymphadenopathy.  Musculoskeletal: Full ROM, 5/5 strength, Normal gait Skin: Warm, dry without rashes, lesions, ecchymosis.  Neuro: Cranial nerves intact. No cerebellar symptoms.  Psych: Awake and oriented X 3, normal affect, Insight and Judgment appropriate.    Names of Other Physician/Practitioners you currently use: 1. [facility] here for primary care 2. Eye Exam 2019 3. Dentist 2019 Patient Care Team: Unk Pinto, MD as PCP - General (Internal Medicine) [name], MD as Attending Physician (Urology) Ladene Artist, MD as Consulting Physician (Gastroenterology) Kary Kos, MD as Consulting Physician (  Neurosurgery)    Screening Tests: Immunization History  Administered Date(s) Administered  . Influenza-Unspecified 04/12/2018  . Pneumococcal-Unspecified 11/06/1995  . Td 11/01/2004  . Tdap 03/01/2015    Preventative care: Last colonoscopy:2016   Vaccinations: TD or Tdap: 2016  Influenza: 2019  Shingles: Due, discussed with patient   Garnet Sierras, NP 8:09 PM [facility]

## 2018-06-30 ENCOUNTER — Encounter: Payer: Self-pay | Admitting: Adult Health Nurse Practitioner

## 2018-06-30 ENCOUNTER — Encounter (INDEPENDENT_AMBULATORY_CARE_PROVIDER_SITE_OTHER): Payer: Self-pay

## 2018-06-30 ENCOUNTER — Ambulatory Visit: Payer: 59 | Admitting: Adult Health Nurse Practitioner

## 2018-06-30 VITALS — BP 122/86 | HR 60 | Temp 98.4°F | Ht 74.0 in | Wt 271.6 lb

## 2018-06-30 DIAGNOSIS — R0989 Other specified symptoms and signs involving the circulatory and respiratory systems: Secondary | ICD-10-CM

## 2018-06-30 DIAGNOSIS — E6609 Other obesity due to excess calories: Secondary | ICD-10-CM

## 2018-06-30 DIAGNOSIS — Z6834 Body mass index (BMI) 34.0-34.9, adult: Secondary | ICD-10-CM

## 2018-06-30 DIAGNOSIS — F419 Anxiety disorder, unspecified: Secondary | ICD-10-CM

## 2018-06-30 DIAGNOSIS — K222 Esophageal obstruction: Secondary | ICD-10-CM

## 2018-06-30 DIAGNOSIS — E785 Hyperlipidemia, unspecified: Secondary | ICD-10-CM

## 2018-06-30 DIAGNOSIS — G4700 Insomnia, unspecified: Secondary | ICD-10-CM

## 2018-06-30 DIAGNOSIS — R7303 Prediabetes: Secondary | ICD-10-CM

## 2018-06-30 DIAGNOSIS — M109 Gout, unspecified: Secondary | ICD-10-CM

## 2018-06-30 DIAGNOSIS — E66811 Obesity, class 1: Secondary | ICD-10-CM

## 2018-06-30 DIAGNOSIS — E559 Vitamin D deficiency, unspecified: Secondary | ICD-10-CM

## 2018-06-30 DIAGNOSIS — M1A9XX0 Chronic gout, unspecified, without tophus (tophi): Secondary | ICD-10-CM

## 2018-06-30 DIAGNOSIS — L659 Nonscarring hair loss, unspecified: Secondary | ICD-10-CM

## 2018-06-30 DIAGNOSIS — K219 Gastro-esophageal reflux disease without esophagitis: Secondary | ICD-10-CM

## 2018-06-30 DIAGNOSIS — Z79899 Other long term (current) drug therapy: Secondary | ICD-10-CM | POA: Diagnosis not present

## 2018-06-30 MED ORDER — FINASTERIDE 1 MG PO TABS: 1.0000 mg | ORAL_TABLET | Freq: Every day | ORAL | 3 refills | Status: AC

## 2018-06-30 MED ORDER — COLCHICINE 0.6 MG PO TABS: ORAL_TABLET | ORAL | 2 refills | Status: DC

## 2018-06-30 NOTE — Patient Instructions (Addendum)
Today we discussed decrease Proscar to 1mg  daily Monitor side effects Please contact office with new or worsening symptoms   Shingles vaccination: Shingrix can get at any pharmacy  We will contact you in 1-2 days lab results.    Preventive Care for Adults  A healthy lifestyle and preventive care can promote health and wellness. Preventive health guidelines for men include the following key practices:  A routine yearly physical is a good way to check with your health care provider about your health and preventative screening. It is a chance to share any concerns and updates on your health and to receive a thorough exam.  Visit your dentist for a routine exam and preventative care every 6 months. Brush your teeth twice a day and floss once a day. Good oral hygiene prevents tooth decay and gum disease.  The frequency of eye exams is based on your age, health, family medical history, use of contact lenses, and other factors. Follow your health care provider's recommendations for frequency of eye exams.  Eat a healthy diet. Foods such as vegetables, fruits, whole grains, low-fat dairy products, and lean protein foods contain the nutrients you need without too many calories. Decrease your intake of foods high in solid fats, added sugars, and salt. Eat the right amount of calories for you. Get information about a proper diet from your health care provider, if necessary.  Regular physical exercise is one of the most important things you can do for your health. Most adults should get at least 150 minutes of moderate-intensity exercise (any activity that increases your heart rate and causes you to sweat) each week. In addition, most adults need muscle-strengthening exercises on 2 or more days a week.  Maintain a healthy weight. The body mass index (BMI) is a screening tool to identify possible weight problems. It provides an estimate of body fat based on height and weight. Your health care  provider can find your BMI and can help you achieve or maintain a healthy weight. For adults 20 years and older:  A BMI below 18.5 is considered underweight.  A BMI of 18.5 to 24.9 is normal.  A BMI of 25 to 29.9 is considered overweight.  A BMI of 30 and above is considered obese.  Maintain normal blood lipids and cholesterol levels by exercising and minimizing your intake of saturated fat. Eat a balanced diet with plenty of fruit and vegetables. Blood tests for lipids and cholesterol should begin at age 69 and be repeated every 5 years. If your lipid or cholesterol levels are high, you are over 50, or you are at high risk for heart disease, you may need your cholesterol levels checked more frequently. Ongoing high lipid and cholesterol levels should be treated with medicines if diet and exercise are not working.  If you smoke, find out from your health care provider how to quit. If you do not use tobacco, do not start.  Lung cancer screening is recommended for adults aged 19-80 years who are at high risk for developing lung cancer because of a history of smoking. A yearly low-dose CT scan of the lungs is recommended for people who have at least a 30-pack-year history of smoking and are a current smoker or have quit within the past 15 years. A pack year of smoking is smoking an average of 1 pack of cigarettes a day for 1 year (for example: 1 pack a day for 30 years or 2 packs a day for 15 years). Yearly  screening should continue until the smoker has stopped smoking for at least 15 years. Yearly screening should be stopped for people who develop a health problem that would prevent them from having lung cancer treatment.  If you choose to drink alcohol, do not have more than 2 drinks per day. One drink is considered to be 12 ounces (355 mL) of beer, 5 ounces (148 mL) of wine, or 1.5 ounces (44 mL) of liquor.  Avoid use of street drugs. Do not share needles with anyone. Ask for help if you need  support or instructions about stopping the use of drugs.  High blood pressure causes heart disease and increases the risk of stroke. Your blood pressure should be checked at least every 1-2 years. Ongoing high blood pressure should be treated with medicines, if weight loss and exercise are not effective.  If you are 20-37 years old, ask your health care provider if you should take aspirin to prevent heart disease.  Diabetes screening involves taking a blood sample to check your fasting blood sugar level. This should be done once every 3 years, after age 57, if you are within normal weight and without risk factors for diabetes. Testing should be considered at a younger age or be carried out more frequently if you are overweight and have at least 1 risk factor for diabetes.  Colorectal cancer can be detected and often prevented. Most routine colorectal cancer screening begins at the age of 59 and continues through age 55. However, your health care provider may recommend screening at an earlier age if you have risk factors for colon cancer. On a yearly basis, your health care provider may provide home test kits to check for hidden blood in the stool. Use of a small camera at the end of a tube to directly examine the colon (sigmoidoscopy or colonoscopy) can detect the earliest forms of colorectal cancer. Talk to your health care provider about this at age 68, when routine screening begins. Direct exam of the colon should be repeated every 5-10 years through age 33, unless early forms of precancerous polyps or small growths are found.   Talk with your health care provider about prostate cancer screening.  Testicular cancer screening isrecommended for adult males. Screening includes self-exam, a health care provider exam, and other screening tests. Consult with your health care provider about any symptoms you have or any concerns you have about testicular cancer.  Use sunscreen. Apply sunscreen liberally and  repeatedly throughout the day. You should seek shade when your shadow is shorter than you. Protect yourself by wearing long sleeves, pants, a wide-brimmed hat, and sunglasses year round, whenever you are outdoors.  Once a month, do a whole-body skin exam, using a mirror to look at the skin on your back. Tell your health care provider about new moles, moles that have irregular borders, moles that are larger than a pencil eraser, or moles that have changed in shape or color.  Stay current with required vaccines (immunizations).  Influenza vaccine. All adults should be immunized every year.  Tetanus, diphtheria, and acellular pertussis (Td, Tdap) vaccine. An adult who has not previously received Tdap or who does not know his vaccine status should receive 1 dose of Tdap. This initial dose should be followed by tetanus and diphtheria toxoids (Td) booster doses every 10 years. Adults with an unknown or incomplete history of completing a 3-dose immunization series with Td-containing vaccines should begin or complete a primary immunization series including a Tdap dose. Adults  should receive a Td booster every 10 years.  Varicella vaccine. An adult without evidence of immunity to varicella should receive 2 doses or a second dose if he has previously received 1 dose.  Human papillomavirus (HPV) vaccine. Males aged 4-21 years who have not received the vaccine previously should receive the 3-dose series. Males aged 22-26 years may be immunized. Immunization is recommended through the age of 74 years for any male who has sex with males and did not get any or all doses earlier. Immunization is recommended for any person with an immunocompromised condition through the age of 77 years if he did not get any or all doses earlier. During the 3-dose series, the second dose should be obtained 4-8 weeks after the first dose. The third dose should be obtained 24 weeks after the first dose and 16 weeks after the second  dose.  Zoster vaccine. One dose is recommended for adults aged 80 years or older unless certain conditions are present.    PREVNAR  - Pneumococcal 13-valent conjugate (PCV13) vaccine. When indicated, a person who is uncertain of his immunization history and has no record of immunization should receive the PCV13 vaccine. An adult aged 4 years or older who has certain medical conditions and has not been previously immunized should receive 1 dose of PCV13 vaccine. This PCV13 should be followed with a dose of pneumococcal polysaccharide (PPSV23) vaccine. The PPSV23 vaccine dose should be obtained at least 1 r more year(s) after the dose of PCV13 vaccine. An adult aged 28 years or older who has certain medical conditions and previously received 1 or more doses of PPSV23 vaccine should receive 1 dose of PCV13. The PCV13 vaccine dose should be obtained 1 or more years after the last PPSV23 vaccine dose.    PNEUMOVAX - Pneumococcal polysaccharide (PPSV23) vaccine. When PCV13 is also indicated, PCV13 should be obtained first. All adults aged 70 years and older should be immunized. An adult younger than age 26 years who has certain medical conditions should be immunized. Any person who resides in a nursing home or long-term care facility should be immunized. An adult smoker should be immunized. People with an immunocompromised condition and certain other conditions should receive both PCV13 and PPSV23 vaccines. People with human immunodeficiency virus (HIV) infection should be immunized as soon as possible after diagnosis. Immunization during chemotherapy or radiation therapy should be avoided. Routine use of PPSV23 vaccine is not recommended for American Indians, [place] Natives, or people younger than 65 years unless there are medical conditions that require PPSV23 vaccine. When indicated, people who have unknown immunization and have no record of immunization should receive PPSV23 vaccine. One-time revaccination  5 years after the first dose of PPSV23 is recommended for people aged 19-64 years who have chronic kidney failure, nephrotic syndrome, asplenia, or immunocompromised conditions. People who received 1-2 doses of PPSV23 before age 82 years should receive another dose of PPSV23 vaccine at age 62 years or later if at least 5 years have passed since the previous dose. Doses of PPSV23 are not needed for people immunized with PPSV23 at or after age 97 years.    Hepatitis A vaccine. Adults who wish to be protected from this disease, have certain high-risk conditions, work with hepatitis A-infected animals, work in hepatitis A research labs, or travel to or work in countries with a high rate of hepatitis A should be immunized. Adults who were previously unvaccinated and who anticipate close contact with an international adoptee during the first  60 days after arrival in the [place] from a country with a high rate of hepatitis A should be immunized.    Hepatitis B vaccine. Adults should be immunized if they wish to be protected from this disease, have certain high-risk conditions, may be exposed to blood or other infectious body fluids, are household contacts or sex partners of hepatitis B positive people, are clients or workers in certain care facilities, or travel to or work in countries with a high rate of hepatitis B.   Preventive Service / Frequency   Ages 5 to 80  Blood pressure check.  Lipid and cholesterol check  Lung cancer screening. / Every year if you are aged 32-80 years and have a 30-pack-year history of smoking and currently smoke or have quit within the past 15 years. Yearly screening is stopped once you have quit smoking for at least 15 years or develop a health problem that would prevent you from having lung cancer treatment.  Fecal occult blood test (FOBT) of stool. / Every year beginning at age 54 and continuing until age 62. You may not have to do this test if you get a  colonoscopy every 10 years.  Flexible sigmoidoscopy** or colonoscopy.** / Every 5 years for a flexible sigmoidoscopy or every 10 years for a colonoscopy beginning at age 105 and continuing until age 36. Screening for abdominal aortic aneurysm (AAA)  by ultrasound is recommended for people who have history of high blood pressure or who are current or former smokers. +++++++++++ Recommend Adult Low Dose Aspirin or  coated  Aspirin 81 mg daily  To reduce risk of Colon Cancer 20 %,  Skin Cancer 26 % ,  Malignant Melanoma 46%  and  Pancreatic cancer 60% ++++++++++++++++++++ Vitamin D goal  is between 70-100.  Please make sure that you are taking your Vitamin D as directed.  It is very important as a natural anti-inflammatory  helping hair, skin, and nails, as well as reducing stroke and heart attack risk.  It helps your bones and helps with mood. It also decreases numerous cancer risks so please take it as directed.  Low Vit D is associated with a 200-300% higher risk for CANCER  and 200-300% higher risk for HEART   ATTACK  &  STROKE.   .....................................Marland Kitchen It is also associated with higher death rate at younger ages,  autoimmune diseases like Rheumatoid arthritis, Lupus, Multiple Sclerosis.    Also many other serious conditions, like depression, Alzheimer's Dementia, infertility, muscle aches, fatigue, fibromyalgia - just to name a few. +++++++++++++++++++++ Recommend the book "The END of DIETING" by Dr Excell Seltzer  & the book "The END of DIABETES " by Dr Excell Seltzer At [email or web address] - get book & Audio CD's    Being diabetic has a  300% increased risk for heart attack, stroke, cancer, and alzheimer- type vascular dementia. It is very important that you work harder with diet by avoiding all foods that are white. Avoid white rice (brown & wild rice is OK), white potatoes (sweetpotatoes in moderation is OK), White bread or wheat bread or anything made out of white flour like  bagels, donuts, rolls, buns, biscuits, cakes, pastries, cookies, pizza crust, and pasta (made from white flour & egg whites) - vegetarian pasta or spinach or wheat pasta is OK. Multigrain breads like Arnold's or [employer], or multigrain sandwich thins or flatbreads.  Diet, exercise and weight loss can reverse and cure diabetes in the early stages.  Diet, exercise and  weight loss is very important in the control and prevention of complications of diabetes which affects every system in your body, ie. Brain - dementia/stroke, eyes - glaucoma/blindness, heart - heart attack/heart failure, kidneys - dialysis, stomach - gastric paralysis, intestines - malabsorption, nerves - severe painful neuritis, circulation - gangrene & loss of a leg(s), and finally cancer and Alzheimers.    I recommend avoid fried & greasy foods,  sweets/candy, white rice (brown or wild rice or Quinoa is OK), white potatoes (sweet potatoes are OK) - anything made from white flour - bagels, doughnuts, rolls, buns, biscuits,white and wheat breads, pizza crust and traditional pasta made of white flour & egg white(vegetarian pasta or spinach or wheat pasta is OK).  Multi-grain bread is OK - like multi-grain flat bread or sandwich thins. Avoid alcohol in excess. Exercise is also important.    Eat all the vegetables you want - avoid meat, especially red meat and dairy - especially cheese.  Cheese is the most concentrated form of trans-fats which is the worst thing to clog up our arteries. Veggie cheese is OK which can be found in the fresh produce section at [employer] or [employer] or [employer]  ++++++++++++++++++++++ DASH Eating Plan  DASH stands for "Dietary Approaches to Stop Hypertension."   The DASH eating plan is a healthy eating plan that has been shown to reduce high blood pressure (hypertension). Additional health benefits may include reducing the risk of type 2 diabetes mellitus, heart disease, and stroke. The DASH eating  plan may also help with weight loss. WHAT DO I NEED TO KNOW ABOUT THE DASH EATING PLAN? For the DASH eating plan, you will follow these general guidelines:  Choose foods with a percent daily value for sodium of less than 5% (as listed on the food label).  Use salt-free seasonings or herbs instead of table salt or sea salt.  Check with your health care provider or pharmacist before using salt substitutes.  Eat lower-sodium products, often labeled as "lower sodium" or "no salt added."  Eat fresh foods.  Eat more vegetables, fruits, and low-fat dairy products.  Choose whole grains. Look for the word "whole" as the first word in the ingredient list.  Choose fish   Limit sweets, desserts, sugars, and sugary drinks.  Choose heart-healthy fats.  Eat veggie cheese   Eat more home-cooked food and less restaurant, buffet, and fast food.  Limit fried foods.  Cook foods using methods other than frying.  Limit canned vegetables. If you do use them, rinse them well to decrease the sodium.  When eating at a restaurant, ask that your food be prepared with less salt, or no salt if possible.                      WHAT FOODS CAN I EAT? Read Dr Fara Olden Fuhrman's books on The End of Dieting & The End of Diabetes  Grains Whole grain or whole wheat bread. Brown rice. Whole grain or whole wheat pasta. Quinoa, bulgur, and whole grain cereals. Low-sodium cereals. Corn or whole wheat flour tortillas. Whole grain cornbread. Whole grain crackers. Low-sodium crackers.  Vegetables Fresh or frozen vegetables (raw, steamed, roasted, or grilled). Low-sodium or reduced-sodium tomato and vegetable juices. Low-sodium or reduced-sodium tomato sauce and paste. Low-sodium or reduced-sodium canned vegetables.   Fruits All fresh, canned (in natural juice), or frozen fruits.  Protein Products  All fish and seafood.  Dried beans, peas, or lentils. Unsalted nuts and seeds. Unsalted canned  beans.  Dairy Low-fat  dairy products, such as skim or 1% milk, 2% or reduced-fat cheeses, low-fat ricotta or cottage cheese, or plain low-fat yogurt. Low-sodium or reduced-sodium cheeses.  Fats and Oils Tub margarines without trans fats. Light or reduced-fat mayonnaise and salad dressings (reduced sodium). Avocado. Safflower, olive, or canola oils. Natural peanut or almond butter.  Other Unsalted popcorn and pretzels. The items listed above may not be a complete list of recommended foods or beverages. Contact your dietitian for more options.  +++++++++++++++++++  WHAT FOODS ARE NOT RECOMMENDED? Grains/ White flour or wheat flour White bread. White pasta. White rice. Refined cornbread. Bagels and croissants. Crackers that contain trans fat.  Vegetables  Creamed or fried vegetables. Vegetables in a . Regular canned vegetables. Regular canned tomato sauce and paste. Regular tomato and vegetable juices.  Fruits Dried fruits. Canned fruit in light or heavy syrup. Fruit juice.  Meat and Other Protein Products Meat in general - RED meat & White meat.  Fatty cuts of meat. Ribs, chicken wings, all processed meats as bacon, sausage, bologna, salami, fatback, hot dogs, bratwurst and packaged luncheon meats.  Dairy Whole or 2% milk, cream, half-and-half, and cream cheese. Whole-fat or sweetened yogurt. Full-fat cheeses or blue cheese. Non-dairy creamers and whipped toppings. Processed cheese, cheese spreads, or cheese curds.  Condiments Onion and garlic salt, seasoned salt, table salt, and sea salt. Canned and packaged gravies. Worcestershire sauce. Tartar sauce. Barbecue sauce. Teriyaki sauce. Soy sauce, including reduced sodium. Steak sauce. Fish sauce. Oyster sauce. Cocktail sauce. Horseradish. Ketchup and mustard. Meat flavorings and tenderizers. Bouillon cubes. Hot sauce. Tabasco sauce. Marinades. Taco seasonings. Relishes.  Fats and Oils Butter, stick margarine, lard, shortening and bacon fat. Coconut, palm  kernel, or palm oils. Regular salad dressings.  Pickles and olives. Salted popcorn and pretzels.  The items listed above may not be a complete list of foods and beverages to avoid.

## 2018-07-01 LAB — LIPID PANEL
Cholesterol: 184 mg/dL (ref <200)
HDL: 35 mg/dL — ABNORMAL LOW (ref >40)
LDL Cholesterol (Calc): 115 mg/dL (calc) — ABNORMAL HIGH
Non-HDL Cholesterol (Calc): 149 mg/dL (calc) — ABNORMAL HIGH (ref <130)
Total CHOL/HDL Ratio: 5.3 (calc) — ABNORMAL HIGH (ref <5.0)
Triglycerides: 225 mg/dL — ABNORMAL HIGH (ref <150)

## 2018-07-01 LAB — COMPLETE METABOLIC PANEL WITH GFR
AG Ratio: 1.7 (calc) (ref 1.0–2.5)
ALT: 16 U/L (ref 9–46)
AST: 18 U/L (ref 10–35)
Albumin: 4.4 g/dL (ref 3.6–5.1)
BUN: 15 mg/dL (ref 7–25)
CO2: 28 mmol/L (ref 20–32)
Calcium: 9.7 mg/dL (ref 8.6–10.3)
Creat: 0.98 mg/dL (ref 0.70–1.33)
GFR, Est African American: 101 mL/min/{1.73_m2} (ref >=60)
GFR, Est Non African American: 87 mL/min/{1.73_m2} (ref >=60)
Globulin: 2.6 g/dL (calc) (ref 1.9–3.7)
Glucose, Bld: 86 mg/dL (ref 65–99)
Sodium: 140 mmol/L (ref 135–146)
Total Protein: 7 g/dL (ref 6.1–8.1)

## 2018-07-01 LAB — COMPLETE METABOLIC PANEL WITHOUT GFR
Alkaline phosphatase (APISO): 90 U/L (ref 40–115)
Chloride: 104 mmol/L (ref 98–110)
Potassium: 4.4 mmol/L (ref 3.5–5.3)
Total Bilirubin: 0.4 mg/dL (ref 0.2–1.2)

## 2018-07-01 LAB — CBC WITH DIFFERENTIAL/PLATELET
Absolute Monocytes: 854 cells/uL (ref 200–950)
Basophils Absolute: 58 {cells}/uL (ref 0–200)
Basophils Relative: 0.6 %
Eosinophils Absolute: 495 cells/uL (ref 15–500)
Eosinophils Relative: 5.1 %
HCT: 43.4 % (ref 38.5–50.0)
Hemoglobin: 14.5 g/dL (ref 13.2–17.1)
Lymphs Abs: 2076 cells/uL (ref 850–3900)
MCH: 28.7 pg (ref 27.0–33.0)
MCHC: 33.4 g/dL (ref 32.0–36.0)
MCV: 85.9 fL (ref 80.0–100.0)
MPV: 9.6 fL (ref 7.5–12.5)
Monocytes Relative: 8.8 %
Neutro Abs: 6218 cells/uL (ref 1500–7800)
Neutrophils Relative %: 64.1 %
Platelets: 281 10*3/uL (ref 140–400)
RBC: 5.05 10*6/uL (ref 4.20–5.80)
RDW: 13.4 % (ref 11.0–15.0)
Total Lymphocyte: 21.4 %
WBC: 9.7 10*3/uL (ref 3.8–10.8)

## 2018-07-01 LAB — MAGNESIUM: Magnesium: 1.9 mg/dL (ref 1.5–2.5)

## 2018-07-01 LAB — HEMOGLOBIN A1C
Hgb A1c MFr Bld: 5.6 % of total Hgb (ref <5.7)
Mean Plasma Glucose: 114 (calc)
eAG (mmol/L): 6.3 (calc)

## 2018-07-01 LAB — VITAMIN D 25 HYDROXY (VIT D DEFICIENCY, FRACTURES): Vit D, 25-Hydroxy: 53 ng/mL (ref 30–100)

## 2018-07-01 LAB — INSULIN, RANDOM: Insulin: 3 u[IU]/mL (ref 2.0–19.6)

## 2018-07-01 LAB — URIC ACID: Uric Acid, Serum: 5.6 mg/dL (ref 4.0–8.0)

## 2018-07-01 LAB — TSH: TSH: 2.03 mIU/L (ref 0.40–4.50)

## 2018-07-05 ENCOUNTER — Encounter: Payer: Self-pay | Admitting: Adult Health Nurse Practitioner

## 2018-09-06 ENCOUNTER — Other Ambulatory Visit: Payer: Self-pay | Admitting: Internal Medicine

## 2018-09-06 DIAGNOSIS — M1A9XX0 Chronic gout, unspecified, without tophus (tophi): Secondary | ICD-10-CM

## 2018-09-28 NOTE — Progress Notes (Signed)
FOLLOW UP  Assessment and Plan:    Diagnoses and all orders for this visit:  Labile hypertension - continue medications, DASH diet, exercise and monitor at home. Call if greater than 130/80.   Hyperlipidemia, unspecified hyperlipidemia type Discussed dietary and exercise modifications  Elevated triglycerides and low HDL in past, discussed dietary changes/additions as well as TC supplementation. -     Lipid panel  ESOPHAGEAL STRICTURE Doing well at this time Monitor symptoms  Gastroesophageal reflux disease, esophagitis presence not specified Continue current regiment, diet controlled  Vitamin D deficiency -     VITAMIN D 25 Hydroxy (Vit-D Deficiency) Continue supplementation  Chronic gout without tophus, unspecified cause, unspecified site -Avoid foods high in purines, discussed with aptient -Continue Allopurinal daily -Will check Uric acid today -     colchicine 0.6 MG tablet; Take two tablets (1.2mg ) at onset of gout symptoms.  Then take one tablet one hour later x1.  Insomnia, unspecified type Doing well at this time Not taking any medication for this  Gilbert's syndrome -     COMPLETE METABOLIC PANEL WITH GFR Doing well at this time.  Anxiety Doing well at this time Stress management techniques discussed, increase water, good sleep hygiene discussed, increase exercise, and increase veggies.   Alopecia Doing well with decreasing dose, continue -     finasteride (PROPECIA) 1 MG tablet; Take 1 tablet (1 mg total) by mouth daily.   Class 1 obesity due to excess calories with BMI 34 to 24.9 in adult Long discussion about weight loss, diet, and exercise Recommended diet heavy veggies and low in animal meats, cheeses, and dairy products, appropriate calorie intake Patient will work on diet and increasing exercise/activity. Will follow up in 3 months  Vitamin D Def At goal at last visit; continue supplementation to maintain goal of 70-100 Defer Vit D  level  Prediabetes -     Hemoglobin A1c -     Insulin, random   Medication management -     CBC with Differential/Platelet -     COMPLETE METABOLIC PANEL WITH GFR -     Magnesium -     Lipid panel -     TSH -     Hemoglobin A1c -     Insulin, random -     VITAMIN D 25 Hydroxy (Vit-D Deficiency, Fractures)   Continue diet and meds as discussed. Further disposition pending results of labs. Discussed med's effects and SE's.   Over 30 minutes of exam, counseling, chart review, and critical decision making was performed.   Future Appointments  Date Time Provider Leon  09/29/2018  3:30 PM Garnet Sierras, NP [facility] None  03/18/2019  2:00 PM Vicie Mutters, PA-C [facility] None    ----------------------------------------------------------------------------------------------------------------------  HPI 53 y.o. male  presents for 3 month follow up on hypertension, cholesterol, diabetes, weight and vitamin D deficiency.   Reports that he is taking 1mg  of the finasteride and reports that the previous breast tenderness has resolved.    is taking 2.5mg  of proscar, halt 5mg  tablet.  He reports that he is having some breast tenderness bilaterally, left worse.  Reports it comes and goes.  It is not bothering him today.  Denies any erythema, fevers, open lesions or nipple discharge.  He would like to discuss decreasing this medication.  Reports that he has not had a gout attack since January.  He is taking allopurinal daily. He is monitoring his diet but does not really know triggers for him.  BMI is There is no height or weight on file to calculate BMI., he has not been working on diet and exercise. Wt Readings from Last 3 Encounters:  06/30/18 271 lb 9.6 oz (123.2 kg)  03/12/18 275 lb 6.4 oz (124.9 kg)  03/11/17 262 lb (118.8 kg)    His blood pressure has been controlled and does not check B/P at home, today his    He does not workout. He denies chest pain,  shortness of breath, dizziness.   He is not on cholesterol medication His cholesterol is not at goal. The cholesterol last visit was:   Lab Results  Component Value Date   CHOL 184 06/30/2018   HDL 35 (L) 06/30/2018   LDLCALC 115 (H) 06/30/2018   TRIG 225 (H) 06/30/2018   CHOLHDL 5.3 (H) 06/30/2018    He has not been working on diet and exercise for prediabetes, and denies hyperglycemia, hypoglycemia , nausea, polydipsia, polyuria, visual disturbances and vomiting. Last A1C in the office was:  Lab Results  Component Value Date   HGBA1C 5.6 06/30/2018   Patient is on Vitamin D supplement.   Lab Results  Component Value Date   VD25OH 53 06/30/2018        Current Medications:  Current Outpatient Medications on File Prior to Visit  Medication Sig  . allopurinol (ZYLOPRIM) 300 MG tablet TAKE 1 TABLET BY MOUTH DAILY TO PREVENT A PAINFUL GOUT ATTACK  . cholecalciferol (VITAMIN D) 1000 units tablet Take 10,000 Units by mouth daily.  . colchicine 0.6 MG tablet Take two tablets (1.2mg ) at onset of gout symptoms.  Then take one tablet one hour later x1.  . finasteride (PROPECIA) 1 MG tablet Take 1 tablet (1 mg total) by mouth daily.   No current facility-administered medications on file prior to visit.      Allergies: No Known Allergies   Medical History:  Past Medical History:  Diagnosis Date  . Allergy   . Anemia   . Anxiety   . DDD (degenerative disc disease)   . GERD (gastroesophageal reflux disease)   . Gilbert's syndrome    pt denies dx  . Gout   . Hyperlipidemia   . Hypertension    Family history- Reviewed and unchanged Social history- Reviewed and unchanged   Review of Systems:  ROS    Physical Exam: There were no vitals taken for this visit. Wt Readings from Last 3 Encounters:  06/30/18 271 lb 9.6 oz (123.2 kg)  03/12/18 275 lb 6.4 oz (124.9 kg)  03/11/17 262 lb (118.8 kg)   General Appearance: Well nourished, in no apparent distress. Eyes: PERRLA,  EOMs, conjunctiva no swelling or erythema Sinuses: No Frontal/maxillary tenderness ENT/Mouth: Ext aud canals clear, TMs without erythema, bulging. No erythema, swelling, or exudate on post pharynx.  Tonsils not swollen or erythematous. Hearing normal.  Neck: Supple, thyroid normal.  Respiratory: Respiratory effort normal, BS equal bilaterally without rales, rhonchi, wheezing or stridor.  Cardio: RRR with no MRGs. Brisk peripheral pulses without edema.  Abdomen: Soft, + BS.  Non tender, no guarding, rebound, hernias, masses. Lymphatics: Non tender without lymphadenopathy.  Musculoskeletal: Full ROM, 5/5 strength, Normal gait Skin: Warm, dry without rashes, lesions, ecchymosis.  Neuro: Cranial nerves intact. No cerebellar symptoms.  Psych: Awake and oriented X 3, normal affect, Insight and Judgment appropriate.    Names of Other Physician/Practitioners you currently use: 1. [facility] here for primary care 2. Eye Exam 2019 3. Dentist 2019 Patient Care  Team: Unk Pinto, MD as PCP - General (Internal Medicine) [name], MD as Attending Physician (Urology) Ladene Artist, MD as Consulting Physician (Gastroenterology) Kary Kos, MD as Consulting Physician (Neurosurgery)    Screening Tests: Immunization History  Administered Date(s) Administered  . Influenza-Unspecified 04/12/2018  . Pneumococcal-Unspecified 11/06/1995  . Td 11/01/2004  . Tdap 03/01/2015    Preventative care: Last colonoscopy:2016   Vaccinations: TD or Tdap: 2016  Influenza: 2019  Shingles: Due, discussed with patient   Garnet Sierras, NP 1:59 PM [facility]

## 2018-09-29 ENCOUNTER — Other Ambulatory Visit: Payer: Self-pay

## 2018-09-29 ENCOUNTER — Ambulatory Visit (INDEPENDENT_AMBULATORY_CARE_PROVIDER_SITE_OTHER): Payer: 59 | Admitting: Adult Health Nurse Practitioner

## 2018-09-29 VITALS — BP 124/66 | HR 68 | Temp 97.2°F | Resp 16 | Ht 74.0 in | Wt 271.6 lb

## 2018-09-29 DIAGNOSIS — E559 Vitamin D deficiency, unspecified: Secondary | ICD-10-CM

## 2018-09-29 DIAGNOSIS — E785 Hyperlipidemia, unspecified: Secondary | ICD-10-CM | POA: Diagnosis not present

## 2018-09-29 DIAGNOSIS — R7309 Other abnormal glucose: Secondary | ICD-10-CM | POA: Diagnosis not present

## 2018-09-29 DIAGNOSIS — R0989 Other specified symptoms and signs involving the circulatory and respiratory systems: Secondary | ICD-10-CM

## 2018-09-29 DIAGNOSIS — Z6834 Body mass index (BMI) 34.0-34.9, adult: Secondary | ICD-10-CM

## 2018-09-29 DIAGNOSIS — E6609 Other obesity due to excess calories: Secondary | ICD-10-CM

## 2018-09-29 DIAGNOSIS — G4700 Insomnia, unspecified: Secondary | ICD-10-CM

## 2018-09-29 DIAGNOSIS — E66811 Obesity, class 1: Secondary | ICD-10-CM

## 2018-09-29 DIAGNOSIS — Z79899 Other long term (current) drug therapy: Secondary | ICD-10-CM

## 2018-09-29 DIAGNOSIS — F419 Anxiety disorder, unspecified: Secondary | ICD-10-CM

## 2018-09-29 DIAGNOSIS — M1A9XX0 Chronic gout, unspecified, without tophus (tophi): Secondary | ICD-10-CM

## 2018-09-29 DIAGNOSIS — K222 Esophageal obstruction: Secondary | ICD-10-CM

## 2018-09-29 DIAGNOSIS — K219 Gastro-esophageal reflux disease without esophagitis: Secondary | ICD-10-CM | POA: Diagnosis not present

## 2018-09-29 NOTE — Patient Instructions (Signed)
We will contact you in 1-2 days with your lab results.   - most cholesterol comes from animal sources - fatty meat, egg yolks, dairy fat - so recommend limiting your intake of these. It's also important for your heart health to be conscious of trans fats and saturated fats (in highly processed foods - chips, pastries, etc) and avoid these. Focus diet on whole foods, eating mostly plant-based items - fruit/vegetables, beans, nuts/seeds, avocado, olives/olive oil, and whole grains in moderation supplemented by lean proteins and fish if you choose to consume these.      Preventive Care for Adults  A healthy lifestyle and preventive care can promote health and wellness. Preventive health guidelines for men include the following key practices:  A routine yearly physical is a good way to check with your health care provider about your health and preventative screening. It is a chance to share any concerns and updates on your health and to receive a thorough exam.  Visit your dentist for a routine exam and preventative care every 6 months. Brush your teeth twice a day and floss once a day. Good oral hygiene prevents tooth decay and gum disease.  The frequency of eye exams is based on your age, health, family medical history, use of contact lenses, and other factors. Follow your health care provider's recommendations for frequency of eye exams.  Eat a healthy diet. Foods such as vegetables, fruits, whole grains, low-fat dairy products, and lean protein foods contain the nutrients you need without too many calories. Decrease your intake of foods high in solid fats, added sugars, and salt. Eat the right amount of calories for you. Get information about a proper diet from your health care provider, if necessary.  Regular physical exercise is one of the most important things you can do for your health. Most adults should get at least 150 minutes of moderate-intensity exercise (any activity that increases  your heart rate and causes you to sweat) each week. In addition, most adults need muscle-strengthening exercises on 2 or more days a week.  Maintain a healthy weight. The body mass index (BMI) is a screening tool to identify possible weight problems. It provides an estimate of body fat based on height and weight. Your health care provider can find your BMI and can help you achieve or maintain a healthy weight. For adults 20 years and older:  A BMI below 18.5 is considered underweight.  A BMI of 18.5 to 24.9 is normal.  A BMI of 25 to 29.9 is considered overweight.  A BMI of 30 and above is considered obese.  Maintain normal blood lipids and cholesterol levels by exercising and minimizing your intake of saturated fat. Eat a balanced diet with plenty of fruit and vegetables. Blood tests for lipids and cholesterol should begin at age 31 and be repeated every 5 years. If your lipid or cholesterol levels are high, you are over 50, or you are at high risk for heart disease, you may need your cholesterol levels checked more frequently. Ongoing high lipid and cholesterol levels should be treated with medicines if diet and exercise are not working.  If you smoke, find out from your health care provider how to quit. If you do not use tobacco, do not start.  Lung cancer screening is recommended for adults aged 38-80 years who are at high risk for developing lung cancer because of a history of smoking. A yearly low-dose CT scan of the lungs is recommended for people who have  at least a 30-pack-year history of smoking and are a current smoker or have quit within the past 15 years. A pack year of smoking is smoking an average of 1 pack of cigarettes a day for 1 year (for example: 1 pack a day for 30 years or 2 packs a day for 15 years). Yearly screening should continue until the smoker has stopped smoking for at least 15 years. Yearly screening should be stopped for people who develop a health problem that would  prevent them from having lung cancer treatment.  If you choose to drink alcohol, do not have more than 2 drinks per day. One drink is considered to be 12 ounces (355 mL) of beer, 5 ounces (148 mL) of wine, or 1.5 ounces (44 mL) of liquor.  Avoid use of street drugs. Do not share needles with anyone. Ask for help if you need support or instructions about stopping the use of drugs.  High blood pressure causes heart disease and increases the risk of stroke. Your blood pressure should be checked at least every 1-2 years. Ongoing high blood pressure should be treated with medicines, if weight loss and exercise are not effective.  If you are 46-5 years old, ask your health care provider if you should take aspirin to prevent heart disease.  Diabetes screening involves taking a blood sample to check your fasting blood sugar level. This should be done once every 3 years, after age 63, if you are within normal weight and without risk factors for diabetes. Testing should be considered at a younger age or be carried out more frequently if you are overweight and have at least 1 risk factor for diabetes.  Colorectal cancer can be detected and often prevented. Most routine colorectal cancer screening begins at the age of 21 and continues through age 44. However, your health care provider may recommend screening at an earlier age if you have risk factors for colon cancer. On a yearly basis, your health care provider may provide home test kits to check for hidden blood in the stool. Use of a small camera at the end of a tube to directly examine the colon (sigmoidoscopy or colonoscopy) can detect the earliest forms of colorectal cancer. Talk to your health care provider about this at age 64, when routine screening begins. Direct exam of the colon should be repeated every 5-10 years through age 26, unless early forms of precancerous polyps or small growths are found.   Talk with your health care provider about prostate  cancer screening.  Testicular cancer screening isrecommended for adult males. Screening includes self-exam, a health care provider exam, and other screening tests. Consult with your health care provider about any symptoms you have or any concerns you have about testicular cancer.  Use sunscreen. Apply sunscreen liberally and repeatedly throughout the day. You should seek shade when your shadow is shorter than you. Protect yourself by wearing long sleeves, pants, a wide-brimmed hat, and sunglasses year round, whenever you are outdoors.  Once a month, do a whole-body skin exam, using a mirror to look at the skin on your back. Tell your health care provider about new moles, moles that have irregular borders, moles that are larger than a pencil eraser, or moles that have changed in shape or color.  Stay current with required vaccines (immunizations).  Influenza vaccine. All adults should be immunized every year.  Tetanus, diphtheria, and acellular pertussis (Td, Tdap) vaccine. An adult who has not previously received Tdap or who does  not know his vaccine status should receive 1 dose of Tdap. This initial dose should be followed by tetanus and diphtheria toxoids (Td) booster doses every 10 years. Adults with an unknown or incomplete history of completing a 3-dose immunization series with Td-containing vaccines should begin or complete a primary immunization series including a Tdap dose. Adults should receive a Td booster every 10 years.  Varicella vaccine. An adult without evidence of immunity to varicella should receive 2 doses or a second dose if he has previously received 1 dose.  Human papillomavirus (HPV) vaccine. Males aged 25-21 years who have not received the vaccine previously should receive the 3-dose series. Males aged 22-26 years may be immunized. Immunization is recommended through the age of 27 years for any male who has sex with males and did not get any or all doses earlier. Immunization  is recommended for any person with an immunocompromised condition through the age of 75 years if he did not get any or all doses earlier. During the 3-dose series, the second dose should be obtained 4-8 weeks after the first dose. The third dose should be obtained 24 weeks after the first dose and 16 weeks after the second dose.  Zoster vaccine. One dose is recommended for adults aged 27 years or older unless certain conditions are present.    PREVNAR  - Pneumococcal 13-valent conjugate (PCV13) vaccine. When indicated, a person who is uncertain of his immunization history and has no record of immunization should receive the PCV13 vaccine. An adult aged 40 years or older who has certain medical conditions and has not been previously immunized should receive 1 dose of PCV13 vaccine. This PCV13 should be followed with a dose of pneumococcal polysaccharide (PPSV23) vaccine. The PPSV23 vaccine dose should be obtained at least 1 r more year(s) after the dose of PCV13 vaccine. An adult aged 47 years or older who has certain medical conditions and previously received 1 or more doses of PPSV23 vaccine should receive 1 dose of PCV13. The PCV13 vaccine dose should be obtained 1 or more years after the last PPSV23 vaccine dose.    PNEUMOVAX - Pneumococcal polysaccharide (PPSV23) vaccine. When PCV13 is also indicated, PCV13 should be obtained first. All adults aged 5 years and older should be immunized. An adult younger than age 68 years who has certain medical conditions should be immunized. Any person who resides in a nursing home or long-term care facility should be immunized. An adult smoker should be immunized. People with an immunocompromised condition and certain other conditions should receive both PCV13 and PPSV23 vaccines. People with human immunodeficiency virus (HIV) infection should be immunized as soon as possible after diagnosis. Immunization during chemotherapy or radiation therapy should be avoided.  Routine use of PPSV23 vaccine is not recommended for American Indians, [place] Natives, or people younger than 65 years unless there are medical conditions that require PPSV23 vaccine. When indicated, people who have unknown immunization and have no record of immunization should receive PPSV23 vaccine. One-time revaccination 5 years after the first dose of PPSV23 is recommended for people aged 19-64 years who have chronic kidney failure, nephrotic syndrome, asplenia, or immunocompromised conditions. People who received 1-2 doses of PPSV23 before age 20 years should receive another dose of PPSV23 vaccine at age 62 years or later if at least 5 years have passed since the previous dose. Doses of PPSV23 are not needed for people immunized with PPSV23 at or after age 36 years.    Hepatitis A vaccine. Adults  who wish to be protected from this disease, have certain high-risk conditions, work with hepatitis A-infected animals, work in hepatitis A research labs, or travel to or work in countries with a high rate of hepatitis A should be immunized. Adults who were previously unvaccinated and who anticipate close contact with an international adoptee during the first 60 days after arrival in the [place] from a country with a high rate of hepatitis A should be immunized.    Hepatitis B vaccine. Adults should be immunized if they wish to be protected from this disease, have certain high-risk conditions, may be exposed to blood or other infectious body fluids, are household contacts or sex partners of hepatitis B positive people, are clients or workers in certain care facilities, or travel to or work in countries with a high rate of hepatitis B.   Preventive Service / Frequency   Ages 29 to 17  Blood pressure check.  Lipid and cholesterol check  Lung cancer screening. / Every year if you are aged 51-80 years and have a 30-pack-year history of smoking and currently smoke or have quit within the past 15  years. Yearly screening is stopped once you have quit smoking for at least 15 years or develop a health problem that would prevent you from having lung cancer treatment.  Fecal occult blood test (FOBT) of stool. / Every year beginning at age 36 and continuing until age 47. You may not have to do this test if you get a colonoscopy every 10 years.  Flexible sigmoidoscopy** or colonoscopy.** / Every 5 years for a flexible sigmoidoscopy or every 10 years for a colonoscopy beginning at age 68 and continuing until age 72. Screening for abdominal aortic aneurysm (AAA)  by ultrasound is recommended for people who have history of high blood pressure or who are current or former smokers. +++++++++++ Recommend Adult Low Dose Aspirin or  coated  Aspirin 81 mg daily  To reduce risk of Colon Cancer 20 %,  Skin Cancer 26 % ,  Malignant Melanoma 46%  and  Pancreatic cancer 60% ++++++++++++++++++++ Vitamin D goal  is between 70-100.  Please make sure that you are taking your Vitamin D as directed.  It is very important as a natural anti-inflammatory  helping hair, skin, and nails, as well as reducing stroke and heart attack risk.  It helps your bones and helps with mood. It also decreases numerous cancer risks so please take it as directed.  Low Vit D is associated with a 200-300% higher risk for CANCER  and 200-300% higher risk for HEART   ATTACK  &  STROKE.   .....................................Marland Kitchen It is also associated with higher death rate at younger ages,  autoimmune diseases like Rheumatoid arthritis, Lupus, Multiple Sclerosis.    Also many other serious conditions, like depression, Alzheimer's Dementia, infertility, muscle aches, fatigue, fibromyalgia - just to name a few. +++++++++++++++++++++ Recommend the book "The END of DIETING" by Dr Excell Seltzer  & the book "The END of DIABETES " by Dr Excell Seltzer At [email or web address] - get book & Audio CD's    Being diabetic has a  300% increased risk for  heart attack, stroke, cancer, and alzheimer- type vascular dementia. It is very important that you work harder with diet by avoiding all foods that are white. Avoid white rice (brown & wild rice is OK), white potatoes (sweetpotatoes in moderation is OK), White bread or wheat bread or anything made out of white flour like bagels, donuts, rolls, buns,  biscuits, cakes, pastries, cookies, pizza crust, and pasta (made from white flour & egg whites) - vegetarian pasta or spinach or wheat pasta is OK. Multigrain breads like Arnold's or Pepperidge [employer], or multigrain sandwich thins or flatbreads.  Diet, exercise and weight loss can reverse and cure diabetes in the early stages.  Diet, exercise and weight loss is very important in the control and prevention of complications of diabetes which affects every system in your body, ie. Brain - dementia/stroke, eyes - glaucoma/blindness, heart - heart attack/heart failure, kidneys - dialysis, stomach - gastric paralysis, intestines - malabsorption, nerves - severe painful neuritis, circulation - gangrene & loss of a leg(s), and finally cancer and Alzheimers.    I recommend avoid fried & greasy foods,  sweets/candy, white rice (brown or wild rice or Quinoa is OK), white potatoes (sweet potatoes are OK) - anything made from white flour - bagels, doughnuts, rolls, buns, biscuits,white and wheat breads, pizza crust and traditional pasta made of white flour & egg white(vegetarian pasta or spinach or wheat pasta is OK).  Multi-grain bread is OK - like multi-grain flat bread or sandwich thins. Avoid alcohol in excess. Exercise is also important.    Eat all the vegetables you want - avoid meat, especially red meat and dairy - especially cheese.  Cheese is the most concentrated form of trans-fats which is the worst thing to clog up our arteries. Veggie cheese is OK which can be found in the fresh produce section at [employer] or [employer] or  [employer]  ++++++++++++++++++++++ DASH Eating Plan  DASH stands for "Dietary Approaches to Stop Hypertension."   The DASH eating plan is a healthy eating plan that has been shown to reduce high blood pressure (hypertension). Additional health benefits may include reducing the risk of type 2 diabetes mellitus, heart disease, and stroke. The DASH eating plan may also help with weight loss. WHAT DO I NEED TO KNOW ABOUT THE DASH EATING PLAN? For the DASH eating plan, you will follow these general guidelines:  Choose foods with a percent daily value for sodium of less than 5% (as listed on the food label).  Use salt-free seasonings or herbs instead of table salt or sea salt.  Check with your health care provider or pharmacist before using salt substitutes.  Eat lower-sodium products, often labeled as "lower sodium" or "no salt added."  Eat fresh foods.  Eat more vegetables, fruits, and low-fat dairy products.  Choose whole grains. Look for the word "whole" as the first word in the ingredient list.  Choose fish   Limit sweets, desserts, sugars, and sugary drinks.  Choose heart-healthy fats.  Eat veggie cheese   Eat more home-cooked food and less restaurant, buffet, and fast food.  Limit fried foods.  Cook foods using methods other than frying.  Limit canned vegetables. If you do use them, rinse them well to decrease the sodium.  When eating at a restaurant, ask that your food be prepared with less salt, or no salt if possible.                      WHAT FOODS CAN I EAT? Read Dr Fara Olden Fuhrman's books on The End of Dieting & The End of Diabetes  Grains Whole grain or whole wheat bread. Brown rice. Whole grain or whole wheat pasta. Quinoa, bulgur, and whole grain cereals. Low-sodium cereals. Corn or whole wheat flour tortillas. Whole grain cornbread. Whole grain crackers. Low-sodium crackers.  Vegetables Fresh or frozen  vegetables (raw, steamed, roasted, or grilled). Low-sodium  or reduced-sodium tomato and vegetable juices. Low-sodium or reduced-sodium tomato sauce and paste. Low-sodium or reduced-sodium canned vegetables.   Fruits All fresh, canned (in natural juice), or frozen fruits.  Protein Products  All fish and seafood.  Dried beans, peas, or lentils. Unsalted nuts and seeds. Unsalted canned beans.  Dairy Low-fat dairy products, such as skim or 1% milk, 2% or reduced-fat cheeses, low-fat ricotta or cottage cheese, or plain low-fat yogurt. Low-sodium or reduced-sodium cheeses.  Fats and Oils Tub margarines without trans fats. Light or reduced-fat mayonnaise and salad dressings (reduced sodium). Avocado. Safflower, olive, or canola oils. Natural peanut or almond butter.  Other Unsalted popcorn and pretzels. The items listed above may not be a complete list of recommended foods or beverages. Contact your dietitian for more options.  +++++++++++++++++++  WHAT FOODS ARE NOT RECOMMENDED? Grains/ White flour or wheat flour White bread. White pasta. White rice. Refined cornbread. Bagels and croissants. Crackers that contain trans fat.  Vegetables  Creamed or fried vegetables. Vegetables in a . Regular canned vegetables. Regular canned tomato sauce and paste. Regular tomato and vegetable juices.  Fruits Dried fruits. Canned fruit in light or heavy syrup. Fruit juice.  Meat and Other Protein Products Meat in general - RED meat & White meat.  Fatty cuts of meat. Ribs, chicken wings, all processed meats as bacon, sausage, bologna, salami, fatback, hot dogs, bratwurst and packaged luncheon meats.  Dairy Whole or 2% milk, cream, half-and-half, and cream cheese. Whole-fat or sweetened yogurt. Full-fat cheeses or blue cheese. Non-dairy creamers and whipped toppings. Processed cheese, cheese spreads, or cheese curds.  Condiments Onion and garlic salt, seasoned salt, table salt, and sea salt. Canned and packaged gravies. Worcestershire sauce. Tartar sauce.  Barbecue sauce. Teriyaki sauce. Soy sauce, including reduced sodium. Steak sauce. Fish sauce. Oyster sauce. Cocktail sauce. Horseradish. Ketchup and mustard. Meat flavorings and tenderizers. Bouillon cubes. Hot sauce. Tabasco sauce. Marinades. Taco seasonings. Relishes.  Fats and Oils Butter, stick margarine, lard, shortening and bacon fat. Coconut, palm kernel, or palm oils. Regular salad dressings.  Pickles and olives. Salted popcorn and pretzels.  The items listed above may not be a complete list of foods and beverages to avoid.

## 2018-09-30 LAB — LIPID PANEL
Cholesterol: 173 mg/dL (ref <200)
HDL: 34 mg/dL — ABNORMAL LOW (ref >=40)
LDL Cholesterol (Calc): 102 mg/dL (calc) — ABNORMAL HIGH
Non-HDL Cholesterol (Calc): 139 mg/dL (calc) — ABNORMAL HIGH (ref <130)
Total CHOL/HDL Ratio: 5.1 (calc) — ABNORMAL HIGH (ref <5.0)
Triglycerides: 260 mg/dL — ABNORMAL HIGH (ref <150)

## 2018-09-30 LAB — COMPLETE METABOLIC PANEL WITH GFR
AG Ratio: 1.9 (calc) (ref 1.0–2.5)
ALT: 15 U/L (ref 9–46)
AST: 17 U/L (ref 10–35)
Albumin: 4.2 g/dL (ref 3.6–5.1)
Alkaline phosphatase (APISO): 78 U/L (ref 35–144)
BUN: 13 mg/dL (ref 7–25)
CO2: 29 mmol/L (ref 20–32)
Calcium: 9.5 mg/dL (ref 8.6–10.3)
Chloride: 105 mmol/L (ref 98–110)
Creat: 0.96 mg/dL (ref 0.70–1.33)
GFR, Est African American: 103 mL/min/{1.73_m2} (ref >=60)
Globulin: 2.2 g/dL (calc) (ref 1.9–3.7)
Glucose, Bld: 76 mg/dL (ref 65–99)
Potassium: 4.2 mmol/L (ref 3.5–5.3)
Sodium: 141 mmol/L (ref 135–146)
Total Bilirubin: 0.3 mg/dL (ref 0.2–1.2)
Total Protein: 6.4 g/dL (ref 6.1–8.1)

## 2018-09-30 LAB — CBC WITH DIFFERENTIAL/PLATELET
Absolute Monocytes: 705 cells/uL (ref 200–950)
Basophils Absolute: 52 cells/uL (ref 0–200)
Basophils Relative: 0.6 %
Eosinophils Absolute: 525 cells/uL — ABNORMAL HIGH (ref 15–500)
Eosinophils Relative: 6.1 %
HCT: 40.6 % (ref 38.5–50.0)
Hemoglobin: 13.6 g/dL (ref 13.2–17.1)
Lymphs Abs: 2124 cells/uL (ref 850–3900)
MCH: 28.8 pg (ref 27.0–33.0)
MCHC: 33.5 g/dL (ref 32.0–36.0)
MCV: 85.8 fL (ref 80.0–100.0)
MPV: 9.8 fL (ref 7.5–12.5)
Monocytes Relative: 8.2 %
Neutro Abs: 5194 cells/uL (ref 1500–7800)
Neutrophils Relative %: 60.4 %
Platelets: 273 10*3/uL (ref 140–400)
RBC: 4.73 10*6/uL (ref 4.20–5.80)
RDW: 13.4 % (ref 11.0–15.0)
Total Lymphocyte: 24.7 %
WBC: 8.6 Thousand/uL (ref 3.8–10.8)

## 2018-09-30 LAB — HEMOGLOBIN A1C
Hgb A1c MFr Bld: 5.9 %{Hb} — ABNORMAL HIGH (ref <5.7)
Mean Plasma Glucose: 123 (calc)
eAG (mmol/L): 6.8 (calc)

## 2018-09-30 LAB — COMPLETE METABOLIC PANEL WITHOUT GFR: GFR, Est Non African American: 89 mL/min/1.73m2 (ref >=60)

## 2018-09-30 LAB — VITAMIN D 25 HYDROXY (VIT D DEFICIENCY, FRACTURES): Vit D, 25-Hydroxy: 53 ng/mL (ref 30–100)

## 2018-09-30 LAB — INSULIN, RANDOM: Insulin: 14.8 u[IU]/mL

## 2018-10-04 ENCOUNTER — Encounter: Payer: Self-pay | Admitting: Adult Health Nurse Practitioner

## 2019-01-01 ENCOUNTER — Other Ambulatory Visit: Payer: Self-pay

## 2019-01-01 ENCOUNTER — Encounter: Payer: Self-pay | Admitting: Adult Health Nurse Practitioner

## 2019-01-01 ENCOUNTER — Ambulatory Visit: Payer: 59 | Admitting: Adult Health Nurse Practitioner

## 2019-01-01 VITALS — Ht 73.0 in | Wt 265.0 lb

## 2019-01-01 DIAGNOSIS — R0989 Other specified symptoms and signs involving the circulatory and respiratory systems: Secondary | ICD-10-CM | POA: Diagnosis not present

## 2019-01-01 DIAGNOSIS — K222 Esophageal obstruction: Secondary | ICD-10-CM

## 2019-01-01 DIAGNOSIS — Z6834 Body mass index (BMI) 34.0-34.9, adult: Secondary | ICD-10-CM

## 2019-01-01 DIAGNOSIS — K219 Gastro-esophageal reflux disease without esophagitis: Secondary | ICD-10-CM | POA: Diagnosis not present

## 2019-01-01 DIAGNOSIS — E785 Hyperlipidemia, unspecified: Secondary | ICD-10-CM | POA: Diagnosis not present

## 2019-01-01 DIAGNOSIS — F419 Anxiety disorder, unspecified: Secondary | ICD-10-CM

## 2019-01-01 DIAGNOSIS — G4700 Insomnia, unspecified: Secondary | ICD-10-CM

## 2019-01-01 DIAGNOSIS — M1A9XX0 Chronic gout, unspecified, without tophus (tophi): Secondary | ICD-10-CM

## 2019-01-01 DIAGNOSIS — L659 Nonscarring hair loss, unspecified: Secondary | ICD-10-CM

## 2019-01-01 DIAGNOSIS — E559 Vitamin D deficiency, unspecified: Secondary | ICD-10-CM

## 2019-01-01 DIAGNOSIS — Z79899 Other long term (current) drug therapy: Secondary | ICD-10-CM

## 2019-01-01 NOTE — Patient Instructions (Addendum)
Today you had a video visit with Garnet Sierras, DNP.  Below is a summary of your visit.  We will check labs at your next office visit in September.  You are also due for a one time screening for HIV and Hepatitis C.  These are recommended one time screening by the CDC.  Below is some general information about diet and lowering cholesterol:  - most cholesterol comes from animal sources - fatty meat, egg yolks, dairy fat - so recommend limiting your intake of these. It's also important for your heart health to be conscious of trans fats and saturated fats (in highly processed foods - chips, pastries, etc) and avoid these.   Focus diet on whole foods, eating mostly plant-based items - fruit/vegetables, beans, nuts/seeds, avocado, olives/olive oil, and whole grains in moderation supplemented by lean proteins and fish if you choose to consume these.       When it comes to diets, agreement about the perfect plan isn't easy to find, even among the experts. Experts at the [employer] developed an idea known as the Healthy Eating Plate. Just imagine a plate divided into logical, healthy portions.  The emphasis is on diet quality:  Load up on vegetables and fruits - one-half of your plate: Aim for color and variety, and remember that potatoes don't count.  Go for whole grains - one-quarter of your plate: Whole wheat, barley, wheat berries, quinoa, oats, brown rice, and foods made with them. If you want pasta, go with whole wheat pasta.  Protein power - one-quarter of your plate: Fish, chicken, beans, and nuts are all healthy, versatile protein sources. Limit red meat.  The diet, however, does go beyond the plate, offering a few other suggestions.  Use healthy plant oils, such as olive, canola, soy, corn, sunflower and peanut. Check the labels, and avoid partially hydrogenated oil, which have unhealthy trans fats.  Goal is to drink 80oz of water a day. Coffee and tea are good  in moderation, but skip sugary drinks and limit milk and dairy products to one or two daily servings.  The type of carbohydrate in the diet is more important than the amount. Some sources of carbohydrates, such as vegetables, fruits, whole grains, and beans-are healthier than others.  Finally, stay active.   Diet soda leads to weight gain.  We recently discovered that the artifical sugar in the soda stops an enzyme in your stomach that is suppose to signal that your brain is full. So patients that drink a lot of diet soda will never feel full and tend to over eat. So please cut back on diet soda and it can help with your weight loss.   A study found that two or more sodas a day increased the risk of hypertension, diabetes, stroke.   Another recent study found that higher consumption of soda and juice was associated with increased risk of cancer and breast cancer.   In animal studies artifical sweeteners affect the gut bacteria and may contribute to chronic disease this way.

## 2019-01-01 NOTE — Progress Notes (Signed)
FOLLOW UP  Assessment and Plan:    Diagnoses and all orders for this visit:  Labile hypertension - continue medications, DASH diet, exercise and monitor at home. Call if greater than 130/80.   Hyperlipidemia, unspecified hyperlipidemia type Discussed dietary and exercise modifications  Elevated triglycerides and low HDL in past, discussed dietary changes/additions as well as TC supplementation. -     Lipid panel  ESOPHAGEAL STRICTURE Doing well at this time Monitor symptoms  Gastroesophageal reflux disease, esophagitis presence not specified Continue current regiment, diet controlled  Vitamin D deficiency -     VITAMIN D 25 Hydroxy (Vit-D Deficiency) Continue supplementation  Chronic gout without tophus, unspecified cause, unspecified site -Avoid foods high in purines, discussed with aptient -Continue Allopurinal daily -Will check Uric acid today -     colchicine 0.6 MG tablet; Take two tablets (1.2mg ) at onset of gout symptoms.  Then take one tablet one hour later x1.  Insomnia, unspecified type Doing well at this time Not taking any medication for this  Anxiety Doing well at this time Stress management techniques discussed, increase water, good sleep hygiene discussed, increase exercise, and increase veggies.   Alopecia Doing well with decreasing dose, continue -     finasteride (PROPECIA) 1 MG tablet; Take 1 tablet (1 mg total) by mouth daily.  BMI 34.0 to 34.9 Long discussion about weight loss, diet, and exercise Recommended diet heavy veggies and low in animal meats, cheeses, and dairy products, appropriate calorie intake Patient will work on diet and increasing exercise/activity. Will follow up in 3 months  Vitamin D Def Not at goal at last visit; continue supplementation to maintain goal of 70-100   Prediabetes Discussed dietary and exercise modifications -     Hemoglobin A1c -     Insulin, random   Medication management Continued Will defer labs  this visit Labs at next office visit   Follow Up Instructions:  Discussed assessment and treatment plan with the patient. The patient was provided an opportunity to ask questions and all were answered. The patient agrees with the plan of care and demonstrates an understanding of the instructions.   The patient was advised to call back or seek an in-person evaluation if the symptoms worsen or if the condition fails to improve as anticipated.  I provided 20 minutes of non-face-to-face time during this encounter including interview, counseling, chart review, and critical decision making was preformed.   Future Appointments  Date Time Provider [place]  03/18/2019  2:00 PM Vicie Mutters, PA-C [id number] None     ----------------------------------------------------------------------------------------------------------------------  HPI 53 y.o. male  presents for 3 month follow up on hypertension, cholesterol, prediabetes, gout, weight and vitamin D deficiency. Reports overall he is doing well and no health or medication concerns today. He does not have supplies to check vitals for this visit.  Reports that he is taking 1mg  of the finasteride and reports that the previous breast tenderness has resolved.  Reports that he has not had a gout attack since January, 2020.  He is taking allopurinal daily. He is monitoring his diet but does not really know triggers for him.  He does have colchicine 0.6mg  PRN for flares.   BMI is Body mass index is 34.96 kg/m., he has not been working on diet and exercise. Wt Readings from Last 3 Encounters:  01/01/19 265 lb (120.2 kg)  09/29/18 271 lb 9.6 oz (123.2 kg)  06/30/18 271 lb 9.6 oz (123.2 kg)    His blood pressure has been  controlled and he does not check this at home.  Reports last week he had a dentist appointment and it was in the normal range.   He does not workout regularly and reports a decrease in activity ince COVID19 restrictions.    He denies chest pain, shortness of breath, dizziness.   He is not on cholesterol medication His cholesterol is not at goal. The cholesterol last visit was:   Lab Results  Component Value Date   CHOL 173 09/29/2018   HDL 34 (L) 09/29/2018   LDLCALC 102 (H) 09/29/2018   TRIG 260 (H) 09/29/2018   CHOLHDL 5.1 (H) 09/29/2018    He has not been working on diet and exercise for prediabetes, and denies hyperglycemia, hypoglycemia , nausea, polydipsia, polyuria, visual disturbances and vomiting. Last A1C in the office was:  Lab Results  Component Value Date   HGBA1C 5.9 (H) 09/29/2018   Patient is on Vitamin D supplement.   Lab Results  Component Value Date   VD25OH 53 09/29/2018        Current Medications:  Current Outpatient Medications on File Prior to Visit  Medication Sig  . famotidine (PEPCID) 20 MG tablet Take 20 mg by mouth daily. OTC Med  . allopurinol (ZYLOPRIM) 300 MG tablet TAKE 1 TABLET BY MOUTH DAILY TO PREVENT A PAINFUL GOUT ATTACK  . cholecalciferol (VITAMIN D) 1000 units tablet Take 10,000 Units by mouth daily.  . colchicine 0.6 MG tablet Take two tablets (1.2mg ) at onset of gout symptoms.  Then take one tablet one hour later x1.  . finasteride (PROPECIA) 1 MG tablet Take 1 tablet (1 mg total) by mouth daily.   No current facility-administered medications on file prior to visit.      Allergies: No Known Allergies   Medical History:  Past Medical History:  Diagnosis Date  . Allergy   . Anemia   . Anxiety   . DDD (degenerative disc disease)   . GERD (gastroesophageal reflux disease)   . Gilbert's syndrome    pt denies dx  . Gout   . Hyperlipidemia   . Hypertension    Family history- Reviewed and unchanged Social history- Reviewed and unchanged   Names of Other Physician/Practitioners you currently use: 1. [facility] here for primary care 2. Eye Exam 2019, Due for 2020 3. Dentist 12/2018  Patient Care  Team: Unk Pinto, MD as PCP - General (Internal Medicine) [name], MD as Attending Physician (Urology) Ladene Artist, MD as Consulting Physician (Gastroenterology) Kary Kos, MD as Consulting Physician (Neurosurgery)    Screening Tests: Immunization History  Administered Date(s) Administered  . Influenza-Unspecified 04/12/2018  . Pneumococcal-Unspecified 11/06/1995  . Td 11/01/2004  . Tdap 03/01/2015    Preventative care: Last colonoscopy:2016  Vaccinations: TD or Tdap: 2016  Influenza: 2019  Shingles: Due, discussed with patient   Review of Systems:  Review of Systems  Constitutional: Negative for chills, diaphoresis, fever, malaise/fatigue and weight loss.  HENT: Negative for congestion, ear discharge, ear pain, hearing loss, nosebleeds, sinus pain, sore throat and tinnitus.   Eyes: Negative for blurred vision, double vision, photophobia, pain, discharge and redness.  Respiratory: Negative for cough, hemoptysis, sputum production, shortness of breath, wheezing and stridor.   Cardiovascular: Negative for chest pain, palpitations, orthopnea, claudication, leg swelling and PND.  Gastrointestinal: Negative for abdominal pain, blood in stool, constipation, diarrhea, heartburn, melena, nausea and vomiting.  Genitourinary: Positive for frequency. Negative for dysuria, flank pain, hematuria and urgency.  No change, nocturia remains 1-2 times a night.  Musculoskeletal: Negative for back pain, falls, joint pain, myalgias and neck pain.  Skin: Negative for itching and rash.  Neurological: Negative for dizziness, tingling, tremors, sensory change, speech change, focal weakness, seizures, loss of consciousness, weakness and headaches.  Endo/Heme/Allergies: Negative for environmental allergies and polydipsia. Does not bruise/bleed easily.  Psychiatric/Behavioral: Negative for depression, hallucinations, memory loss, substance abuse and suicidal ideas. The patient is not  nervous/anxious and does not have insomnia.      Physical Exam: Ht 6\' 1"  (1.854 m)   Wt 265 lb (120.2 kg)   BMI 34.96 kg/m  Wt Readings from Last 3 Encounters:  01/01/19 265 lb (120.2 kg)  09/29/18 271 lb 9.6 oz (123.2 kg)  06/30/18 271 lb 9.6 oz (123.2 kg)   General : Well sounding patient in no apparent distress HEENT: no hoarseness, no cough for duration of visit Lungs: speaks in complete sentences, no audible wheezing, no apparent distress Neurological: alert, oriented x 3 Psychiatric: pleasant, judgement appropriate     Garnet Sierras, NP 11:35 AM [facility]

## 2019-03-09 ENCOUNTER — Other Ambulatory Visit: Payer: Self-pay | Admitting: Internal Medicine

## 2019-03-09 DIAGNOSIS — M1A9XX0 Chronic gout, unspecified, without tophus (tophi): Secondary | ICD-10-CM

## 2019-03-15 NOTE — Progress Notes (Signed)
Complete Physical  Assessment and Plan:  Encounter for general adult medical examination with abnormal findings 1 year  Labile hypertension - continue medications, DASH diet, exercise and monitor at home. Call if greater than 130/80.  -     CBC with Differential/Platelet -     BASIC METABOLIC PANEL WITH GFR -     Hepatic function panel -     TSH -     Urinalysis, Routine w reflex microscopic -     Microalbumin / creatinine urine ratio -     Uric acid  Hyperlipidemia, unspecified hyperlipidemia type -continue medications, check lipids, decrease fatty foods, increase activity.  -     Lipid panel  ESOPHAGEAL STRICTURE Continue PPI/H2 blocker, diet discussed  Prediabetes Discussed general issues about diabetes pathophysiology and management., Educational material distributed., Suggested low cholesterol diet., Encouraged aerobic exercise., Discussed foot care., Reminded to get yearly retinal exam. -     Hemoglobin A1c  Vitamin D deficiency -     VITAMIN D 25 Hydroxy (Vit-D Deficiency, Fractures)  Gilbert's syndrome -     Hepatic function panel  Chronic gout without tophus, unspecified cause, unspecified site Gout- recheck Uric acid as needed, Diet discussed, continue medications. -     Uric acid  Insomnia, unspecified type Insomnia- good sleep hygiene discussed, increase day time activity, try melatonin or benadryl if this does not help we will call in sleep medication.   Anxiety-  continue medications, stress management techniques discussed, increase water, good sleep hygiene discussed, increase exercise, and increase veggies.   Gastroesophageal reflux disease, esophagitis presence not specified Continue PPI/H2 blocker, diet discussed May need to do EGD  Medication management -     Magnesium  B12 deficiency -     Vitamin B12    Discussed med's effects and SE's. Screening labs and tests as requested with regular follow-up as recommended.  HPI Patient presents for  a complete physical.   He is working from home.   He has left achilles tendon/left heel pain, will last 3-4 days and will get better, will happen every 1-2 months.   His blood pressure has been controlled at home, today their BP is BP: 128/82 He does not workout. He denies chest pain, shortness of breath, dizziness.  BMI is Body mass index is 35.53 kg/m. Wt Readings from Last 3 Encounters:  03/18/19 273 lb (123.8 kg)  01/01/19 265 lb (120.2 kg)  09/29/18 271 lb 9.6 oz (123.2 kg)   He is on cholesterol medication and denies myalgias. His cholesterol is at goal. The cholesterol last visit was:   Lab Results  Component Value Date   CHOL 173 09/29/2018   HDL 34 (L) 09/29/2018   LDLCALC 102 (H) 09/29/2018   TRIG 260 (H) 09/29/2018   CHOLHDL 5.1 (H) 09/29/2018   He has been working on diet and exercise for prediabetes, he is on bASA, he is not on ACE/ARB and denies foot ulcerations, hyperglycemia, hypoglycemia , increased appetite, nausea, paresthesia of the feet, polydipsia, polyuria, visual disturbances, vomiting and weight loss. Last A1C in the office was:  Lab Results  Component Value Date   HGBA1C 5.9 (H) 09/29/2018   Patient is on Vitamin D supplement, 10,000 IU a day.   Lab Results  Component Value Date   VD25OH 53 09/29/2018   Patient is on allopurinol for gout and does not report a recent flare x 2 years.  Lab Results  Component Value Date   LABURIC 5.6 06/30/2018  Has had some GERD, does take ibuprofen for lower back.    Current Medications:  Current Outpatient Medications on File Prior to Visit  Medication Sig Dispense Refill  . allopurinol (ZYLOPRIM) 300 MG tablet Take 1 tablet Daily to Prevent Gout 90 tablet 1  . cholecalciferol (VITAMIN D) 1000 units tablet Take 10,000 Units by mouth daily.    . colchicine 0.6 MG tablet Take two tablets (1.2mg ) at onset of gout symptoms.  Then take one tablet one hour later x1. 30 tablet 2  . famotidine (PEPCID) 20 MG tablet  Take 20 mg by mouth daily. OTC Med    . finasteride (PROPECIA) 1 MG tablet Take 1 tablet (1 mg total) by mouth daily. 90 tablet 3  . OVER THE COUNTER MEDICATION      No current facility-administered medications on file prior to visit.     Health Maintenance:  Immunization History  Administered Date(s) Administered  . Influenza-Unspecified 04/12/2018  . Pneumococcal-Unspecified 11/06/1995  . Td 11/01/2004  . Tdap 03/01/2015   Tetanus: 2016 Influenza: get in Oct Colonoscopy: 2016 due 5 years Dr. [name] Eye Exam: Once yearly Dentist:  Twice yearly  Patient Care Team: Unk Pinto, MD as PCP - General (Internal Medicine) [name], MD as Attending Physician (Urology) Ladene Artist, MD as Consulting Physician (Gastroenterology) Kary Kos, MD as Consulting Physician (Neurosurgery)  Medical History:  Past Medical History:  Diagnosis Date  . Allergy   . Anemia   . Anxiety   . DDD (degenerative disc disease)   . GERD (gastroesophageal reflux disease)   . Gilbert's syndrome    pt denies dx  . Gout   . Hyperlipidemia   . Hypertension     Allergies No Known Allergies  SURGICAL HISTORY He  has a past surgical history that includes Wisdom tooth extraction (Bilateral); Cholesteatoma excision; Vasectomy; Cholecystectomy; prostrate; Prostate biopsy; and Upper gastrointestinal endoscopy (2011). FAMILY HISTORY His family history includes Diabetes in his father; Hypertension in his father; Lung cancer in his mother. SOCIAL HISTORY He  reports that he has never smoked. He has never used smokeless tobacco. He reports current alcohol use. He reports that he does not use drugs.   Review of Systems:  Review of Systems  Constitutional: Negative for chills, fever and malaise/fatigue.  HENT: Negative for congestion, ear pain and sore throat.   Eyes: Negative.   Respiratory: Negative for cough, shortness of breath and wheezing.   Cardiovascular: Negative for chest pain,  palpitations and leg swelling.  Gastrointestinal: Negative for abdominal pain, blood in stool, constipation, diarrhea, heartburn and melena.  Genitourinary: Negative.   Skin: Negative for itching and rash.  Neurological: Negative for dizziness, sensory change, loss of consciousness and headaches.  Psychiatric/Behavioral: Negative for depression. The patient is not nervous/anxious and does not have insomnia.     Physical Exam: Estimated body mass index is 35.53 kg/m as calculated from the following:   Height as of this encounter: 6' 1.5" (1.867 m).   Weight as of this encounter: 273 lb (123.8 kg). BP 128/82   Pulse 85   Temp 97.7 F (36.5 C)   Ht 6' 1.5" (1.867 m)   Wt 273 lb (123.8 kg)   SpO2 98%   BMI 35.53 kg/m   General Appearance: Well nourished, in no apparent distress.  Eyes: PERRLA, EOMs, conjunctiva no swelling or erythema ENT/Mouth: Ear canals clear bilaterally with no erythema, swelling, discharge.  TMs normal bilaterally with no erythema, bulging, or retractions.  Oropharynx clear and moist  with no exudate, swelling, or erythema. Scalloping of tongue.  Dentition normal.   Neck: Supple, thyroid normal. No bruits, JVD, cervical adenopathy Respiratory: Respiratory effort normal, BS equal bilaterally without rales, rhonchi, wheezing or stridor.  Cardio: RRR without murmurs, rubs or gallops. Brisk peripheral pulses without edema.  Chest: symmetric, with normal excursions Abdomen: Soft, nontender, no guarding, rebound, hernias, masses, or organomegaly. Genitourinary: Deferred to urology Musculoskeletal: Full ROM all peripheral extremities,5/5 strength, and normal gait.  Skin:  Warm, dry without rashes, lesions, ecchymosis. Neuro: A&Ox3, Cranial nerves intact, reflexes equal bilaterally. Normal muscle tone, no cerebellar symptoms. Sensation intact.  Psych: Normal affect, Insight and Judgment appropriate.   EKG: WNL no changes.  Over 40 minutes of exam, counseling, chart  review and critical decision making was performed  Vicie Mutters 2:07 PM [facility]

## 2019-03-18 ENCOUNTER — Other Ambulatory Visit: Payer: Self-pay

## 2019-03-18 ENCOUNTER — Encounter: Payer: Self-pay | Admitting: Physician Assistant

## 2019-03-18 ENCOUNTER — Ambulatory Visit: Payer: 59 | Admitting: Physician Assistant

## 2019-03-18 VITALS — BP 128/82 | HR 85 | Temp 97.7°F | Ht 73.5 in | Wt 273.0 lb

## 2019-03-18 DIAGNOSIS — Z0000 Encounter for general adult medical examination without abnormal findings: Secondary | ICD-10-CM

## 2019-03-18 DIAGNOSIS — R7309 Other abnormal glucose: Secondary | ICD-10-CM

## 2019-03-18 DIAGNOSIS — E559 Vitamin D deficiency, unspecified: Secondary | ICD-10-CM

## 2019-03-18 DIAGNOSIS — K219 Gastro-esophageal reflux disease without esophagitis: Secondary | ICD-10-CM

## 2019-03-18 DIAGNOSIS — K222 Esophageal obstruction: Secondary | ICD-10-CM

## 2019-03-18 DIAGNOSIS — Z125 Encounter for screening for malignant neoplasm of prostate: Secondary | ICD-10-CM

## 2019-03-18 DIAGNOSIS — R0989 Other specified symptoms and signs involving the circulatory and respiratory systems: Secondary | ICD-10-CM | POA: Diagnosis not present

## 2019-03-18 DIAGNOSIS — E785 Hyperlipidemia, unspecified: Secondary | ICD-10-CM

## 2019-03-18 DIAGNOSIS — Z136 Encounter for screening for cardiovascular disorders: Secondary | ICD-10-CM

## 2019-03-18 DIAGNOSIS — Z79899 Other long term (current) drug therapy: Secondary | ICD-10-CM

## 2019-03-18 DIAGNOSIS — G4700 Insomnia, unspecified: Secondary | ICD-10-CM

## 2019-03-18 DIAGNOSIS — F419 Anxiety disorder, unspecified: Secondary | ICD-10-CM

## 2019-03-18 DIAGNOSIS — E538 Deficiency of other specified B group vitamins: Secondary | ICD-10-CM

## 2019-03-18 DIAGNOSIS — M1A9XX0 Chronic gout, unspecified, without tophus (tophi): Secondary | ICD-10-CM

## 2019-03-18 DIAGNOSIS — Z0001 Encounter for general adult medical examination with abnormal findings: Secondary | ICD-10-CM

## 2019-03-18 NOTE — Patient Instructions (Addendum)
Silent reflux: Not all heartburn burns...Marland KitchenMarland KitchenMarland Kitchen  What is LPR? Laryngopharyngeal reflux (LPR) or silent reflux is a condition in which acid that is made in the stomach travels up the esophagus (swallowing tube) and gets to the throat. Not everyone with reflux has a lot of heartburn or indigestion. In fact, many people with LPR never have heartburn. This is why LPR is called SILENT REFLUX, and the terms "Silent reflux" and "LPR" are often used interchangeably. Because LPR is silent, it is sometimes difficult to diagnose.  How can you tell if you have LPR?  Marland Kitchen Chronic hoarseness- Some people have hoarseness that comes and goes . throat clearing  . Cough . It can cause shortness of breath and cause asthma like symptoms. Marland Kitchen a feeling of a lump in the throat  . difficulty swallowing . a problem with too much nose and throat drainage.  . Some people will feel their esophagus spasm which feels like their heart beating hard and fast, this will usually be after a meal, at rest, or lying down at night.    How do I treat this? Treatment for LPR should be individualized, and your doctor will suggest the best treatment for you. Generally there are several treatments for LPR: . changing habits and diet to reduce reflux,  . medications to reduce stomach acid, and  . surgery to prevent reflux. Most people with LPR need to modify how and when they eat, as well as take some medication, to get well. Sometimes, nonprescription liquid antacids, such as Maalox, Gelucil and Mylanta are recommended. When used, these antacids should be taken four times each day - one tablespoon one hour after each meal and before bedtime. Dietary and lifestyle changes alone are not often enough to control LPR - medications that reduce stomach acid are also usually needed. These must be prescribed by our doctor.   TIPS FOR REDUCING REFLUX AND LPR Control your LIFE-STYLE and your DIET! Marland Kitchen If you use tobacco, QUIT.  Marland Kitchen Smoking makes you  reflux. After every cigarette you have some LPR.  . Don't wear clothing that is too tight, especially around the waist (trousers, corsets, belts).  . Do not lie down just after eating...in fact, do not eat within three hours of bedtime.  . You should be on a low-fat diet.  . Limit your intake of red meat.  . Limit your intake of butter.  Marland Kitchen Avoid fried foods.  . Avoid chocolate  . Avoid cheese.  Marland Kitchen Avoid eggs. Marland Kitchen Specifically avoid caffeine (especially coffee and tea), soda pop (especially cola) and mints.  . Avoid alcoholic beverages, particularly in the evening.   Drink 1/2 your body weight in fluid ounces of water daily; drink a tall glass of water 30 min before meals  Don't eat until you're stuffed- listen to your stomach and eat until you are 80% full   Try eating off of a salad plate; wait 10 min after finishing before going back for seconds  Start by eating the vegetables on your plate; aim for 50% of your meals to be fruits or vegetables  Then eat your protein - lean meats (grass fed if possible), fish, beans, nuts in moderation  Eat your carbs/starch last ONLY if you still are hungry. If you can, stop before finishing it all  Avoid sugar and flour - the closer it looks to it's original form in nature, typically the better it is for you  Splurge in moderation - "assign" days when you get to  splurge and have the "bad stuff" - I like to follow a 80% - 20% plan- "good" choices 80 % of the time, "bad" choices in moderation 20% of the time  Simple equation is: Calories out > calories in = weight loss - even if you eat the bad stuff, if you limit portions, you will still lose weight     When it comes to diets, agreement about the perfect plan isn't easy to find, even among the experts. Experts at the [employer] developed an idea known as the Healthy Eating Plate. Just imagine a plate divided into logical, healthy portions.  The emphasis is on diet  quality:  Load up on vegetables and fruits - one-half of your plate: Aim for color and variety, and remember that potatoes don't count.  Go for whole grains - one-quarter of your plate: Whole wheat, barley, wheat berries, quinoa, oats, brown rice, and foods made with them. If you want pasta, go with whole wheat pasta.  Protein power - one-quarter of your plate: Fish, chicken, beans, and nuts are all healthy, versatile protein sources. Limit red meat.  The diet, however, does go beyond the plate, offering a few other suggestions.  Use healthy plant oils, such as olive, canola, soy, corn, sunflower and peanut. Check the labels, and avoid partially hydrogenated oil, which have unhealthy trans fats.  If you're thirsty, drink water. Coffee and tea are good in moderation, but skip sugary drinks and limit milk and dairy products to one or two daily servings.  The type of carbohydrate in the diet is more important than the amount. Some sources of carbohydrates, such as vegetables, fruits, whole grains, and beans-are healthier than others.  Finally, stay active.

## 2019-03-19 LAB — CBC WITH DIFFERENTIAL/PLATELET
Absolute Monocytes: 605 cells/uL (ref 200–950)
Basophils Absolute: 50 cells/uL (ref 0–200)
Basophils Relative: 0.6 %
Eosinophils Absolute: 378 cells/uL (ref 15–500)
Eosinophils Relative: 4.5 %
HCT: 39.3 % (ref 38.5–50.0)
Hemoglobin: 13 g/dL — ABNORMAL LOW (ref 13.2–17.1)
Lymphs Abs: 2243 cells/uL (ref 850–3900)
MCH: 28.7 pg (ref 27.0–33.0)
MCHC: 33.1 g/dL (ref 32.0–36.0)
MCV: 86.8 fL (ref 80.0–100.0)
MPV: 9.9 fL (ref 7.5–12.5)
Monocytes Relative: 7.2 %
Neutro Abs: 5124 cells/uL (ref 1500–7800)
Neutrophils Relative %: 61 %
Platelets: 244 10*3/uL (ref 140–400)
RBC: 4.53 10*6/uL (ref 4.20–5.80)
RDW: 13.8 % (ref 11.0–15.0)
Total Lymphocyte: 26.7 %
WBC: 8.4 10*3/uL (ref 3.8–10.8)

## 2019-03-19 LAB — VITAMIN B12: Vitamin B-12: 326 pg/mL (ref 200–1100)

## 2019-03-19 LAB — MICROALBUMIN / CREATININE URINE RATIO
Creatinine, Urine: 71 mg/dL (ref 20–320)
Microalb Creat Ratio: 4 mcg/mg creat (ref <30)
Microalb, Ur: 0.3 mg/dL

## 2019-03-19 LAB — LIPID PANEL
Cholesterol: 161 mg/dL (ref <200)
HDL: 33 mg/dL — ABNORMAL LOW (ref >=40)
LDL Cholesterol (Calc): 98 mg/dL (calc)
Non-HDL Cholesterol (Calc): 128 mg/dL (calc) (ref <130)
Total CHOL/HDL Ratio: 4.9 (calc) (ref <5.0)
Triglycerides: 210 mg/dL — ABNORMAL HIGH (ref <150)

## 2019-03-19 LAB — COMPLETE METABOLIC PANEL WITH GFR
AG Ratio: 2 (calc) (ref 1.0–2.5)
ALT: 18 U/L (ref 9–46)
AST: 19 U/L (ref 10–35)
Albumin: 4.1 g/dL (ref 3.6–5.1)
Alkaline phosphatase (APISO): 81 U/L (ref 35–144)
BUN: 12 mg/dL (ref 7–25)
CO2: 27 mmol/L (ref 20–32)
Calcium: 9 mg/dL (ref 8.6–10.3)
Chloride: 107 mmol/L (ref 98–110)
Creat: 1.03 mg/dL (ref 0.70–1.33)
GFR, Est African American: 94 mL/min/{1.73_m2} (ref >=60)
GFR, Est Non African American: 81 mL/min/{1.73_m2} (ref >=60)
Globulin: 2.1 g/dL (calc) (ref 1.9–3.7)
Glucose, Bld: 93 mg/dL (ref 65–99)
Potassium: 4.3 mmol/L (ref 3.5–5.3)
Sodium: 141 mmol/L (ref 135–146)
Total Bilirubin: 0.4 mg/dL (ref 0.2–1.2)
Total Protein: 6.2 g/dL (ref 6.1–8.1)

## 2019-03-19 LAB — VITAMIN D 25 HYDROXY (VIT D DEFICIENCY, FRACTURES): Vit D, 25-Hydroxy: 69 ng/mL (ref 30–100)

## 2019-03-19 LAB — URINALYSIS, ROUTINE W REFLEX MICROSCOPIC
Bilirubin Urine: NEGATIVE
Glucose, UA: NEGATIVE
Hgb urine dipstick: NEGATIVE
Ketones, ur: NEGATIVE
Leukocytes,Ua: NEGATIVE
Nitrite: NEGATIVE
Protein, ur: NEGATIVE
Specific Gravity, Urine: 1.011 (ref 1.001–1.03)
pH: 6 (ref 5.0–8.0)

## 2019-03-19 LAB — URIC ACID: Uric Acid, Serum: 5.6 mg/dL (ref 4.0–8.0)

## 2019-03-19 LAB — HEMOGLOBIN A1C
Hgb A1c MFr Bld: 5.7 % of total Hgb — ABNORMAL HIGH (ref <5.7)
Mean Plasma Glucose: 117 (calc)
eAG (mmol/L): 6.5 (calc)

## 2019-03-19 LAB — MAGNESIUM: Magnesium: 1.9 mg/dL (ref 1.5–2.5)

## 2019-03-19 LAB — TSH: TSH: 2.01 mIU/L (ref 0.40–4.50)

## 2019-08-09 ENCOUNTER — Ambulatory Visit: Payer: 59 | Admitting: Adult Health

## 2019-09-10 ENCOUNTER — Other Ambulatory Visit: Payer: Self-pay | Admitting: Internal Medicine

## 2019-09-10 DIAGNOSIS — M1A9XX0 Chronic gout, unspecified, without tophus (tophi): Secondary | ICD-10-CM

## 2019-12-04 ENCOUNTER — Other Ambulatory Visit: Payer: Self-pay | Admitting: Internal Medicine

## 2019-12-04 DIAGNOSIS — M1A9XX0 Chronic gout, unspecified, without tophus (tophi): Secondary | ICD-10-CM

## 2020-03-21 ENCOUNTER — Encounter: Payer: 59 | Admitting: Physician Assistant

## 2020-05-17 ENCOUNTER — Encounter: Payer: Self-pay | Admitting: Adult Health Nurse Practitioner

## 2020-05-17 ENCOUNTER — Other Ambulatory Visit: Payer: Self-pay

## 2020-05-17 ENCOUNTER — Ambulatory Visit: Payer: 59 | Admitting: Adult Health Nurse Practitioner

## 2020-05-17 VITALS — BP 120/84 | HR 64 | Temp 97.5°F | Ht 74.0 in | Wt 286.0 lb

## 2020-05-17 DIAGNOSIS — K222 Esophageal obstruction: Secondary | ICD-10-CM

## 2020-05-17 DIAGNOSIS — E538 Deficiency of other specified B group vitamins: Secondary | ICD-10-CM

## 2020-05-17 DIAGNOSIS — Z125 Encounter for screening for malignant neoplasm of prostate: Secondary | ICD-10-CM

## 2020-05-17 DIAGNOSIS — R7309 Other abnormal glucose: Secondary | ICD-10-CM

## 2020-05-17 DIAGNOSIS — M1A9XX0 Chronic gout, unspecified, without tophus (tophi): Secondary | ICD-10-CM

## 2020-05-17 DIAGNOSIS — Z79899 Other long term (current) drug therapy: Secondary | ICD-10-CM

## 2020-05-17 DIAGNOSIS — F419 Anxiety disorder, unspecified: Secondary | ICD-10-CM

## 2020-05-17 DIAGNOSIS — R0989 Other specified symptoms and signs involving the circulatory and respiratory systems: Secondary | ICD-10-CM

## 2020-05-17 DIAGNOSIS — K219 Gastro-esophageal reflux disease without esophagitis: Secondary | ICD-10-CM

## 2020-05-17 DIAGNOSIS — L659 Nonscarring hair loss, unspecified: Secondary | ICD-10-CM

## 2020-05-17 DIAGNOSIS — Z0001 Encounter for general adult medical examination with abnormal findings: Secondary | ICD-10-CM

## 2020-05-17 DIAGNOSIS — N528 Other male erectile dysfunction: Secondary | ICD-10-CM

## 2020-05-17 DIAGNOSIS — G4700 Insomnia, unspecified: Secondary | ICD-10-CM

## 2020-05-17 DIAGNOSIS — Z1389 Encounter for screening for other disorder: Secondary | ICD-10-CM

## 2020-05-17 DIAGNOSIS — Z6834 Body mass index (BMI) 34.0-34.9, adult: Secondary | ICD-10-CM

## 2020-05-17 DIAGNOSIS — E785 Hyperlipidemia, unspecified: Secondary | ICD-10-CM

## 2020-05-17 DIAGNOSIS — Z1329 Encounter for screening for other suspected endocrine disorder: Secondary | ICD-10-CM

## 2020-05-17 DIAGNOSIS — Z130 Encounter for screening for diseases of the blood and blood-forming organs and certain disorders involving the immune mechanism: Secondary | ICD-10-CM

## 2020-05-17 DIAGNOSIS — E559 Vitamin D deficiency, unspecified: Secondary | ICD-10-CM

## 2020-05-17 DIAGNOSIS — Z0000 Encounter for general adult medical examination without abnormal findings: Secondary | ICD-10-CM

## 2020-05-17 NOTE — Patient Instructions (Signed)
[facility]  Contact Dr Lynne Leader office to schedule colonoscopy and evaluation endoscopy for esophagus, swallowing.  Please let us know if they require a referral for this.   Try changing your antihistamine, change from cetirizine to xyzal OR ADD fexofenadine (Allegra)   Allergy Symptoms / Runny Nose: Chose one  Claritin - Best for itchy/watery yes   Zyrtec / Cetirizine  Better to dry up fluid Take 10mg  by mouth May cause drowsiness, take nightly Be sure to drink plenty of water If this is not effective, try Xyzal or Allegra  OR  Xyzal / Levocetirazine  Take 5mg  by mouth May cause drowsiness, take nightly Be sure to drink plenty of water If this is not effective try Allegra or Zyrtec    Allegra / fexofenadine  ADD Take 180mg  by mouth daily If this is not effective try Zyrtec or Xyzal   *If you battle with chronic allergies you may need to change the antihistamine you currently use to find most effective.     GENERAL HEALTH GOALS  Know what a healthy weight is for you (roughly BMI <25) and aim to maintain this  Aim for 7+ servings of fruits and vegetables daily  70-80+ fluid ounces of water or unsweet tea for healthy kidneys  Limit to max 1 drink of alcohol per day; avoid smoking/tobacco  Limit animal fats in diet for cholesterol and heart health - choose grass fed whenever available  Avoid highly processed foods, and foods high in saturated/trans fats  Aim for low stress - take time to unwind and care for your mental health  Aim for 150 min of moderate intensity exercise weekly for heart health, and weights twice weekly for bone health  Aim for 7-9 hours of sleep daily

## 2020-05-17 NOTE — Progress Notes (Signed)
Complete Physical  Assessment and Plan:  Encounter for general adult medical examination with abnormal findings 1 year  Labile hypertension - continue medications, DASH diet, exercise and monitor at home. Call if greater than 130/80.  -     CBC with Differential/Platelet -     BASIC METABOLIC PANEL WITH GFR -     Hepatic function panel -     TSH -     Urinalysis, Routine w reflex microscopic -     Microalbumin / creatinine urine ratio -     Uric acid  Hyperlipidemia, unspecified hyperlipidemia type -continue medications, check lipids, decrease fatty foods, increase activity.  -     Lipid panel  ESOPHAGEAL STRICTURE Continue PPI/H2 blocker, diet discussed  Prediabetes Discussed general issues about diabetes pathophysiology and management., Educational material distributed., Suggested low cholesterol diet., Encouraged aerobic exercise., Discussed foot care., Reminded to get yearly retinal exam. -     Hemoglobin A1c  Vitamin D deficiency -     VITAMIN D 25 Hydroxy (Vit-D Deficiency, Fractures)  Gilbert's syndrome -     Hepatic function panel  Chronic gout without tophus, unspecified cause, unspecified site Gout- recheck Uric acid as needed, Diet discussed, continue medications. -     Uric acid  Insomnia, unspecified type Insomnia- good sleep hygiene discussed, increase day time activity, try melatonin or benadryl if this does not help we will call in sleep medication.   Anxiety-  continue medications, stress management techniques discussed, increase water, good sleep hygiene discussed, increase exercise, and increase veggies.   Gastroesophageal reflux disease, esophagitis presence not specified Continue PPI/H2 blocker, diet discussed May need to do EGD  Medication management -     Magnesium  B12 deficiency -     Vitamin B12  Alopecia Taking finasteride Want to continue taking despite sexual side effects.  ED Reports connected to finasteride, does not wish to  D/C medication Discussed Cialis, Viagra.  Not sexually active at this time    Discussed med's effects and SE's. Screening labs and tests as requested with regular follow-up as recommended.  HPI Patient presents for a complete physical.   He is working from home.   He is taking Cetirizine (Zyrtec) every morning.  Reports that he is having some itching in his ears over the course of the last three to four week.  It is bilateral.  Denies any popping cracking, change in hearing or dizziness.  Reports he is starting to have an increase in difficultly swallowing with the first few bites of his food.  Reports he is not having any difficulties with liquids.   His blood pressure has been controlled at home, today their BP is   He does not workout. He denies chest pain, shortness of breath, dizziness. He was seen in 2010, [place] by Dr [name]. Will place referral for evaluation.  BMI is There is no height or weight on file to calculate BMI. Wt Readings from Last 3 Encounters:  03/18/19 273 lb (123.8 kg)  01/01/19 265 lb (120.2 kg)  09/29/18 271 lb 9.6 oz (123.2 kg)   He is on cholesterol medication and denies myalgias. His cholesterol is at goal. The cholesterol last visit was:   Lab Results  Component Value Date   CHOL 161 03/18/2019   HDL 33 (L) 03/18/2019   LDLCALC 98 03/18/2019   TRIG 210 (H) 03/18/2019   CHOLHDL 4.9 03/18/2019   He has been working on diet and exercise for prediabetes, he is on bASA,  he is not on ACE/ARB and denies foot ulcerations, hyperglycemia, hypoglycemia , increased appetite, nausea, paresthesia of the feet, polydipsia, polyuria, visual disturbances, vomiting and weight loss. Last A1C in the office was:  Lab Results  Component Value Date   HGBA1C 5.7 (H) 03/18/2019   Patient is on Vitamin D supplement, 5,000 IU a day.   Lab Results  Component Value Date   VD25OH 69 03/18/2019   Patient is on allopurinol for gout and does not report a  recent flare x 2 years.  Lab Results  Component Value Date   LABURIC 5.6 03/18/2019     Has had some GERD, does take ibuprofen for lower back.   Reports ED symptoms since the start of using finasteride.  He started for hair loss.  Reports he is not able to maintain the erection or ejaculation.    BPH Currently he is symptomatic and endorses nocturia x 1-2 a night and denies urinary hesitancy, urinary frequency, incomplete voiding, double voiding, weak stream, perineal discomfort, dysuria, hematuria, abdominal pain, flank pain and testicular pain.  He is taking Finasteride (Propecia). He is following with urology at this time and last results were in normal. Lab Results  Component Value Date   PSA 2.5 03/11/2017      Current Medications:  Current Outpatient Medications on File Prior to Visit  Medication Sig Dispense Refill  . allopurinol (ZYLOPRIM) 300 MG tablet TAKE 1 TABLET DAILY TO PREVENT GOUT 90 tablet 1  . cholecalciferol (VITAMIN D) 1000 units tablet Take 10,000 Units by mouth daily.    . colchicine 0.6 MG tablet Take two tablets (1.2mg ) at onset of gout symptoms.  Then take one tablet one hour later x1. 30 tablet 2  . famotidine (PEPCID) 20 MG tablet Take 20 mg by mouth daily. OTC Med    . OVER THE COUNTER MEDICATION      No current facility-administered medications on file prior to visit.    Health Maintenance:  Immunization History  Administered Date(s) Administered  . Influenza Inj Mdck Quad Pf 04/12/2018  . Influenza,inj,Quad PF,6+ Mos 04/05/2019  . Influenza-Unspecified 04/12/2018  . Pneumococcal-Unspecified 11/06/1995  . Td 11/01/2004  . Tdap 03/01/2015   Tetanus: 2016 Influenza: get in Oct Colonoscopy: 2016 due 5 years Dr. [name] , DUE, Pt to call, will place referral if needed. Eye Exam: Due 2021 Dentist:  04/2020, Q52months  Patient Care Team: Unk Pinto, MD as PCP - General (Internal Medicine) [name], MD as Attending Physician  (Urology) Ladene Artist, MD as Consulting Physician (Gastroenterology) Kary Kos, MD as Consulting Physician (Neurosurgery)  Medical History:  Past Medical History:  Diagnosis Date  . Allergy   . Anemia   . Anxiety   . DDD (degenerative disc disease)   . GERD (gastroesophageal reflux disease)   . Gilbert's syndrome    pt denies dx  . Gout   . Hyperlipidemia   . Hypertension     Allergies No Known Allergies  SURGICAL HISTORY He  has a past surgical history that includes Wisdom tooth extraction (Bilateral); Cholesteatoma excision; Vasectomy; Cholecystectomy; prostrate; Prostate biopsy; and Upper gastrointestinal endoscopy (2011). FAMILY HISTORY His family history includes Diabetes in his father; Hypertension in his father; Lung cancer in his mother. SOCIAL HISTORY He  reports that he has never smoked. He has never used smokeless tobacco. He reports current alcohol use. He reports that he does not use drugs.   Review of Systems:  Review of Systems  Constitutional: Negative for chills, diaphoresis, fever,  malaise/fatigue and weight loss.  HENT: Negative for congestion, ear discharge, ear pain, hearing loss, nosebleeds, sinus pain, sore throat and tinnitus.   Eyes: Negative.  Negative for blurred vision, double vision, photophobia, pain, discharge and redness.  Respiratory: Negative for cough, hemoptysis, sputum production, shortness of breath, wheezing and stridor.   Cardiovascular: Negative for chest pain, palpitations, orthopnea, claudication, leg swelling and PND.  Gastrointestinal: Negative for abdominal pain, blood in stool, constipation, diarrhea, heartburn, melena, nausea and vomiting.  Genitourinary: Negative.  Negative for dysuria, flank pain, frequency, hematuria and urgency.       ED, maintaining  Musculoskeletal: Positive for back pain. Negative for falls, joint pain, myalgias and neck pain.       Lumbar chronic  Skin: Negative for itching and rash.        allopecia  Neurological: Negative for dizziness, tingling, tremors, sensory change, speech change, focal weakness, seizures, loss of consciousness, weakness and headaches.  Endo/Heme/Allergies: Negative for environmental allergies and polydipsia. Does not bruise/bleed easily.  Psychiatric/Behavioral: Negative for depression, hallucinations, memory loss, substance abuse and suicidal ideas. The patient is not nervous/anxious and does not have insomnia.     Physical Exam: Estimated body mass index is 35.53 kg/m as calculated from the following:   Height as of 03/18/19: 6' 1.5" (1.867 m).   Weight as of 03/18/19: 273 lb (123.8 kg). There were no vitals taken for this visit.  General Appearance: Well nourished, in no apparent distress.  Eyes: PERRLA, EOMs, conjunctiva no swelling or erythema ENT/Mouth: Ear canals clear bilaterally with no erythema, swelling, discharge.  TMs normal bilaterally with no erythema, bulging, or retractions.  Oropharynx clear and moist with no exudate, swelling, or erythema. Scalloping of tongue.  Dentition normal.   Neck: Supple, thyroid normal. No bruits, JVD, cervical adenopathy Respiratory: Respiratory effort normal, BS equal bilaterally without rales, rhonchi, wheezing or stridor.  Cardio: RRR without murmurs, rubs or gallops. Brisk peripheral pulses without edema.  Chest: symmetric, with normal excursions Abdomen: Soft, nontender, no guarding, rebound, hernias, masses, or organomegaly. Genitourinary: Deferred to urology Musculoskeletal: Full ROM all peripheral extremities,5/5 strength, and normal gait.  Skin:  Warm, dry without rashes, lesions, ecchymosis. Neuro: A&Ox3, Cranial nerves intact, reflexes equal bilaterally. Normal muscle tone, no cerebellar symptoms. Sensation intact.  Psych: Normal affect, Insight and Judgment appropriate.   EKG: WNL no changes.   Garnet Sierras 8:41 AM [facility]

## 2020-05-18 ENCOUNTER — Other Ambulatory Visit: Payer: Self-pay | Admitting: Adult Health Nurse Practitioner

## 2020-05-18 LAB — LIPID PANEL
Cholesterol: 182 mg/dL (ref <200)
HDL: 34 mg/dL — ABNORMAL LOW (ref >=40)
LDL Cholesterol (Calc): 112 mg/dL (calc) — ABNORMAL HIGH
Non-HDL Cholesterol (Calc): 148 mg/dL (calc) — ABNORMAL HIGH (ref <130)
Total CHOL/HDL Ratio: 5.4 (calc) — ABNORMAL HIGH (ref <5.0)
Triglycerides: 237 mg/dL — ABNORMAL HIGH (ref <150)

## 2020-05-18 LAB — URINALYSIS W MICROSCOPIC + REFLEX CULTURE
Bacteria, UA: NONE SEEN /HPF
Bilirubin Urine: NEGATIVE
Glucose, UA: NEGATIVE
Hgb urine dipstick: NEGATIVE
Hyaline Cast: NONE SEEN /LPF
Ketones, ur: NEGATIVE
Leukocyte Esterase: NEGATIVE
Nitrites, Initial: NEGATIVE
Protein, ur: NEGATIVE
RBC / HPF: NONE SEEN /HPF (ref 0–2)
Specific Gravity, Urine: 1.016 (ref 1.001–1.03)
Squamous Epithelial / HPF: NONE SEEN /HPF (ref <=5)
WBC, UA: NONE SEEN /HPF (ref 0–5)
pH: 5.5 (ref 5.0–8.0)

## 2020-05-18 LAB — MICROALBUMIN / CREATININE URINE RATIO
Creatinine, Urine: 154 mg/dL (ref 20–320)
Microalb Creat Ratio: 3 mcg/mg creat (ref <30)
Microalb, Ur: 0.5 mg/dL

## 2020-05-18 LAB — CBC WITH DIFFERENTIAL/PLATELET
Absolute Monocytes: 611 cells/uL (ref 200–950)
Basophils Absolute: 52 cells/uL (ref 0–200)
Basophils Relative: 0.6 %
Eosinophils Absolute: 490 cells/uL (ref 15–500)
Eosinophils Relative: 5.7 %
HCT: 42.1 % (ref 38.5–50.0)
Hemoglobin: 14.5 g/dL (ref 13.2–17.1)
Lymphs Abs: 2021 cells/uL (ref 850–3900)
MCH: 29.8 pg (ref 27.0–33.0)
MCHC: 34.4 g/dL (ref 32.0–36.0)
MCV: 86.6 fL (ref 80.0–100.0)
MPV: 9.7 fL (ref 7.5–12.5)
Monocytes Relative: 7.1 %
Neutro Abs: 5427 cells/uL (ref 1500–7800)
Neutrophils Relative %: 63.1 %
Platelets: 260 10*3/uL (ref 140–400)
RBC: 4.86 10*6/uL (ref 4.20–5.80)
RDW: 13.3 % (ref 11.0–15.0)
Total Lymphocyte: 23.5 %
WBC: 8.6 10*3/uL (ref 3.8–10.8)

## 2020-05-18 LAB — COMPLETE METABOLIC PANEL WITH GFR
AG Ratio: 2 (calc) (ref 1.0–2.5)
ALT: 21 U/L (ref 9–46)
AST: 18 U/L (ref 10–35)
Albumin: 4.3 g/dL (ref 3.6–5.1)
Alkaline phosphatase (APISO): 85 U/L (ref 35–144)
BUN: 14 mg/dL (ref 7–25)
CO2: 27 mmol/L (ref 20–32)
Calcium: 9.6 mg/dL (ref 8.6–10.3)
Chloride: 102 mmol/L (ref 98–110)
Creat: 0.95 mg/dL (ref 0.70–1.33)
GFR, Est African American: 103 mL/min/{1.73_m2} (ref >=60)
GFR, Est Non African American: 89 mL/min/{1.73_m2} (ref >=60)
Globulin: 2.1 g/dL (calc) (ref 1.9–3.7)
Glucose, Bld: 112 mg/dL — ABNORMAL HIGH (ref 65–99)
Potassium: 4.2 mmol/L (ref 3.5–5.3)
Sodium: 140 mmol/L (ref 135–146)
Total Bilirubin: 0.4 mg/dL (ref 0.2–1.2)
Total Protein: 6.4 g/dL (ref 6.1–8.1)

## 2020-05-18 LAB — IRON, TOTAL/TOTAL IRON BINDING CAP
%SAT: 27 % (calc) (ref 20–48)
Iron: 102 ug/dL (ref 50–180)
TIBC: 376 mcg/dL (calc) (ref 250–425)

## 2020-05-18 LAB — PSA: PSA: 3.97 ng/mL (ref <=4.0)

## 2020-05-18 LAB — HEMOGLOBIN A1C
Hgb A1c MFr Bld: 6.3 % of total Hgb — ABNORMAL HIGH (ref <5.7)
Mean Plasma Glucose: 134 (calc)
eAG (mmol/L): 7.4 (calc)

## 2020-05-18 LAB — VITAMIN B12: Vitamin B-12: 1444 pg/mL — ABNORMAL HIGH (ref 200–1100)

## 2020-05-18 LAB — MAGNESIUM: Magnesium: 2 mg/dL (ref 1.5–2.5)

## 2020-05-18 LAB — NO CULTURE INDICATED

## 2020-05-18 LAB — TSH: TSH: 3.17 mIU/L (ref 0.40–4.50)

## 2020-06-28 ENCOUNTER — Other Ambulatory Visit: Payer: Self-pay | Admitting: Adult Health Nurse Practitioner

## 2020-06-28 ENCOUNTER — Encounter: Payer: Self-pay | Admitting: Adult Health Nurse Practitioner

## 2020-06-28 DIAGNOSIS — Z6834 Body mass index (BMI) 34.0-34.9, adult: Secondary | ICD-10-CM

## 2020-06-28 MED ORDER — PHENTERMINE HCL 15 MG PO CAPS: 15.0000 mg | ORAL_CAPSULE | ORAL | 0 refills | Status: DC

## 2020-06-28 NOTE — Progress Notes (Signed)
Will start patient on 15mg  Phentermine.  Follow up in one month for weight management.  Garnet Sierras, Laqueta Jean, DNP [facility] 06/28/2020  8:20 AM

## 2020-07-31 ENCOUNTER — Ambulatory Visit: Payer: 59 | Admitting: Adult Health Nurse Practitioner

## 2020-08-16 ENCOUNTER — Encounter: Payer: Self-pay | Admitting: Gastroenterology

## 2020-08-16 ENCOUNTER — Ambulatory Visit: Payer: 59 | Admitting: Gastroenterology

## 2020-08-16 VITALS — BP 110/76 | HR 64 | Ht 74.0 in | Wt 273.0 lb

## 2020-08-16 DIAGNOSIS — R1310 Dysphagia, unspecified: Secondary | ICD-10-CM

## 2020-08-16 DIAGNOSIS — Z86010 Personal history of colonic polyps: Secondary | ICD-10-CM

## 2020-08-16 MED ORDER — NA SULFATE-K SULFATE-MG SULF 17.5-3.13-1.6 GM/177ML PO SOLN: 1.0000 | Freq: Once | ORAL | 0 refills | Status: AC

## 2020-08-16 NOTE — Patient Instructions (Signed)
You have been scheduled for an endoscopy and colonoscopy. Please follow the written instructions given to you at your visit today. Please pick up your prep supplies at the pharmacy within the next 1-3 days. If you use inhalers (even only as needed), please bring them with you on the day of your procedure.  Thank you for choosing me and [facility].  Malcolm T. Stark, Jr., MD., FACG   

## 2020-08-16 NOTE — Progress Notes (Signed)
.00   History of Present Illness: This is a 55 year old male referred by Unk Pinto, MD for the evaluation of dysphagia, GERD and a personal history of adenomatous colon polyps. He has a history of GERD with an esophageal stricture. He underwent EGD with dilation in August 2010. He notes over the past 3 to 4 months worsening problems with solid food dysphagia primarily with meats and breads. He has had to induce vomiting on a few occasions. He takes famotidine 20 mg daily which controls his reflux symptoms. If he misses doses he notes a return in symptoms. States he had mild constipation last week and this is not typical for him. His bowels have now returned to normal. Denies weight loss, abdominal pain, diarrhea, change in stool caliber, melena, hematochezia, nausea, vomiting, chest pain.   No Known Allergies Outpatient Medications Prior to Visit  Medication Sig Dispense Refill  . allopurinol (ZYLOPRIM) 300 MG tablet TAKE 1 TABLET DAILY TO PREVENT GOUT 90 tablet 1  . cholecalciferol (VITAMIN D) 1000 units tablet Take 10,000 Units by mouth daily.    . famotidine (PEPCID) 20 MG tablet Take 20 mg by mouth daily. OTC Med    . finasteride (PROSCAR) 5 MG tablet Take 5 mg by mouth daily.    Marland Kitchen MELATONIN GUMMIES PO Take by mouth.    Marland Kitchen OVER THE COUNTER MEDICATION     . colchicine 0.6 MG tablet Take two tablets (1.2mg ) at onset of gout symptoms.  Then take one tablet one hour later x1. (Patient not taking: Reported on 08/16/2020) 30 tablet 2  . phentermine 15 MG capsule Take 1 capsule (15 mg total) by mouth every morning. 30 capsule 0   No facility-administered medications prior to visit.   Past Medical History:  Diagnosis Date  . Allergy   . Anemia   . Anxiety   . DDD (degenerative disc disease)   . GERD (gastroesophageal reflux disease)   . Gilbert's syndrome    pt denies dx  . Gout   . Hyperlipidemia   . Hypertension   . Tubular adenoma of colon 06/2015   Past Surgical History:   Procedure Laterality Date  . CHOLECYSTECTOMY    . CHOLESTEATOMA EXCISION    . PROSTATE BIOPSY    . prostrate    . UPPER GASTROINTESTINAL ENDOSCOPY  2011  . VASECTOMY    . WISDOM TOOTH EXTRACTION Bilateral    Social History   Socioeconomic History  . Marital status: Married    Spouse name: Not on file  . Number of children: Not on file  . Years of education: Not on file  . Highest education level: Not on file  Occupational History  . Not on file  Tobacco Use  . Smoking status: Never Smoker  . Smokeless tobacco: Never Used  Substance and Sexual Activity  . Alcohol use: Yes    Alcohol/week: 0.0 standard drinks    Comment: social  . Drug use: No  . Sexual activity: Not on file  Other Topics Concern  . Not on file  Social History Narrative  . Not on file   Social Determinants of Health   Financial Resource Strain: Not on file  Food Insecurity: Not on file  Transportation Needs: Not on file  Physical Activity: Not on file  Stress: Not on file  Social Connections: Not on file   Family History  Problem Relation Age of Onset  . Lung cancer Mother   . Diabetes Father   . Hypertension Father   .  Colon cancer Neg Hx       Review of Systems: Pertinent positive and negative review of systems were noted in the above HPI section. All other review of systems were otherwise negative.    Physical Exam: General: Well developed, well nourished, no acute distress Head: Normocephalic and atraumatic Eyes: Sclerae anicteric, EOMI Ears: Normal auditory acuity Mouth: Not examined, mask on during Covid-19 pandemic Neck: Supple, no masses or thyromegaly Lungs: Clear throughout to auscultation Heart: Regular rate and rhythm; no murmurs, rubs or bruits Abdomen: Soft, non tender and non distended. No masses, hepatosplenomegaly or hernias noted. Normal Bowel sounds Rectal:  Deferred to colonoscopy  Musculoskeletal: Symmetrical with no gross deformities  Skin: No lesions on visible  extremities Pulses:  Normal pulses noted Extremities: No clubbing, cyanosis, edema or deformities noted Neurological: Alert oriented x 4, grossly nonfocal Cervical Nodes:  No significant cervical adenopathy Inguinal Nodes: No significant inguinal adenopathy Psychological:  Alert and cooperative. Normal mood and affect   Assessment and Recommendations:  1. Dysphagia. GERD. History of an esophageal stricture. Suspected recurrent esophageal stricture. Continue famotidine 20 mg daily. Follow antireflux measures. Likely will need to change to a PPI for long-term management after EGD. Schedule EGD with dilation. The risks (including bleeding, perforation, infection, missed lesions, medication reactions and possible hospitalization or surgery if complications occur), benefits, and alternatives to endoscopy with possible biopsy and possible dilation were discussed with the patient and they consent to proceed.   2. Personal history of adenomatous colon polyps. He is due for surveillance colonoscopy. Schedule colonoscopy. The risks (including bleeding, perforation, infection, missed lesions, medication reactions and possible hospitalization or surgery if complications occur), benefits, and alternatives to colonoscopy with possible biopsy and possible polypectomy were discussed with the patient and they consent to proceed.     cc: Unk Pinto, MD [address] [place],  [place] [address]

## 2020-08-17 ENCOUNTER — Other Ambulatory Visit: Payer: Self-pay

## 2020-08-17 ENCOUNTER — Encounter: Payer: Self-pay | Admitting: Adult Health Nurse Practitioner

## 2020-08-17 ENCOUNTER — Ambulatory Visit: Payer: 59 | Admitting: Adult Health Nurse Practitioner

## 2020-08-17 VITALS — BP 110/80 | HR 75 | Temp 97.2°F | Wt 272.0 lb

## 2020-08-17 DIAGNOSIS — K222 Esophageal obstruction: Secondary | ICD-10-CM

## 2020-08-17 DIAGNOSIS — Z6834 Body mass index (BMI) 34.0-34.9, adult: Secondary | ICD-10-CM | POA: Diagnosis not present

## 2020-08-17 DIAGNOSIS — R0989 Other specified symptoms and signs involving the circulatory and respiratory systems: Secondary | ICD-10-CM

## 2020-08-17 DIAGNOSIS — Z79899 Other long term (current) drug therapy: Secondary | ICD-10-CM

## 2020-08-17 DIAGNOSIS — Z7689 Persons encountering health services in other specified circumstances: Secondary | ICD-10-CM | POA: Diagnosis not present

## 2020-08-17 MED ORDER — PHENTERMINE HCL 37.5 MG PO TABS: ORAL_TABLET | ORAL | 5 refills | Status: DC

## 2020-08-17 NOTE — Patient Instructions (Signed)
   We are going to increase the Phentermine to 37.5mg  daily.  Take this 42min to 1hour prior to your first meal. We are going to continue to Phentermine 15mg  for this month. Take this 32min-1hour prior to eating your first meal.  This second month we are going to work on cutting sweet and desserts. This includes candy, doughnuts, cake, pie, cookies etc. You may have ONE single treat once a week.  Those successful with this pick one day a week they may have their one treat.  For example if you tend to go out for ice cream with the family on Saturdays, make Saturday your day.  Consistency with your day of the week gives you something to look forward to.  Continue to refrain from sugary drinks and keep monitoring your sugar intake.  Stay healthy and keep up the good work!

## 2020-08-17 NOTE — Progress Notes (Signed)
FOLLOW UP WEIGHT  Assessment and Plan: [name] was seen today for weight management and medication refill.  Diagnoses and all orders for this visit:  Encounter for weight management Continued Discussed dietary and exercise modifications Down 1lbs since last OV Discussed increasing activity Follow up in one month.  BMI 34.0-34.9,adult -     phentermine (ADIPEX-P) 37.5 MG tablet; Take 1 tablet every morning 78min prior to first meal, for dieting & weightloss  Labile hypertension Controlled today Monitor blood pressure at home; call if consistently over 130/80 Continue DASH diet.   Reminder to go to the ER if any CP, SOB, nausea, dizziness, severe HA, changes vision/speech, left arm numbness and tingling and jaw pain.  Gastroesophageal reflux disease, esophagitis presence not specified ESOPHAGEAL STRICTURE Continue PPI/H2 blocker, diet discussed Follow up in March 2022  Prediabetes Discussed general issues about diabetes pathophysiology and management., Educational material distributed., Suggested low cholesterol diet., Encouraged aerobic exercise., Discussed foot care., Reminded to get yearly retinal exam.   Medication management Continued    Further disposition pending results if labs check today. Discussed med's effects and SE's.   Over 30 minutes of face to face interview, exam, counseling, chart review, and critical decision making was performed.   HPI Patient presents for follow up after starting phemtermine 15mg  daily.  He reports he is taking this every morning. He reports that he tolerated this medication well.  He avoids fast food.  He has decreased his quantity of food.  He does drink diet Dr [name], he probably drinks three of these a day.  He did avoid sweet tea.  He is working from home.   He is taking Cetirizine (Zyrtec) every morning.  Reports that he is having some itching in his ears over the course of the last three to four week.  It is bilateral.  Denies  any popping cracking, change in hearing or dizziness.  Colonoscopy scheduled for March 2022.   He is also going to have EGD to check for recurrent stricture.   Reports he is starting to have an increase in difficultly swallowing with the first few bites of his food.  Reports he is not having any difficulties with liquids.   His blood pressure has been controlled at home, today their BP is   He does not workout. He denies chest pain, shortness of breath, dizziness. He was seen in 2010, [place] by Dr [name]. Will place referral for evaluation.  BMI is There is no height or weight on file to calculate BMI. Wt Readings from Last 3 Encounters:  08/16/20 273 lb (123.8 kg)  05/17/20 286 lb (129.7 kg)  03/18/19 273 lb (123.8 kg)   He is on cholesterol medication and denies myalgias. His cholesterol is at goal. The cholesterol last visit was:   Lab Results  Component Value Date   CHOL 182 05/17/2020   HDL 34 (L) 05/17/2020   LDLCALC 112 (H) 05/17/2020   TRIG 237 (H) 05/17/2020   CHOLHDL 5.4 (H) 05/17/2020   He has been working on diet and exercise for prediabetes, he is on bASA, he is not on ACE/ARB and denies foot ulcerations, hyperglycemia, hypoglycemia , increased appetite, nausea, paresthesia of the feet, polydipsia, polyuria, visual disturbances, vomiting and weight loss. Last A1C in the office was:  Lab Results  Component Value Date   HGBA1C 6.3 (H) 05/17/2020   Patient is on Vitamin D supplement, 5,000 IU a day.   Lab Results  Component Value Date   VD25OH  69 03/18/2019   Patient is on allopurinol for gout and does not report a recent flare x 2 years.  Lab Results  Component Value Date   LABURIC 5.6 03/18/2019     Has had some GERD, does take ibuprofen for lower back.   Reports ED symptoms since the start of using finasteride.  He started for hair loss.  Reports he is not able to maintain the erection or ejaculation.  Does not want to change at this time, not  currently sexually active.   BPH Currently he is symptomatic and endorses nocturia x 1-2 a night and denies urinary hesitancy, urinary frequency, incomplete voiding, double voiding, weak stream, perineal discomfort, dysuria, hematuria, abdominal pain, flank pain and testicular pain.  He is taking Finasteride (Propecia). He is following with urology at this time and last results were in normal. Lab Results  Component Value Date   PSA 3.97 05/17/2020      Current Medications:  Current Outpatient Medications on File Prior to Visit  Medication Sig Dispense Refill  . allopurinol (ZYLOPRIM) 300 MG tablet TAKE 1 TABLET DAILY TO PREVENT GOUT 90 tablet 1  . cholecalciferol (VITAMIN D) 1000 units tablet Take 10,000 Units by mouth daily.    . colchicine 0.6 MG tablet Take two tablets (1.2mg ) at onset of gout symptoms.  Then take one tablet one hour later x1. (Patient not taking: Reported on 08/16/2020) 30 tablet 2  . famotidine (PEPCID) 20 MG tablet Take 20 mg by mouth daily. OTC Med    . finasteride (PROSCAR) 5 MG tablet Take 5 mg by mouth daily.    Marland Kitchen MELATONIN GUMMIES PO Take by mouth.    Marland Kitchen OVER THE COUNTER MEDICATION      No current facility-administered medications on file prior to visit.    Health Maintenance:  Immunization History  Administered Date(s) Administered  . Influenza Inj Mdck Quad Pf 04/12/2018  . Influenza,inj,Quad PF,6+ Mos 04/05/2019  . Influenza-Unspecified 04/12/2018  . Pneumococcal-Unspecified 11/06/1995  . Td 11/01/2004  . Tdap 03/01/2015   Tetanus: 2016 Influenza: get in Oct Colonoscopy: 2016 due 5 years Dr. [name] , DUE, Pt to call, will place referral if needed. Eye Exam: Due 2021 Dentist:  04/2020, Q1months  Patient Care Team: Unk Pinto, MD as PCP - General (Internal Medicine) [name], MD as Attending Physician (Urology) Ladene Artist, MD as Consulting Physician (Gastroenterology) Kary Kos, MD as Consulting Physician  (Neurosurgery)  Medical History:  Past Medical History:  Diagnosis Date  . Allergy   . Anemia   . Anxiety   . DDD (degenerative disc disease)   . GERD (gastroesophageal reflux disease)   . Gilbert's syndrome    pt denies dx  . Gout   . Hyperlipidemia   . Hypertension   . Tubular adenoma of colon 06/2015    Allergies No Known Allergies  SURGICAL HISTORY He  has a past surgical history that includes Wisdom tooth extraction (Bilateral); Cholesteatoma excision; Vasectomy; Cholecystectomy; prostrate; Prostate biopsy; and Upper gastrointestinal endoscopy (2011). FAMILY HISTORY His family history includes Diabetes in his father; Hypertension in his father; Lung cancer in his mother. SOCIAL HISTORY He  reports that he has never smoked. He has never used smokeless tobacco. He reports current alcohol use. He reports that he does not use drugs.   Review of Systems:  Review of Systems  Constitutional: Negative for chills, diaphoresis, fever, malaise/fatigue and weight loss.  HENT: Negative for congestion, ear discharge, ear pain, hearing loss, nosebleeds, sinus pain, sore  throat and tinnitus.   Eyes: Negative.  Negative for blurred vision, double vision, photophobia, pain, discharge and redness.  Respiratory: Negative for cough, hemoptysis, sputum production, shortness of breath, wheezing and stridor.   Cardiovascular: Negative for chest pain, palpitations, orthopnea, claudication, leg swelling and PND.  Gastrointestinal: Negative for abdominal pain, blood in stool, constipation, diarrhea, heartburn, melena, nausea and vomiting.  Genitourinary: Negative.  Negative for dysuria, flank pain, frequency, hematuria and urgency.       ED, maintaining  Musculoskeletal: Positive for back pain. Negative for falls, joint pain, myalgias and neck pain.       Lumbar chronic  Skin: Negative for itching and rash.       allopecia  Neurological: Negative for dizziness, tingling, tremors, sensory change,  speech change, focal weakness, seizures, loss of consciousness, weakness and headaches.  Endo/Heme/Allergies: Negative for environmental allergies and polydipsia. Does not bruise/bleed easily.  Psychiatric/Behavioral: Negative for depression, hallucinations, memory loss, substance abuse and suicidal ideas. The patient is not nervous/anxious and does not have insomnia.     Physical Exam: Estimated body mass index is 35.05 kg/m as calculated from the following:   Height as of 08/16/20: 6\' 2"  (1.88 m).   Weight as of 08/16/20: 273 lb (123.8 kg). There were no vitals taken for this visit.  General Appearance: Well nourished, in no apparent distress.  Eyes: PERRLA, EOMs, conjunctiva no swelling or erythema ENT/Mouth: Ear canals clear bilaterally with no erythema, swelling, discharge.  TMs normal bilaterally with no erythema, bulging, or retractions.  Oropharynx clear and moist with no exudate, swelling, or erythema. Scalloping of tongue.  Dentition normal.   Neck: Supple, thyroid normal. No bruits, JVD, cervical adenopathy Respiratory: Respiratory effort normal, BS equal bilaterally without rales, rhonchi, wheezing or stridor.  Cardio: RRR without murmurs, rubs or gallops. Brisk peripheral pulses without edema.  Chest: symmetric, with normal excursions Abdomen: Soft, nontender, no guarding, rebound, hernias, masses, or organomegaly. Genitourinary: Deferred to urology Musculoskeletal: Full ROM all peripheral extremities,5/5 strength, and normal gait.  Skin:  Warm, dry without rashes, lesions, ecchymosis. Neuro: A&Ox3, Cranial nerves intact, reflexes equal bilaterally. Normal muscle tone, no cerebellar symptoms. Sensation intact.  Psych: Normal affect, Insight and Judgment appropriate.      Garnet Sierras, NP 9:21 AM [facility]

## 2020-08-28 ENCOUNTER — Other Ambulatory Visit: Payer: Self-pay | Admitting: Adult Health Nurse Practitioner

## 2020-08-28 DIAGNOSIS — Z6834 Body mass index (BMI) 34.0-34.9, adult: Secondary | ICD-10-CM

## 2020-08-28 MED ORDER — PHENTERMINE HCL 37.5 MG PO TABS: ORAL_TABLET | ORAL | 5 refills | Status: DC

## 2020-09-13 ENCOUNTER — Other Ambulatory Visit: Payer: Self-pay | Admitting: Urology

## 2020-09-13 DIAGNOSIS — R972 Elevated prostate specific antigen [PSA]: Secondary | ICD-10-CM

## 2020-09-14 ENCOUNTER — Encounter: Payer: Self-pay | Admitting: Adult Health Nurse Practitioner

## 2020-09-14 ENCOUNTER — Other Ambulatory Visit: Payer: Self-pay

## 2020-09-14 ENCOUNTER — Ambulatory Visit: Payer: 59 | Admitting: Adult Health Nurse Practitioner

## 2020-09-14 VITALS — BP 122/80 | HR 73 | Temp 97.3°F | Wt 272.0 lb

## 2020-09-14 DIAGNOSIS — Z6834 Body mass index (BMI) 34.0-34.9, adult: Secondary | ICD-10-CM

## 2020-09-14 DIAGNOSIS — R0989 Other specified symptoms and signs involving the circulatory and respiratory systems: Secondary | ICD-10-CM | POA: Diagnosis not present

## 2020-09-14 DIAGNOSIS — L659 Nonscarring hair loss, unspecified: Secondary | ICD-10-CM | POA: Diagnosis not present

## 2020-09-14 DIAGNOSIS — Z7689 Persons encountering health services in other specified circumstances: Secondary | ICD-10-CM

## 2020-09-14 MED ORDER — FINASTERIDE-MINOXIDIL 0.1-7 % EX SOLN: CUTANEOUS | 2 refills | Status: DC

## 2020-09-14 MED ORDER — PHENTERMINE HCL 37.5 MG PO TABS: ORAL_TABLET | ORAL | 5 refills | Status: DC

## 2020-09-14 NOTE — Progress Notes (Signed)
FOLLOW UP WEIGHT  Assessment and Plan: [name] was seen today for weight management and medication refill.  Diagnoses and all orders for this visit:  Encounter for weight management Continue Rx Phemtermin 37.5mg  Discussed dietary and exercise modifications Discussed increasing activity Follow up in one month.  BMI 34.0-34.9,adult -     phentermine (ADIPEX-P) 37.5 MG tablet; Take 1 tablet every morning 43min prior to first meal, for dieting & weightloss  Labile hypertension Controlled today Monitor blood pressure at home; call if consistently over 130/80 Continue DASH diet.   Reminder to go to the ER if any CP, SOB, nausea, dizziness, severe HA, changes vision/speech, left arm numbness and tingling and jaw pain.   Medication management Continued    Further disposition pending results if labs check today. Discussed med's effects and SE's.   Over 30 minutes of face to face interview, exam, counseling, chart review, and critical decision making was performed.   HPI Patient presents for follow up after starting phemtermine 15mg  daily.  He reports he is taking this every morning. He reports that he tolerated this medication well.  He avoids fast food.  He has decreased his quantity of food.  He does drink diet Dr [name], he probably drinks three of these a day.  He did avoid sweet tea. He would like to increase this medication for continued benefit.  He is working from home.   He is taking Cetirizine (Zyrtec) every morning.  Reports that he is having some itching in his ears over the course of the last three to four week.  It is bilateral.  Denies any popping cracking, change in hearing or dizziness.  Colonoscopy scheduled for March 2022.   He is also going to have EGD to check for recurrent stricture.   Reports he is starting to have an increase in difficultly swallowing with the first few bites of his food.  Reports he is not having any difficulties with liquids.    His blood  pressure has been controlled at home, today their BP is BP: 122/80 He does not workout. He denies chest pain, shortness of breath, dizziness. He was seen in 2010, [place] by Dr [name]. Will place referral for evaluation.  BMI is Body mass index is 34.92 kg/m. Wt Readings from Last 3 Encounters:  09/14/20 272 lb (123.4 kg)  08/17/20 272 lb (123.4 kg)  08/16/20 273 lb (123.8 kg)   He is on cholesterol medication and denies myalgias. His cholesterol is at goal. The cholesterol last visit was:   Lab Results  Component Value Date   CHOL 182 05/17/2020   HDL 34 (L) 05/17/2020   LDLCALC 112 (H) 05/17/2020   TRIG 237 (H) 05/17/2020   CHOLHDL 5.4 (H) 05/17/2020   He has been working on diet and exercise for prediabetes, he is on bASA, he is not on ACE/ARB and denies foot ulcerations, hyperglycemia, hypoglycemia , increased appetite, nausea, paresthesia of the feet, polydipsia, polyuria, visual disturbances, vomiting and weight loss. Last A1C in the office was:  Lab Results  Component Value Date   HGBA1C 6.3 (H) 05/17/2020   Patient is on Vitamin D supplement, 5,000 IU a day.   Lab Results  Component Value Date   VD25OH 69 03/18/2019   Patient is on allopurinol for gout and does not report a recent flare x 2 years.  Lab Results  Component Value Date   LABURIC 5.6 03/18/2019     Has had some GERD, does take ibuprofen for lower  back.   Reports ED symptoms since the start of using finasteride.  He started for hair loss.  Reports he is not able to maintain the erection or ejaculation.  Does not want to change at this time, not currently sexually active.  He has endoscopy and colonoscopy schedule.  Try the spray topically.    BPH Currently he is symptomatic and endorses nocturia x 1-2 a night and denies urinary hesitancy, urinary frequency, incomplete voiding, double voiding, weak stream, perineal discomfort, dysuria, hematuria, abdominal pain, flank pain and testicular  pain.  He is taking Finasteride (Propecia). He is following with urology at this time and last results were in normal. Lab Results  Component Value Date   PSA 3.97 05/17/2020      Current Medications:  Current Outpatient Medications on File Prior to Visit  Medication Sig Dispense Refill  . allopurinol (ZYLOPRIM) 300 MG tablet TAKE 1 TABLET DAILY TO PREVENT GOUT 90 tablet 1  . cholecalciferol (VITAMIN D) 1000 units tablet Take 10,000 Units by mouth daily.    . colchicine 0.6 MG tablet Take two tablets (1.2mg ) at onset of gout symptoms.  Then take one tablet one hour later x1. (Patient taking differently: Take two tablets (1.2mg ) at onset of gout symptoms.  Then take one tablet one hour later x1.) 30 tablet 2  . famotidine (PEPCID) 20 MG tablet Take 20 mg by mouth daily. OTC Med    . MELATONIN GUMMIES PO Take by mouth.    Marland Kitchen OVER THE COUNTER MEDICATION      No current facility-administered medications on file prior to visit.    Health Maintenance:  Immunization History  Administered Date(s) Administered  . Influenza Inj Mdck Quad Pf 04/12/2018  . Influenza,inj,Quad PF,6+ Mos 04/05/2019  . Influenza-Unspecified 04/12/2018  . Pneumococcal-Unspecified 11/06/1995  . Td 11/01/2004  . Tdap 03/01/2015   Tetanus: 2016 Influenza: get in Oct Colonoscopy: 2016 due 5 years Dr. [name] , DUE, Pt to call, will place referral if needed. Eye Exam: Due 2021 Dentist:  04/2020, Q34months  Patient Care Team: Unk Pinto, MD as PCP - General (Internal Medicine) [name], MD as Attending Physician (Urology) Ladene Artist, MD as Consulting Physician (Gastroenterology) Kary Kos, MD as Consulting Physician (Neurosurgery)  Medical History:  Past Medical History:  Diagnosis Date  . Allergy   . Anemia   . Anxiety   . DDD (degenerative disc disease)   . GERD (gastroesophageal reflux disease)   . Gilbert's syndrome    pt denies dx  . Gout   . Hyperlipidemia   . Hypertension   .  Tubular adenoma of colon 06/2015    Allergies No Known Allergies  SURGICAL HISTORY He  has a past surgical history that includes Wisdom tooth extraction (Bilateral); Cholesteatoma excision; Vasectomy; Cholecystectomy; prostrate; Prostate biopsy; and Upper gastrointestinal endoscopy (2011). FAMILY HISTORY His family history includes Diabetes in his father; Hypertension in his father; Lung cancer in his mother. SOCIAL HISTORY He  reports that he has never smoked. He has never used smokeless tobacco. He reports current alcohol use. He reports that he does not use drugs.   Review of Systems:  Review of Systems  Constitutional: Negative for chills, diaphoresis, fever, malaise/fatigue and weight loss.  HENT: Negative for congestion, ear discharge, ear pain, hearing loss, nosebleeds, sinus pain, sore throat and tinnitus.   Eyes: Negative.  Negative for blurred vision, double vision, photophobia, pain, discharge and redness.  Respiratory: Negative for cough, hemoptysis, sputum production, shortness of breath, wheezing and  stridor.   Cardiovascular: Negative for chest pain, palpitations, orthopnea, claudication, leg swelling and PND.  Gastrointestinal: Negative for abdominal pain, blood in stool, constipation, diarrhea, heartburn, melena, nausea and vomiting.  Genitourinary: Negative.  Negative for dysuria, flank pain, frequency, hematuria and urgency.       ED, maintaining  Musculoskeletal: Negative for falls, joint pain, myalgias and neck pain.  Skin: Negative for itching and rash.       allopecia  Neurological: Negative for dizziness, tingling, tremors, sensory change, speech change, focal weakness, seizures, loss of consciousness, weakness and headaches.  Endo/Heme/Allergies: Negative for environmental allergies and polydipsia. Does not bruise/bleed easily.  Psychiatric/Behavioral: Negative for depression, hallucinations, memory loss, substance abuse and suicidal ideas. The patient is not  nervous/anxious and does not have insomnia.     Physical Exam: Estimated body mass index is 34.92 kg/m as calculated from the following:   Height as of 08/16/20: 6\' 2"  (1.88 m).   Weight as of this encounter: 272 lb (123.4 kg). BP 122/80   Pulse 73   Temp (!) 97.3 F (36.3 C)   Wt 272 lb (123.4 kg)   SpO2 96%   BMI 34.92 kg/m   General Appearance: Well nourished, in no apparent distress.  Eyes: PERRLA, EOMs, conjunctiva no swelling or erythema ENT/Mouth: Ear canals clear bilaterally with no erythema, swelling, discharge.  TMs normal bilaterally with no erythema, bulging, or retractions.  Oropharynx clear and moist with no exudate, swelling, or erythema. Scalloping of tongue.  Dentition normal.   Neck: Supple, thyroid normal. No bruits, JVD, cervical adenopathy Respiratory: Respiratory effort normal, BS equal bilaterally without rales, rhonchi, wheezing or stridor.  Cardio: RRR without murmurs, rubs or gallops. Brisk peripheral pulses without edema.  Chest: symmetric, with normal excursions Abdomen: Soft, nontender, no guarding, rebound, hernias, masses, or organomegaly. Genitourinary: Deferred to urology Musculoskeletal: Full ROM all peripheral extremities,5/5 strength, and normal gait.  Skin:  Warm, dry without rashes, lesions, ecchymosis. Neuro: A&Ox3, Cranial nerves intact, reflexes equal bilaterally. Normal muscle tone, no cerebellar symptoms. Sensation intact.  Psych: Normal affect, Insight and Judgment appropriate.      Garnet Sierras, NP 12:55 PM [facility]

## 2020-09-15 ENCOUNTER — Encounter: Payer: 59 | Admitting: Gastroenterology

## 2020-09-20 ENCOUNTER — Encounter: Payer: 59 | Admitting: Gastroenterology

## 2020-09-26 ENCOUNTER — Other Ambulatory Visit: Payer: Self-pay | Admitting: Adult Health Nurse Practitioner

## 2020-09-26 DIAGNOSIS — Z6834 Body mass index (BMI) 34.0-34.9, adult: Secondary | ICD-10-CM

## 2020-09-29 ENCOUNTER — Encounter: Payer: Self-pay | Admitting: Gastroenterology

## 2020-10-03 ENCOUNTER — Other Ambulatory Visit: Payer: Self-pay

## 2020-10-03 ENCOUNTER — Encounter: Payer: Self-pay | Admitting: Gastroenterology

## 2020-10-03 ENCOUNTER — Ambulatory Visit: Payer: 59 | Admitting: Gastroenterology

## 2020-10-03 VITALS — BP 156/91 | HR 87 | Temp 97.5°F | Ht 74.0 in | Wt 273.0 lb

## 2020-10-03 MED ORDER — SODIUM CHLORIDE 0.9 % IV SOLN: 500.0000 mL | Freq: Once | INTRAVENOUS | Status: DC

## 2020-10-03 NOTE — Progress Notes (Signed)
C.W. vital signs. 

## 2020-10-03 NOTE — Progress Notes (Signed)
Pt's states no medical or surgical changes since previsit or office visit. 

## 2020-10-03 NOTE — Progress Notes (Signed)
Pt reports that he took phentermine in the past 10 years.  Per Dr. [name] will not be able to have procedure today.  Will need to reschedule for at least 10 days from now for pt to have EGD.  Pt rescheduled for 10/20/20 @ 10:00 am.  Instructions printed and reviewed with pt.

## 2020-10-05 ENCOUNTER — Ambulatory Visit
Admission: RE | Admit: 2020-10-05 | Discharge: 2020-10-05 | Disposition: A | Discharge status: Home / Self Care | Payer: 59 | Source: Ambulatory Visit | Attending: Urology | Admitting: Urology

## 2020-10-05 ENCOUNTER — Other Ambulatory Visit: Payer: Self-pay

## 2020-10-05 DIAGNOSIS — R972 Elevated prostate specific antigen [PSA]: Secondary | ICD-10-CM

## 2020-10-05 MED ORDER — GADOBENATE DIMEGLUMINE 529 MG/ML IV SOLN
20.0000 mL | Freq: Once | INTRAVENOUS | Status: AC | PRN
  Administered 2020-10-05: 20 mL via INTRAVENOUS

## 2020-10-12 ENCOUNTER — Encounter: Payer: Self-pay | Admitting: Gastroenterology

## 2020-10-20 ENCOUNTER — Encounter: Payer: Self-pay | Admitting: Gastroenterology

## 2020-10-20 ENCOUNTER — Ambulatory Visit (AMBULATORY_SURGERY_CENTER): Payer: 59 | Admitting: Gastroenterology

## 2020-10-20 ENCOUNTER — Other Ambulatory Visit: Payer: Self-pay

## 2020-10-20 VITALS — BP 122/75 | HR 66 | Temp 97.0°F | Resp 14 | Ht 74.0 in | Wt 273.0 lb

## 2020-10-20 DIAGNOSIS — K2289 Other specified disease of esophagus: Secondary | ICD-10-CM | POA: Diagnosis not present

## 2020-10-20 DIAGNOSIS — K2950 Unspecified chronic gastritis without bleeding: Secondary | ICD-10-CM | POA: Diagnosis not present

## 2020-10-20 DIAGNOSIS — R1310 Dysphagia, unspecified: Secondary | ICD-10-CM

## 2020-10-20 DIAGNOSIS — K222 Esophageal obstruction: Secondary | ICD-10-CM | POA: Diagnosis not present

## 2020-10-20 DIAGNOSIS — K2970 Gastritis, unspecified, without bleeding: Secondary | ICD-10-CM

## 2020-10-20 DIAGNOSIS — K200 Eosinophilic esophagitis: Secondary | ICD-10-CM | POA: Diagnosis not present

## 2020-10-20 MED ORDER — PANTOPRAZOLE SODIUM 40 MG PO TBEC: 40.0000 mg | DELAYED_RELEASE_TABLET | Freq: Every day | ORAL | 4 refills | Status: DC

## 2020-10-20 MED ORDER — SODIUM CHLORIDE 0.9 % IV SOLN: 500.0000 mL | Freq: Once | INTRAVENOUS | Status: DC

## 2020-10-20 NOTE — Progress Notes (Signed)
Report to PACU, RN, vss, BBS= Clear.  

## 2020-10-20 NOTE — Op Note (Signed)
[place] Patient Name: [name] Procedure Date: 10/20/2020 9:52 AM MRN: [id number] Endoscopist: Ladene Artist , MD Age: 55 Referring MD:  Date of Birth: Aug 27, 1965 Gender: Male Account #: [id number] Procedure:                Upper GI endoscopy Indications:              Dysphagia Medicines:                Monitored Anesthesia Care Procedure:                Pre-Anesthesia Assessment:                           - Prior to the procedure, a History and Physical                            was performed, and patient medications and                            allergies were reviewed. The patient's tolerance of                            previous anesthesia was also reviewed. The risks                            and benefits of the procedure and the sedation                            options and risks were discussed with the patient.                            All questions were answered, and informed consent                            was obtained. Prior Anticoagulants: The patient has                            taken no previous anticoagulant or antiplatelet                            agents. ASA Grade Assessment: II - A patient with                            mild systemic disease. After reviewing the risks                            and benefits, the patient was deemed in                            satisfactory condition to undergo the procedure.                           After obtaining informed consent, the endoscope was  passed under direct vision. Throughout the                            procedure, the patient's blood pressure, pulse, and                            oxygen saturations were monitored continuously. The                            Endoscope was introduced through the mouth, and                            advanced to the second part of duodenum. The upper                            GI endoscopy was accomplished without  difficulty.                            The patient tolerated the procedure well. Scope In: Scope Out: Findings:                 Multiple benign-appearing, intrinsic moderate                            stenoses were found in the mid and distal                            esophagus. The narrowest stenosis measured 1.2 cm                            (inner diameter) x less than one cm (in length).                            The stenoses were traversed. A guidewire was placed                            and the scope was withdrawn. Dilations were                            performed with Savary dilators with mild resistance                            at 14 mm and 15 mm. Small heme on the dilators.                           Mucosal changes including ringed esophagus and                            longitudinal furrows were found in the mid                            esophagus and in the distal esophagus. Biopsies  were taken with a cold forceps for histology.                           The exam of the esophagus was otherwise normal.                           Patchy mildly erythematous mucosa without bleeding                            was found in the gastric body. Biopsies were taken                            with a cold forceps for histology.                           The exam of the stomach was otherwise normal.                           A few localized, small erosions without bleeding                            were found in the duodenal bulb.                           The exam of the duodenum was otherwise normal. Complications:            No immediate complications. Estimated Blood Loss:     Estimated blood loss was minimal. Impression:               - Benign-appearing esophageal stenoses. Dilated.                           - Esophageal mucosal changes suggestive of                            eosinophilic esophagitis. Biopsied.                           -  Erythematous mucosa in the gastric body. Biopsied.                           - Erosive duodenopathy without bleeding. Recommendation:           - Patient has a contact number available for                            emergencies. The signs and symptoms of potential                            delayed complications were discussed with the                            patient. Return to normal activities tomorrow.                            Written discharge  instructions were provided to the                            patient.                           - Clear liquid diet for 2 hours, then advance as                            tolerated to soft diet today.                           - Resume prior diet tomorrow.                           - Continue present medications.                           - Await pathology results.                           - Protonix (pantoprazole) 40 mg PO daily, 1 year of                            refills.                           - Return to GI office in 6 weeks. Ladene Artist, MD 10/20/2020 10:24:06 AM This report has been signed electronically.

## 2020-10-20 NOTE — Patient Instructions (Signed)
YOU HAD AN ENDOSCOPIC PROCEDURE TODAY AT [facility]:   Refer to the procedure report that was given to you for any specific questions about what was found during the examination.  If the procedure report does not answer your questions, please call your gastroenterologist to clarify.  If you requested that your care partner not be given the details of your procedure findings, then the procedure report has been included in a sealed envelope for you to review at your convenience later.  YOU SHOULD EXPECT: Some feelings of bloating in the abdomen. Passage of more gas than usual.  Walking can help get rid of the air that was put into your GI tract during the procedure and reduce the bloating. If you had a lower endoscopy (such as a colonoscopy or flexible sigmoidoscopy) you may notice spotting of blood in your stool or on the toilet paper. If you underwent a bowel prep for your procedure, you may not have a normal bowel movement for a few days.  Please Note:  You might notice some irritation and congestion in your nose or some drainage.  This is from the oxygen used during your procedure.  There is no need for concern and it should clear up in a day or so.  SYMPTOMS TO REPORT IMMEDIATELY:    Following upper endoscopy (EGD)  Vomiting of blood or coffee ground material  New chest pain or pain under the shoulder blades  Painful or persistently difficult swallowing  New shortness of breath  Fever of 100F or higher  Black, tarry-looking stools  For urgent or emergent issues, a gastroenterologist can be reached at any hour by calling [phone]. Do not use MyChart messaging for urgent concerns.    DIET:  Follow post dilation diet.   ACTIVITY:  You should plan to take it easy for the rest of today and you should NOT DRIVE or use heavy machinery until tomorrow (because of the sedation medicines used during the test).    FOLLOW UP: Our staff will call the number listed on your  records 48-72 hours following your procedure to check on you and address any questions or concerns that you may have regarding the information given to you following your procedure. If we do not reach you, we will leave a message.  We will attempt to reach you two times.  During this call, we will ask if you have developed any symptoms of COVID 19. If you develop any symptoms (ie: fever, flu-like symptoms, shortness of breath, cough etc.) before then, please call [phone].  If you test positive for Covid 19 in the 2 weeks post procedure, please call and report this information to [place].    If any biopsies were taken you will be contacted by phone or by letter within the next 1-3 weeks.  Please call us at [phone] if you have not heard about the biopsies in 3 weeks.    SIGNATURES/CONFIDENTIALITY: You and/or your care partner have signed paperwork which will be entered into your electronic medical record.  These signatures attest to the fact that that the information above on your After Visit Summary has been reviewed and is understood.  Full responsibility of the confidentiality of this discharge information lies with you and/or your care-partner.

## 2020-10-20 NOTE — Progress Notes (Signed)
Called to room to assist during endoscopic procedure.  Patient ID and intended procedure confirmed with present staff. Received instructions for my participation in the procedure from the performing physician.  

## 2020-10-24 ENCOUNTER — Telehealth: Payer: Self-pay

## 2020-10-24 NOTE — Telephone Encounter (Signed)
  Follow up Call-  Call back number 10/20/2020 10/03/2020  Post procedure Call Back phone  # [phone]  Permission to leave phone message Yes Yes  Some recent data might be hidden     Patient questions:  Do you have a fever, pain , or abdominal swelling? No. Pain Score  0 *  Have you tolerated food without any problems? Yes.    Have you been able to return to your normal activities? Yes.    Do you have any questions about your discharge instructions: Diet   No. Medications  No. Follow up visit  No.  Do you have questions or concerns about your Care? No.  Actions: * If pain score is 4 or above: No action needed, pain <4. 1. Have you developed a fever since your procedure? no  2.   Have you had an respiratory symptoms (SOB or cough) since your procedure? no  3.   Have you tested positive for COVID 19 since your procedure no  4.   Have you had any family members/close contacts diagnosed with the COVID 19 since your procedure?  no   If yes to any of these questions please route to Joylene John, RN and Joella Prince, RN

## 2020-11-10 ENCOUNTER — Other Ambulatory Visit: Payer: Self-pay

## 2020-11-10 MED ORDER — FLOVENT HFA 220 MCG/ACT IN AERO: INHALATION_SPRAY | RESPIRATORY_TRACT | 1 refills | Status: DC

## 2020-11-15 ENCOUNTER — Ambulatory Visit: Payer: 59 | Admitting: Adult Health Nurse Practitioner

## 2020-11-30 DIAGNOSIS — R7309 Other abnormal glucose: Secondary | ICD-10-CM | POA: Insufficient documentation

## 2020-11-30 DIAGNOSIS — K200 Eosinophilic esophagitis: Secondary | ICD-10-CM | POA: Insufficient documentation

## 2020-11-30 NOTE — Progress Notes (Signed)
FOLLOW UP WEIGHT  Assessment and Plan:  Eh was seen today for follow-up.  Diagnoses and all orders for this visit:  Labile hypertension Continue medication Monitor blood pressure at home; call if consistently over 130/80 Continue DASH diet.   Reminder to go to the ER if any CP, SOB, nausea, dizziness, severe HA, changes vision/speech, left arm numbness and tingling and jaw pain. -     CBC with Differential/Platelet -     COMPLETE METABOLIC PANEL WITH GFR -     Magnesium  Hyperlipidemia, unspecified hyperlipidemia type Mild elevations managed with lifestyle LDL goal <100, start med if trending up Continue low cholesterol diet and exercise.  Check lipid panel.  -     Lipid panel -     TSH  Other abnormal glucose (prediabetes) Discussed disease and risks Discussed diet/exercise, weight management  -     Hemoglobin A1c  Chronic gout without tophus, unspecified cause, unspecified site Continue allopurinol; check uric acid PRN  Obesity with co morbidities Long discussion about weight loss, diet, and exercise Discussed final goal weight and current weight loss goal  Patient will work on portions, increase fiber Patient on phentermine with benefit and no SE, taking drug breaks; continue close follow up. Return in 6 months.   -     phentermine (ADIPEX-P) 37.5 MG tablet; Take 1 tablet every morning 70min prior to first meal, for dieting & weightloss  Medication management -     CBC with Differential/Platelet -     COMPLETE METABOLIC PANEL WITH GFR -     Magnesium  Vitamin D deficiency Continue supplement  Eosinophilic esophagitis GI following; resent med refill -     fluticasone (FLOVENT HFA) 220 MCG/ACT inhaler; Swallow,  do not inhale,  2 puffs twice a day. Do not eat or drink for 30 minutes after use. Rinse mouth well after use.  Alopecia Didn't tolerate oral finasteride; will resent topical that was discussed to compound pharmacy  -     Finasteride-Minoxidil 0.1-7  % SOLN; Use topically twice a day.  Prostate cancer [facility]) Dr. Jeffie Pollock and oncology panel following - has upcoming appointment to discuss treatment options   Further disposition pending results if labs check today. Discussed med's effects and SE's.   Over 30 minutes of face to face interview, exam, counseling, chart review, and critical decision making was performed.   Future Appointments  Date Time Provider [place]  12/22/2020  8:00 AM Tyler Pita, MD [facility] None  12/26/2020 11:10 AM Ladene Artist, MD [facility]  05/17/2021 10:00 AM Garnet Sierras, NP [facility] None     HPI 55 y.o. male here for 6 month follow up on hypertension, hyperlipidemia, prediabetes, gout, GERD, morbid obesity and vitamin D def.   He has GERD, hx of esophageal stricture, recently 10/20/2020 had EGD with dilation by Dr. [name]; showed chronic gastritis and eosinophilic esophagitis, started on pantoprazole 40 mg daily, flovent steroid swallowed BID and planned 6 week follow up in office. Reports sx are improving.   Has been following with Dr. Jeffie Pollock at [facility] for hx of elevated PSA, had MRI followed by biopsy on 11/22/2020 which showed gleason 4+3=7, has been referred to discussed with cancer center, scheduled with Dr. Tammi Klippel 12/22/2020. He has nocturia x 2 at night, some urgency, otherwise denies sx.   Lab Results  Component Value Date   PSA 3.97 05/17/2020   PSA 2.5 03/11/2017   He had left lumbar rash with pain suspect for shingles that resolved  without antiviral, interested in shingrix. Discussed - can get at pharmacy.   he was prescribed phentermine for weight loss but has been off med for several months ,did find very helpful and lost ? 8 lb.  While on the medication they have lost 2 lbs since last visit. They deny palpitations, anxiety, trouble sleeping, elevated BP.   BMI is Body mass index is 34.79 kg/m., he is working on diet and exercise. Wt Readings from Last 3  Encounters:  12/01/20 271 lb (122.9 kg)  10/20/20 273 lb (123.8 kg)  10/03/20 273 lb (123.8 kg)   His blood pressure has been controlled at home, today their BP is BP: 132/78 He does not workout. He denies chest pain, shortness of breath, dizziness.  He is on cholesterol medication and denies myalgias. His cholesterol is not at goal. The cholesterol last visit was:   Lab Results  Component Value Date   CHOL 182 05/17/2020   HDL 34 (L) 05/17/2020   LDLCALC 112 (H) 05/17/2020   TRIG 237 (H) 05/17/2020   CHOLHDL 5.4 (H) 05/17/2020   He has been working on diet and exercise for prediabetes, he is on bASA, he is not on ACE/ARB and denies foot ulcerations, hyperglycemia, hypoglycemia , increased appetite, nausea, paresthesia of the feet, polydipsia, polyuria, visual disturbances, vomiting and weight loss. Last A1C in the office was:  Lab Results  Component Value Date   HGBA1C 6.3 (H) 05/17/2020   Patient is on Vitamin D supplement, 5,000 IU a day.   Lab Results  Component Value Date   VD25OH 69 03/18/2019   Patient is on allopurinol for gout and does not report a recent flare x 2 years.  Lab Results  Component Value Date   LABURIC 5.6 03/18/2019          Current Medications:  Current Outpatient Medications on File Prior to Visit  Medication Sig Dispense Refill  . allopurinol (ZYLOPRIM) 300 MG tablet TAKE 1 TABLET DAILY TO PREVENT GOUT 90 tablet 1  . cholecalciferol (VITAMIN D) 1000 units tablet Take 10,000 Units by mouth daily.    . colchicine 0.6 MG tablet Take two tablets (1.2mg ) at onset of gout symptoms.  Then take one tablet one hour later x1. (Patient taking differently: Take two tablets (1.2mg ) at onset of gout symptoms.  Then take one tablet one hour later x1.) 30 tablet 2  . MELATONIN GUMMIES PO Take by mouth.    . pantoprazole (PROTONIX) 40 MG tablet Take 1 tablet (40 mg total) by mouth daily. 90 tablet 4   No current facility-administered medications on file prior to  visit.   Medical History:  Past Medical History:  Diagnosis Date  . Allergy   . Anemia   . Anxiety   . DDD (degenerative disc disease)   . GERD (gastroesophageal reflux disease)   . Gilbert's syndrome    pt denies dx  . Gout   . Hyperlipidemia   . Hypertension   . Tubular adenoma of colon 06/2015    Allergies No Known Allergies  SURGICAL HISTORY He  has a past surgical history that includes Wisdom tooth extraction (Bilateral); Cholesteatoma excision; Vasectomy; Cholecystectomy; prostrate; Prostate biopsy; and Upper gastrointestinal endoscopy (2011). FAMILY HISTORY His family history includes Diabetes in his father; Hypertension in his father; Lung cancer in his mother. SOCIAL HISTORY He  reports that he has never smoked. He has never used smokeless tobacco. He reports current alcohol use. He reports that he does not use drugs.  Review of Systems:  Review of Systems  Constitutional: Negative for malaise/fatigue and weight loss.  HENT: Negative for hearing loss and tinnitus.   Eyes: Negative for blurred vision and double vision.  Respiratory: Negative for cough, shortness of breath and wheezing.   Cardiovascular: Negative for chest pain, palpitations, orthopnea, claudication and leg swelling.  Gastrointestinal: Negative for abdominal pain, blood in stool, constipation, diarrhea, heartburn, melena, nausea and vomiting.  Genitourinary: Positive for urgency (rarely). Negative for dysuria, flank pain, frequency and hematuria.  Musculoskeletal: Negative for joint pain and myalgias.  Skin: Negative for rash.  Neurological: Negative for dizziness, tingling, sensory change, weakness and headaches.  Endo/Heme/Allergies: Negative for polydipsia.  Psychiatric/Behavioral: Negative.   All other systems reviewed and are negative.   Physical Exam: Estimated body mass index is 34.79 kg/m as calculated from the following:   Height as of this encounter: 6\' 2"  (1.88 m).   Weight as of  this encounter: 271 lb (122.9 kg). BP 132/78   Pulse 80   Temp 97.7 F (36.5 C)   Ht 6\' 2"  (1.88 m)   Wt 271 lb (122.9 kg)   SpO2 96%   BMI 34.79 kg/m   General Appearance: Well nourished, in no apparent distress.  Eyes: PERRLA, EOMs, conjunctiva no swelling or erythema ENT/Mouth: Ear canals clear bilaterally with no erythema, swelling, discharge.  TMs normal bilaterally with no erythema, bulging, or retractions.  Oropharynx clear and moist with no exudate, swelling, or erythema. Scalloping of tongue.  Dentition normal.   Neck: Supple, thyroid normal. No bruits, JVD, cervical adenopathy Respiratory: Respiratory effort normal, BS equal bilaterally without rales, rhonchi, wheezing or stridor.  Cardio: RRR without murmurs, rubs or gallops. Brisk peripheral pulses without edema.  Chest: symmetric, with normal excursions Abdomen: Soft, nontender, no guarding, rebound, hernias, masses, or organomegaly. Genitourinary: Deferred to urology Musculoskeletal: Full ROM all peripheral extremities,5/5 strength, and normal gait.  Skin:  Warm, dry without rashes, lesions, ecchymosis. Thinning hair in male pattern distribution.  Neuro: A&Ox3, Cranial nerves intact, reflexes equal bilaterally. Normal muscle tone, no cerebellar symptoms. Sensation intact.  Psych: Normal affect, Insight and Judgment appropriate.   Izora Ribas, NP 12:55 PM [facility]

## 2020-12-01 ENCOUNTER — Other Ambulatory Visit: Payer: Self-pay

## 2020-12-01 ENCOUNTER — Other Ambulatory Visit: Payer: Self-pay | Admitting: Adult Health

## 2020-12-01 ENCOUNTER — Encounter: Payer: Self-pay | Admitting: Adult Health

## 2020-12-01 ENCOUNTER — Ambulatory Visit: Payer: 59 | Admitting: Adult Health

## 2020-12-01 VITALS — BP 132/78 | HR 80 | Temp 97.7°F | Ht 74.0 in | Wt 271.0 lb

## 2020-12-01 DIAGNOSIS — Z79899 Other long term (current) drug therapy: Secondary | ICD-10-CM

## 2020-12-01 DIAGNOSIS — E785 Hyperlipidemia, unspecified: Secondary | ICD-10-CM

## 2020-12-01 DIAGNOSIS — L659 Nonscarring hair loss, unspecified: Secondary | ICD-10-CM

## 2020-12-01 DIAGNOSIS — M1A9XX0 Chronic gout, unspecified, without tophus (tophi): Secondary | ICD-10-CM

## 2020-12-01 DIAGNOSIS — R0989 Other specified symptoms and signs involving the circulatory and respiratory systems: Secondary | ICD-10-CM | POA: Diagnosis not present

## 2020-12-01 DIAGNOSIS — R7309 Other abnormal glucose: Secondary | ICD-10-CM

## 2020-12-01 DIAGNOSIS — K200 Eosinophilic esophagitis: Secondary | ICD-10-CM

## 2020-12-01 DIAGNOSIS — E559 Vitamin D deficiency, unspecified: Secondary | ICD-10-CM

## 2020-12-01 DIAGNOSIS — C61 Malignant neoplasm of prostate: Secondary | ICD-10-CM

## 2020-12-01 DIAGNOSIS — E669 Obesity, unspecified: Secondary | ICD-10-CM

## 2020-12-01 MED ORDER — PHENTERMINE HCL 37.5 MG PO TABS
ORAL_TABLET | ORAL | 5 refills | Status: DC
Start: 2020-12-01 — End: 2021-01-30

## 2020-12-01 MED ORDER — FINASTERIDE-MINOXIDIL 0.1-7 % EX SOLN: CUTANEOUS | 2 refills | Status: DC

## 2020-12-01 MED ORDER — MINOXIDIL FOR MEN 5 % EX FOAM: CUTANEOUS | 1 refills | Status: DC

## 2020-12-01 MED ORDER — FLOVENT HFA 220 MCG/ACT IN AERO: INHALATION_SPRAY | RESPIRATORY_TRACT | 1 refills | Status: DC

## 2020-12-01 NOTE — Patient Instructions (Addendum)
    Ask insurance about shingrix coverage - [facility] or [facility]  2 shots 2-6 months apart    Zoster Vaccine, Recombinant injection What is this medicine? ZOSTER VACCINE (ZOS ter vak SEEN) is a vaccine used to reduce the risk of getting shingles. This vaccine is not used to treat shingles or nerve pain from shingles. This medicine may be used for other purposes; ask your health care provider or pharmacist if you have questions. COMMON BRAND NAME(S): [facility] What should I tell my health care provider before I take this medicine? They need to know if you have any of these conditions:  cancer  immune system problems  an unusual or allergic reaction to Zoster vaccine, other medications, foods, dyes, or preservatives  pregnant or trying to get pregnant  breast-feeding How should I use this medicine? This vaccine is injected into a muscle. It is given by a health care provider. A copy of Vaccine Information Statements will be given before each vaccination. Be sure to read this information carefully each time. This sheet may change often. Talk to your health care provider about the use of this vaccine in children. This vaccine is not approved for use in children. Overdosage: If you think you have taken too much of this medicine contact a poison control center or emergency room at once. NOTE: This medicine is only for you. Do not share this medicine with others. What if I miss a dose? Keep appointments for follow-up (booster) doses. It is important not to miss your dose. Call your health care provider if you are unable to keep an appointment. What may interact with this medicine?  medicines that suppress your immune system  medicines to treat cancer  steroid medicines like prednisone or cortisone This list may not describe all possible interactions. Give your health care provider a list of all the medicines, herbs, non-prescription drugs, or dietary supplements you use. Also tell them if  you smoke, drink alcohol, or use illegal drugs. Some items may interact with your medicine. What should I watch for while using this medicine? Visit your health care provider regularly. This vaccine, like all vaccines, may not fully protect everyone. What side effects may I notice from receiving this medicine? Side effects that you should report to your doctor or health care professional as soon as possible:  allergic reactions (skin rash, itching or hives; swelling of the face, lips, or tongue)  trouble breathing Side effects that usually do not require medical attention (report these to your doctor or health care professional if they continue or are bothersome):  chills  headache  fever  nausea  pain, redness, or irritation at site where injected  tiredness  vomiting This list may not describe all possible side effects. Call your doctor for medical advice about side effects. You may report side effects to FDA at 1-800-FDA-1088. Where should I keep my medicine? This vaccine is only given by a health care provider. It will not be stored at home. NOTE: This sheet is a summary. It may not cover all possible information. If you have questions about this medicine, talk to your doctor, pharmacist, or health care provider.  2021 Elsevier/Gold Standard (2019-08-06 16:23:07)

## 2020-12-02 LAB — LIPID PANEL
Cholesterol: 166 mg/dL (ref <200)
HDL: 34 mg/dL — ABNORMAL LOW (ref >=40)
LDL Cholesterol (Calc): 101 mg/dL (calc) — ABNORMAL HIGH
Non-HDL Cholesterol (Calc): 132 mg/dL (calc) — ABNORMAL HIGH (ref <130)
Total CHOL/HDL Ratio: 4.9 (calc) (ref <5.0)
Triglycerides: 194 mg/dL — ABNORMAL HIGH (ref <150)

## 2020-12-02 LAB — CBC WITH DIFFERENTIAL/PLATELET
Absolute Monocytes: 765 cells/uL (ref 200–950)
Basophils Absolute: 54 cells/uL (ref 0–200)
Basophils Relative: 0.6 %
Eosinophils Absolute: 315 cells/uL (ref 15–500)
Eosinophils Relative: 3.5 %
HCT: 42.7 % (ref 38.5–50.0)
Hemoglobin: 14.4 g/dL (ref 13.2–17.1)
Lymphs Abs: 2007 cells/uL (ref 850–3900)
MCH: 29.6 pg (ref 27.0–33.0)
MCHC: 33.7 g/dL (ref 32.0–36.0)
MCV: 87.7 fL (ref 80.0–100.0)
MPV: 9.5 fL (ref 7.5–12.5)
Monocytes Relative: 8.5 %
Neutro Abs: 5859 cells/uL (ref 1500–7800)
Neutrophils Relative %: 65.1 %
Platelets: 262 10*3/uL (ref 140–400)
RBC: 4.87 10*6/uL (ref 4.20–5.80)
RDW: 13.6 % (ref 11.0–15.0)
Total Lymphocyte: 22.3 %
WBC: 9 10*3/uL (ref 3.8–10.8)

## 2020-12-02 LAB — COMPLETE METABOLIC PANEL WITH GFR
AG Ratio: 1.7 (calc) (ref 1.0–2.5)
ALT: 14 U/L (ref 9–46)
AST: 18 U/L (ref 10–35)
Albumin: 4.1 g/dL (ref 3.6–5.1)
Alkaline phosphatase (APISO): 88 U/L (ref 35–144)
BUN: 13 mg/dL (ref 7–25)
CO2: 27 mmol/L (ref 20–32)
Calcium: 9.3 mg/dL (ref 8.6–10.3)
Chloride: 104 mmol/L (ref 98–110)
Creat: 1.08 mg/dL (ref 0.70–1.33)
GFR, Est African American: 88 mL/min/{1.73_m2} (ref >=60)
GFR, Est Non African American: 76 mL/min/{1.73_m2} (ref >=60)
Globulin: 2.4 g/dL (calc) (ref 1.9–3.7)
Glucose, Bld: 89 mg/dL (ref 65–99)
Potassium: 4.3 mmol/L (ref 3.5–5.3)
Sodium: 139 mmol/L (ref 135–146)
Total Bilirubin: 0.4 mg/dL (ref 0.2–1.2)
Total Protein: 6.5 g/dL (ref 6.1–8.1)

## 2020-12-02 LAB — HEMOGLOBIN A1C
Hgb A1c MFr Bld: 6 % of total Hgb — ABNORMAL HIGH (ref <5.7)
Mean Plasma Glucose: 126 mg/dL
eAG (mmol/L): 7 mmol/L

## 2020-12-02 LAB — MAGNESIUM: Magnesium: 2 mg/dL (ref 1.5–2.5)

## 2020-12-02 LAB — TSH: TSH: 2.64 mIU/L (ref 0.40–4.50)

## 2020-12-12 ENCOUNTER — Encounter: Payer: Self-pay | Admitting: Medical Oncology

## 2020-12-15 ENCOUNTER — Encounter: Payer: Self-pay | Admitting: Medical Oncology

## 2020-12-19 ENCOUNTER — Encounter: Payer: Self-pay | Admitting: Medical Oncology

## 2020-12-19 NOTE — Progress Notes (Signed)
Left message requesting a return call to discuss referral to the [facility] 6/10.

## 2020-12-19 NOTE — Progress Notes (Signed)
Pt referred to the 6/10 PMDC. Packet of information mailed to patient.

## 2020-12-21 ENCOUNTER — Encounter: Payer: Self-pay | Admitting: Medical Oncology

## 2020-12-21 NOTE — Progress Notes (Signed)
Spoke with patient to remind him of appointment for PMDC 6/10, arriving @ 8 am. I reviewed registration, COVID protocol and reminded him to bring his completed medical forms. He voiced understanding.

## 2020-12-21 NOTE — Progress Notes (Signed)
GU Location of Tumor / Histology: prostatic adenocarcinoma  If Prostate Cancer, Gleason Score is (4 + 3) and PSA is (4.17). Prostate volume: 27.83  Jeannie Fend had two prior prostate biopsies. Third prostate biopsy prompted by rising PSA despite finasteride.Stopped finasteride in April 2022.  Biopsies of prostate (if applicable) revealed:   Past/Anticipated interventions by urology, if any: prostate biopsy x3, prescribed finasteride, referral to [facility]  Past/Anticipated interventions by medical oncology, if any: no  Weight changes, if any: denies  Bowel/Bladder complaints, if any: IPSS 12. SHIM 9. Denies dysuria, hematuria, urinary leakage or incontinence. Reports urinary urgency. Denies any bowel complaints.  Nausea/Vomiting, if any: denies  Pain issues, if any:  Low back pain.  SAFETY ISSUES: Prior radiation? no Pacemaker/ICD? no Possible current pregnancy? no, male patient Is the patient on methotrexate? no  Current Complaints / other details:  55 years old. Divorced. 2 daughters. Resides in [place].

## 2020-12-22 ENCOUNTER — Inpatient Hospital Stay: Payer: 59 | Attending: Oncology | Admitting: Oncology

## 2020-12-22 ENCOUNTER — Other Ambulatory Visit: Payer: Self-pay

## 2020-12-22 ENCOUNTER — Ambulatory Visit
Admission: RE | Admit: 2020-12-22 | Discharge: 2020-12-22 | Disposition: A | Discharge status: Home / Self Care | Payer: 59 | Source: Ambulatory Visit | Attending: Radiation Oncology | Admitting: Radiation Oncology

## 2020-12-22 ENCOUNTER — Encounter: Payer: Self-pay | Admitting: General Practice

## 2020-12-22 ENCOUNTER — Encounter: Payer: Self-pay | Admitting: Medical Oncology

## 2020-12-22 ENCOUNTER — Encounter: Payer: Self-pay | Admitting: Radiation Oncology

## 2020-12-22 VITALS — BP 134/87 | HR 63 | Temp 98.0°F | Resp 18 | Ht 74.0 in | Wt 274.8 lb

## 2020-12-22 DIAGNOSIS — M109 Gout, unspecified: Secondary | ICD-10-CM | POA: Insufficient documentation

## 2020-12-22 DIAGNOSIS — Z79899 Other long term (current) drug therapy: Secondary | ICD-10-CM | POA: Insufficient documentation

## 2020-12-22 DIAGNOSIS — E785 Hyperlipidemia, unspecified: Secondary | ICD-10-CM | POA: Diagnosis not present

## 2020-12-22 DIAGNOSIS — Z800 Family history of malignant neoplasm of digestive organs: Secondary | ICD-10-CM | POA: Insufficient documentation

## 2020-12-22 DIAGNOSIS — C61 Malignant neoplasm of prostate: Secondary | ICD-10-CM | POA: Insufficient documentation

## 2020-12-22 DIAGNOSIS — I10 Essential (primary) hypertension: Secondary | ICD-10-CM | POA: Insufficient documentation

## 2020-12-22 DIAGNOSIS — D649 Anemia, unspecified: Secondary | ICD-10-CM | POA: Diagnosis not present

## 2020-12-22 DIAGNOSIS — Z801 Family history of malignant neoplasm of trachea, bronchus and lung: Secondary | ICD-10-CM | POA: Insufficient documentation

## 2020-12-22 DIAGNOSIS — Z8042 Family history of malignant neoplasm of prostate: Secondary | ICD-10-CM | POA: Insufficient documentation

## 2020-12-22 DIAGNOSIS — Z803 Family history of malignant neoplasm of breast: Secondary | ICD-10-CM | POA: Insufficient documentation

## 2020-12-22 DIAGNOSIS — N529 Male erectile dysfunction, unspecified: Secondary | ICD-10-CM | POA: Diagnosis not present

## 2020-12-22 DIAGNOSIS — K219 Gastro-esophageal reflux disease without esophagitis: Secondary | ICD-10-CM | POA: Diagnosis not present

## 2020-12-22 HISTORY — DX: Malignant neoplasm of prostate: C61

## 2020-12-22 HISTORY — DX: Low back pain, unspecified: M54.50

## 2020-12-22 NOTE — Consult Note (Signed)
Multi-Disciplinary Clinic     12/22/2020   --------------------------------------------------------------------------------   [name]  MRN: [id number]  DOB: 04/12/1966, 55 year old Male  SSN: [id number]   PRIMARY CARE:  Unk Pinto, MD  REFERRING:    PROVIDER:  [name], M.D.  TREATING:  Raynelle Bring, M.D.  LOCATION:  [facility] [phone]     --------------------------------------------------------------------------------   CC/HPI: CC: Prostate Cancer   Physician requesting consult: Dr. [name]  PCP: Dr. Unk Pinto  Location of consult: [facility] - Prostate Cancer Multidisciplinary Clinic   [name] is a 55 year old healthy gentleman with a long standing history of an elevated PSA s/p two prior negative prostate biopsies in the past. He is on 2.5 mg of finasteride for hair loss and was noted to have an increase in his PSA from 2.84 in 2021 up to 4.17 in February 2022 prompting further evaluation with an MRI of the prostate on 10/05/20. This demonstrated a 1.6 cm PIRADS 3 lesion at the right anterior apex. An MR/US fusion biopsy was then performed on 11/22/20 with all 12 systematic cores negative for malignancy but 2 out of 3 targeted biopsies positive for malignancy. He was found to have Gleason 4+3=7 (GG 3) disease.   Family history: None.   Imaging studies:  MRI (10/05/20) No EPE, SVI, LAD, or bone lesions.   PMH: He has a history of gout and GERD. He is 271 lbs (BMI 35).  PSH: Laparoscopic cholecystectomy.   TNM stage: cT1c N0 Mx  PSA: 4.17 (on 5 ARI)  Gleason score: 4+3=7 (GG 3)  Biopsy (11/22/20): 2/15 cores positive  Left: Benign  Right: Benign  ROI: 2/3 cores positive (20% of one core of 4+3=7, 20% of another core of 3+4=7)  Prostate volume: 27.8 cc   Nomogram  OC disease: 61%  EPE: 34%  SVI: 4%  LNI: 7%  PFS (5 year, 10 year): 67%, 52%   Urinary function: IPSS is 11. His symptoms are mostly urinary  urgency and frequency. He gets up twice per night.  Erectile function: SHIM score is 9. This is mostly due to sexual inactivity recently. He is divorced and has not been sexually active is currently not in a relationship. Overall, he feels that he does have preserved function, however.     ALLERGIES: No Allergies    MEDICATIONS: Allopurinol  Finasteride  Advil TABS Oral PRN  Diazepam 10 mg tablet 1-2 tablet PO Daily Take one hour prior to procedure.  Heartburn Aid  Levofloxacin 750 mg tablet 1 po 1 hour prior to the procedure  Vitamin B12  Vitamin D3     GU PSH: Prostate Needle Biopsy - 11/22/2020, 2014 Vasectomy - 2011       [place] Notes: Colonoscopy (Fiberoptic), Biopsy Of The Prostate Needle, Surgery Of Male Genitalia Vasectomy, Cholecystectomy   NON-GU PSH: Cholecystectomy (open) - 2011 Diagnostic Colonoscopy - 2017 Surgical Pathology, Gross And Microscopic Examination For Prostate Needle - 11/22/2020     GU PMH: Elevated PSA - 11/22/2020, His PSA is up a bit more on the finasteride. I am going to get an MRIP and if positive get him set up for an MR fusion biopsy. If negative, he will return with a repeat PSA in 3 months and I will consider the ExoDx test since he is not keen on another biopsy. , - 09/13/2020, His PSA has been variable on the finasteride but it is up over the last year. I  will have him return with a repeat PSA in 3 months and if there is a further increase, I will repeat the MRI. , - 06/12/2020 (Worsening), His PSA is up from last year but he has reduced the dose of finasteride. I am going to repeat a level in 3 months and if it is stable I will have him return in a year from now. If it is still rising, I will consider a repeat MRI. , - 06/14/2019 (Stable), His PSA is stable on finasteride. Repeat in 1 year. , Oct 06, 2017, 10-06-16 (Improving), 2015-10-07, Elevated prostate specific antigen (PSA), - 2015/10/07 BPH w/LUTS, He hasn't really noticed much impact on voiding with the finasteride  and has had some sexual side effects. He is going to discuss possibly changing to a topical minoxidil/finasteride preparation for his hairloss issues. If he does that, the PSA level may rise from the med change. - 09/13/2020, He has stable LUTS with some urgency but not much bother. , - 06/12/2020, Benign prostatic hyperplasia with urinary obstruction, - Oct 07, 2015 Urinary Urgency - 09/13/2020, - 06/12/2020, Oct 06, 2016 Urinary Frequency, Increased urinary frequency - 2015-10-07      PMH Notes:  2009-11-27 14:48:40 - Note: Gout   NON-GU PMH: Hypertrophy of breast, He has mild tender gynecomastia and some ejaculatory side effects from the finasteride. I have recommended he stop that for now and if the symptoms resolve, he could go back with a lower dose such as 1mg  daily. 2017/10/06 Encounter for general adult medical examination without abnormal findings, Encounter for preventive health examination - 10/07/2015 Insomnia, unspecified, Insomnia - October 06, 2012 Personal history of other diseases of the digestive system, History of esophageal reflux - 06-Oct-2012    FAMILY HISTORY: Death In The Family Mother - Runs In Family Diabetes - Father Family Health Status - Father alive at age 37 - Mother Lung Cancer - Mother   SOCIAL HISTORY: Marital Status: Married Preferred Language: English; Ethnicity: Not Hispanic Or Latino; Race: White Current Smoking Status: Patient has never smoked.  Social Drinker.  Drinks 2 caffeinated drinks per day. Patient's occupation Company secretary.     Notes: Never A Smoker, Marital History - Currently Married, Alcohol Use, Caffeine Use, Occupation:   REVIEW OF SYSTEMS:    GU Review Male:   Patient denies frequent urination, hard to postpone urination, burning/ pain with urination, get up at night to urinate, leakage of urine, stream starts and stops, trouble starting your streams, and have to strain to urinate .  Gastrointestinal (Lower):   Patient denies diarrhea and constipation.   Gastrointestinal (Upper):   Patient denies nausea and vomiting.  Constitutional:   Patient denies fever, night sweats, weight loss, and fatigue.  Skin:   Patient denies skin rash/ lesion and itching.  Eyes:   Patient denies blurred vision and double vision.  Ears/ Nose/ Throat:   Patient denies sore throat and sinus problems.  Hematologic/Lymphatic:   Patient denies swollen glands and easy bruising.  Cardiovascular:   Patient denies leg swelling and chest pains.  Respiratory:   Patient denies cough and shortness of breath.  Endocrine:   Patient denies excessive thirst.  Musculoskeletal:   Patient denies back pain and joint pain.  Neurological:   Patient denies headaches and dizziness.  Psychologic:   Patient denies depression and anxiety.   VITAL SIGNS: None   MULTI-SYSTEM PHYSICAL EXAMINATION:    Constitutional: Well-nourished. No physical deformities. Normally developed. Good grooming.     Complexity of Data:  Lab Test Review:   PSA  Records Review:   Pathology Reports, Previous Patient Records   09/06/20 06/05/20 05/17/20 09/13/19 06/08/19 05/26/18 05/19/17 02/06/16  PSA  Total PSA 4.17 ng/mL 4.11 ng/mL 3.97 ng/ml 2.84 ng/mL 3.48 ng/mL 2.70 ng/mL 2.56 ng/mL 4.86   Free PSA 0.45 ng/mL 0.39 ng/mL      0.47   % Free PSA 11 % PSA 9 % PSA      10     PROCEDURES: None   ASSESSMENT:      ICD-10 Details  1 GU:   Prostate Cancer - C61    PLAN:           Document Letter(s):  Created for Patient: Clinical Summary         Notes:   1. Unfavorable intermediate risk prostate cancer: We had a detailed discussion regarding his prostate cancer situation. I did recommend therapy of curative intent considering his disease parameters and life expectancy. The patient was counseled about the natural history of prostate cancer and the standard treatment options that are available for prostate cancer. It was explained to him how his age and life expectancy, clinical stage, Gleason score, and PSA  affect his prognosis, the decision to proceed with additional staging studies, as well as how that information influences recommended treatment strategies. We discussed the roles for active surveillance, radiation therapy, surgical therapy, androgen deprivation, as well as ablative therapy options for the treatment of prostate cancer as appropriate to his individual cancer situation. We discussed the risks and benefits of these options with regard to their impact on cancer control and also in terms of potential adverse events, complications, and impact on quality of life particularly related to urinary and sexual function. The patient was encouraged to ask questions throughout the discussion today and all questions were answered to his stated satisfaction. In addition, the patient was provided with and/or directed to appropriate resources and literature for further education about prostate cancer and treatment options.   At this time, he plans to continue to consider his options. He is scheduled to meet with Dr. Tammi Klippel and Dr. Alen Blew later this morning. If he does elect to proceed with surgical therapy, I will have him return for further discussion and a physical exam although he can be set up in scheduled for surgery in advance if he likes.   Cc: Dr. Unk Pinto  Dr. Zola Button  Dr. Tyler Pita  Dr. [name]

## 2020-12-22 NOTE — Progress Notes (Signed)
Radiation Oncology         [phone] ________________________________  Multidisciplinary Prostate Cancer Clinic  Initial Radiation Oncology Consultation  Name: [name] MRN: [id number]  Date: 12/22/2020  DOB: 1966-01-16  CC:Unk Pinto, MD  [name], MD   REFERRING PHYSICIAN: [name], MD  DIAGNOSIS: 55 y.o. gentleman with stage T1c adenocarcinoma of the prostate with a Gleason's score of 4+3 and a PSA of 4.17 on finasteride    ICD-10-CM   1. Prostate cancer ([place])  C61       HISTORY OF PRESENT ILLNESS::[name] is a 55 y.o. gentleman. He has been followed by Dr. Jeffie Pollock since at least 2011 for an elevated PSA. He has undergone prostate biopsies in 01/2010 and 10/2012, both of which were benign. Surveillance MRI in 10/2015 was also negative. PSA in 01/2016 was 4.86. He was started on finasteride later that year for hair loss. Unfortunately, after a drop in his PSA to 2.56, his PSA rose again to 4.17 (8.34 adjusted for finasteride) in 08/2020. He underwent prostate MRI on 10/05/20 showing: 1.6 cm lesion of anterior fibromuscular stroma bilaterally and of right anterior transition zone at apex (PI-RADS 3); prostate volume of 23.71 cc. He stopped the finasteride in 10/2020. The patient proceeded to MRI fusion biopsy of the prostate on 11/22/20.  The prostate volume measured 27.83 cc by ultrasound.  Out of 15 core biopsies, 2 were positive.  The maximum Gleason score was 4+3, and this was seen in one ROI MRI lesion sample. Additionally, Gleason 3+4 was seen in a second ROI MRI lesion sample. All of the standard 12 cores were benign.    The patient reviewed the biopsy results with his urologist and he has kindly been referred today to the multidisciplinary prostate cancer clinic for presentation of pathology and radiology studies in our conference for discussion of potential radiation treatment options and clinical evaluation.  PREVIOUS RADIATION THERAPY: No  PAST MEDICAL  HISTORY:  has a past medical history of Allergy, Anemia, Anxiety, DDD (degenerative disc disease), GERD (gastroesophageal reflux disease), Gilbert's syndrome, Gout, Hyperlipidemia, Hypertension, Low back pain, Prostate cancer ([place]), and Tubular adenoma of colon (06/2015).    PAST SURGICAL HISTORY: Past Surgical History:  Procedure Laterality Date   CHOLECYSTECTOMY     CHOLESTEATOMA EXCISION     PROSTATE BIOPSY     prostrate     UPPER GASTROINTESTINAL ENDOSCOPY  2011   VASECTOMY     WISDOM TOOTH EXTRACTION Bilateral     FAMILY HISTORY: family history includes Breast cancer in an other family member; Diabetes in his father; Hypertension in his father; Lung cancer in his mother; Pancreatic cancer in an other family member; Prostate cancer in an other family member; Rectal cancer in an other family member.  SOCIAL HISTORY:  reports that he has never smoked. He has never used smokeless tobacco. He reports current alcohol use. He reports that he does not use drugs.  ALLERGIES: Patient has no known allergies.  MEDICATIONS:  Current Outpatient Medications  Medication Sig Dispense Refill   allopurinol (ZYLOPRIM) 300 MG tablet TAKE 1 TABLET DAILY TO PREVENT GOUT 90 tablet 1   cetirizine (ZYRTEC) 10 MG tablet Take 10 mg by mouth daily.     cholecalciferol (VITAMIN D) 1000 units tablet Take 10,000 Units by mouth daily.     Cyanocobalamin (B-12) 5000 MCG SUBL Place under the tongue.     FLOVENT HFA 220 MCG/ACT inhaler SWALLOW, DO NOT INHALE, 2 PUFFS TWICE A DAY. DO NOT EAT OR  DRINK FOR 30 MINUTES AFTER USE. RINSE MOUTH WELL AFTER USE. 12 each 0   MELATONIN GUMMIES PO Take by mouth.     Minoxidil (MINOXIDIL FOR MEN) 5 % FOAM Apply to area of thinning hair twice daily. Rinse hands and other skin areas after use. 60 g 1   pantoprazole (PROTONIX) 40 MG tablet Take 1 tablet (40 mg total) by mouth daily. 90 tablet 4   phentermine (ADIPEX-P) 37.5 MG tablet Take 1 tablet every morning 34min prior to first  meal, for dieting & weightloss 30 tablet 5   colchicine 0.6 MG tablet Take two tablets (1.2mg ) at onset of gout symptoms.  Then take one tablet one hour later x1. (Patient not taking: Reported on 12/22/2020) 30 tablet 2   No current facility-administered medications for this encounter.    REVIEW OF SYSTEMS:  On review of systems, the patient reports that he is doing well overall. He denies any chest pain, shortness of breath, cough, fevers, chills, night sweats, unintended weight changes. He denies any bowel disturbances, and denies abdominal pain, nausea or vomiting. He denies any new musculoskeletal or joint aches or pains. His IPSS was 12, indicating moderate urinary symptoms. His main complaint is urinary urgency. His SHIM was 9, indicating he has moderate erectile dysfunction. A complete review of systems is obtained and is otherwise negative.   PHYSICAL EXAM:  Wt Readings from Last 3 Encounters:  12/22/20 274 lb 12.8 oz (124.6 kg)  12/01/20 271 lb (122.9 kg)  10/20/20 273 lb (123.8 kg)   Temp Readings from Last 3 Encounters:  12/22/20 98 F (36.7 C)  12/01/20 97.7 F (36.5 C)  10/20/20 (!) 97 F (36.1 C) (Skin)   BP Readings from Last 3 Encounters:  12/22/20 134/87  12/01/20 132/78  10/20/20 122/75   Pulse Readings from Last 3 Encounters:  12/22/20 63  12/01/20 80  10/20/20 66   Pain Assessment Pain Score:  (chronic back pain denies new pain)/10  In general this is a well appearing man in no acute distress. He's alert and oriented x4 and appropriate throughout the examination. Cardiopulmonary assessment is negative for acute distress and he exhibits normal effort.    KPS = 100  100 - Normal; no complaints; no evidence of disease. 90   - Able to carry on normal activity; minor signs or symptoms of disease. 80   - Normal activity with effort; some signs or symptoms of disease. 61   - Cares for self; unable to carry on normal activity or to do active work. 60   - Requires  occasional assistance, but is able to care for most of his personal needs. 50   - Requires considerable assistance and frequent medical care. 36   - Disabled; requires special care and assistance. 44   - Severely disabled; hospital admission is indicated although death not imminent. 37   - Very sick; hospital admission necessary; active supportive treatment necessary. 10   - Moribund; fatal processes progressing rapidly. 0     - Dead  Karnofsky DA, Abelmann [place], Craver LS and [facility] [phone]) The use of the nitrogen mustards in the palliative treatment of carcinoma: with particular reference to bronchogenic carcinoma Cancer 1 634-56   LABORATORY DATA:  Lab Results  Component Value Date   WBC 9.0 12/01/2020   HGB 14.4 12/01/2020   HCT 42.7 12/01/2020   MCV 87.7 12/01/2020   PLT 262 12/01/2020   Lab Results  Component Value Date   NA 139 12/01/2020  K 4.3 12/01/2020   CL 104 12/01/2020   CO2 27 12/01/2020   Lab Results  Component Value Date   ALT 14 12/01/2020   AST 18 12/01/2020   ALKPHOS 69 03/06/2016   BILITOT 0.4 12/01/2020     RADIOGRAPHY: No results found.    IMPRESSION/PLAN: 55 y.o. gentleman with Stage T1c adenocarcinoma of the prostate with a Gleason score of 4+3 and a PSA of 4.17 on finasteride.    We discussed the patient's workup and outlined the nature of prostate cancer in this setting. The patient's T stage, Gleason's score, and PSA put him into the intermediate risk group. Accordingly, he is eligible for a variety of potential treatment options including brachytherapy, 5.5 weeks of external radiation, or prostatectomy. We discussed the available radiation techniques, and focused on the details and logistics of delivery. We discussed and outlined the risks, benefits, short and long-term effects associated with radiotherapy and compared and contrasted these with prostatectomy. We discussed the role of SpaceOAR gel in reducing the rectal toxicity associated with  radiotherapy.   The patient focused most of his questions and interest in robotic-assisted laparoscopic radical prostatectomy.  We discussed some of the potential advantages of surgery including surgical staging, the availability of salvage radiotherapy to the prostatic fossa, and the confidence associated with immediate biochemical response. We discussed some of the potential proven indications for postoperative radiotherapy including positive margins, extracapsular extension, and seminal vesicle involvement. We also talked about some of the other potential findings leading to a recommendation for radiotherapy including a non-zero postoperative PSA and positive lymph nodes.   He appears to have a good understanding of his disease and our treatment recommendations which are of curative intent.  He was encouraged to ask questions that were answered to his stated satisfaction.  At the end of the conversation the patient remains undecided but appears to be leaning towards prostatectomy given his young age. I will share our discussion with Dr. Alinda Money. I look forward to following along in his care and participating as needed.  I personally spent 60 minutes in this encounter including chart review, reviewing radiological studies, meeting face-to-face with the patient, entering orders and completing documentation.  ------------------------------------------------   Tyler Pita, MD [place]: [phone]  Fax: [phone] [email or web address]  Skype  [email or web address]   This document serves as a record of services personally performed by Tyler Pita, MD. It was created on his behalf by Wilburn Mylar, a trained medical scribe. The creation of this record is based on the scribe's personal observations and the provider's statements to them. This document has been checked and approved by the attending provider.

## 2020-12-22 NOTE — Progress Notes (Signed)
Reason for the request:    Prostate cancer  HPI: I was asked by Dr. Jeffie Pollock to evaluate [name] for the evaluation of prostate cancer.  He is a 55 year old man without any significant comorbid conditions was found to have an elevated PSA and has been followed by Dr. Jeffie Pollock.  His PSA in 2017 was 4.86 and has been fluctuating since that time.  In November 2021 was 4.11 and in February 2022 was 4.17.  Previous prostate biopsy has been negative.  He underwent an MRI given his recent rise in his PSA in March 2022.  He MRI showed a PI-RADS category 3 lesion in the anterior portion of the prostate.  Based on these findings he underwent a targeted biopsy which showed a Gleason score 4+3 equal 7 as well as a 3+4 equal 7 in the target biopsy.  No other cancer detected in 12 out of 12 cores in the random prostate biopsy results.  Based on these findings, he was referred for evaluation at the prostate multidisciplinary clinic.  Clinically he does report urinary symptoms including some nocturia, frequency and urgency.  He does have some element of erectile dysfunction on finasteride.  He has discontinued finasteride however since April 2022.  He remains active and continues to work full-time without any significant comorbid conditions.  He does not report any headaches, blurry vision, syncope or seizures. Does not report any fevers, chills or sweats.  Does not report any cough, wheezing or hemoptysis.  Does not report any chest pain, palpitation, orthopnea or leg edema.  Does not report any nausea, vomiting or abdominal pain.  Does not report any constipation or diarrhea.  Does not report any skeletal complaints.    Does not report frequency, urgency or hematuria.  Does not report any skin rashes or lesions. Does not report any heat or cold intolerance.  Does not report any lymphadenopathy or petechiae.  Does not report any anxiety or depression.  Remaining review of systems is negative.     Past Medical History:   Diagnosis Date   Allergy    Anemia    Anxiety    DDD (degenerative disc disease)    GERD (gastroesophageal reflux disease)    Gilbert's syndrome    pt denies dx   Gout    Hyperlipidemia    Hypertension    Low back pain    Prostate cancer ([place])    Tubular adenoma of colon 06/2015  :   Past Surgical History:  Procedure Laterality Date   CHOLECYSTECTOMY     CHOLESTEATOMA EXCISION     PROSTATE BIOPSY     prostrate     UPPER GASTROINTESTINAL ENDOSCOPY  2011   VASECTOMY     WISDOM TOOTH EXTRACTION Bilateral   :   Current Outpatient Medications:    allopurinol (ZYLOPRIM) 300 MG tablet, TAKE 1 TABLET DAILY TO PREVENT GOUT, Disp: 90 tablet, Rfl: 1   cetirizine (ZYRTEC) 10 MG tablet, Take 10 mg by mouth daily., Disp: , Rfl:    cholecalciferol (VITAMIN D) 1000 units tablet, Take 10,000 Units by mouth daily., Disp: , Rfl:    colchicine 0.6 MG tablet, Take two tablets (1.2mg ) at onset of gout symptoms.  Then take one tablet one hour later x1. (Patient not taking: Reported on 12/22/2020), Disp: 30 tablet, Rfl: 2   Cyanocobalamin (B-12) 5000 MCG SUBL, Place under the tongue., Disp: , Rfl:    FLOVENT HFA 220 MCG/ACT inhaler, SWALLOW, DO NOT INHALE, 2 PUFFS TWICE A DAY. DO NOT  EAT OR DRINK FOR 30 MINUTES AFTER USE. RINSE MOUTH WELL AFTER USE., Disp: 12 each, Rfl: 0   MELATONIN GUMMIES PO, Take by mouth., Disp: , Rfl:    Minoxidil (MINOXIDIL FOR MEN) 5 % FOAM, Apply to area of thinning hair twice daily. Rinse hands and other skin areas after use., Disp: 60 g, Rfl: 1   pantoprazole (PROTONIX) 40 MG tablet, Take 1 tablet (40 mg total) by mouth daily., Disp: 90 tablet, Rfl: 4   phentermine (ADIPEX-P) 37.5 MG tablet, Take 1 tablet every morning 15min prior to first meal, for dieting & weightloss, Disp: 30 tablet, Rfl: 5:  No Known Allergies:   Family History  Problem Relation Age of Onset   Lung cancer Mother    Diabetes Father    Hypertension Father    Breast cancer Other    Rectal  cancer Other    Prostate cancer Other    Pancreatic cancer Other    Colon cancer Neg Hx    Esophageal cancer Neg Hx   :   Social History   Socioeconomic History   Marital status: Divorced    Spouse name: Not on file   Number of children: 2   Years of education: Not on file   Highest education level: Not on file  Occupational History    Employer: [employer]   Occupation: Pharmacist, hospital: [employer]  Tobacco Use   Smoking status: Never   Smokeless tobacco: Never  Vaping Use   Vaping Use: Never used  Substance and Sexual Activity   Alcohol use: Yes    Alcohol/week: 0.0 standard drinks    Comment: social   Drug use: No   Sexual activity: Not Currently  Other Topics Concern   Not on file  Social History Narrative   1 son and 1 daughter.   Social Determinants of Health   Financial Resource Strain: Not on file  Food Insecurity: Not on file  Transportation Needs: Not on file  Physical Activity: Not on file  Stress: Not on file  Social Connections: Not on file  Intimate Partner Violence: Not on file  :  Pertinent items are noted in HPI.  Exam: ECOG 0 General appearance: alert and cooperative appeared without distress. Head: atraumatic without any abnormalities. Eyes: conjunctivae/corneas clear. PERRL.  Sclera anicteric. Throat: lips, mucosa, and tongue normal; without oral thrush or ulcers. Resp: clear to auscultation bilaterally without rhonchi, wheezes or dullness to percussion. Cardio: regular rate and rhythm, S1, S2 normal, no murmur, click, rub or gallop GI: soft, non-tender; bowel sounds normal; no masses,  no organomegaly Skin: Skin color, texture, turgor normal. No rashes or lesions Lymph nodes: Cervical, supraclavicular, and axillary nodes normal. Neurologic: Grossly normal without any motor, sensory or deep tendon reflexes. Musculoskeletal: No joint deformity or effusion.    Assessment and Plan:   54 year old man with prostate cancer  diagnosed and May 2022.  He has a Gleason score of 4+3 equal 7 based on a targeted biopsy with a PSA 4.17. His case was discussed today in the prostate cancer multidisciplinary clinic including discussion with radiology about his MRI, reviewing his pathology results with pathology as well as discussion with Dr. Tammi Klippel from radiation oncology and Dr. Alinda Money from urology regarding surgical options.  Risks and benefits of all these treatment options were reviewed today with the patient as well as weighing and his current urinary and erectile dysfunction symptoms.  Oncological outcomes were reviewed with him as well as the role of systemic  therapy in this particular setting.  Given his low volume intermediate risk disease the role of systemic therapy is limited at this time and treatment options including primary surgical therapy versus radiation were debated.  Primary surgical therapy might offer more definitive approach given his already baseline urinary symptoms.  He will evaluate these options and make a decision in the near future.  All his questions were answered to his satisfaction.   45  minutes were dedicated to this visit. The time was spent on reviewing laboratory data, imaging studies, discussing treatment options, and answering questions regarding future plan.      A copy of this consult has been forwarded to the requesting physician.

## 2020-12-22 NOTE — Progress Notes (Signed)
[place] Psychosocial Distress Screening Spiritual Care  Met with [name] in [facility] to introduce [facility] team/resources, reviewing distress screen per protocol.  The patient scored a 3 on the Psychosocial Distress Thermometer which indicates mild distress. Also assessed for distress and other psychosocial needs.   ONCBCN DISTRESS SCREENING 12/22/2020  Screening Type Initial Screening  Distress experienced in past week (1-10) 3  Emotional problem type Adjusting to illness  Referral to financial advocate Yes   [name] reports low distress. Encouraged Prostate Cancer Support Group, as well as other [facility]) programming, as a means for support, encouragement, information-sharing, and community.  Follow up needed: No. [name] prefers to reach out as needed.   [place], [place], [facility] Pager [phone] Voicemail [phone]

## 2020-12-22 NOTE — Progress Notes (Signed)
                               Care Plan Summary  Name: [name] DOB: 1965-10-18   Your Medical Team:   Urologist -  Dr. Raynelle Bring, [facility]  Radiation Oncologist - Dr. Tyler Pita, [facility]   Medical Oncologist - Dr. Zola Button, [place]  Recommendations: 1) Robotic prostatectomy  2) Radiation   * These recommendations are based on information available as of today's consult.      Recommendations may change depending on the results of further tests or exams.   Next Steps: 1) Consider your options and call [name], RN    When appointments need to be scheduled, you will be contacted by [facility] and/or [facility].  Questions?  Please do not hesitate to call [name], RN, BSN, OCN at [phone] any questions or concerns.  Shirlean Mylar is your Oncology Nurse Navigator and is available to assist you while you're receiving your medical care at [facility].

## 2020-12-26 ENCOUNTER — Ambulatory Visit: Payer: 59 | Admitting: Gastroenterology

## 2020-12-26 ENCOUNTER — Encounter: Payer: Self-pay | Admitting: Gastroenterology

## 2020-12-26 VITALS — BP 100/70 | HR 76 | Ht 74.0 in | Wt 270.4 lb

## 2020-12-26 DIAGNOSIS — K222 Esophageal obstruction: Secondary | ICD-10-CM

## 2020-12-26 DIAGNOSIS — K200 Eosinophilic esophagitis: Secondary | ICD-10-CM

## 2020-12-26 NOTE — Patient Instructions (Signed)
Stay on pantoprazole and Flovent.   Follow up with Dr. [name] in 3 months.   Normal BMI (Body Mass Index- based on height and weight) is between 19 and 25. Your BMI today is Body mass index is 34.72 kg/m. Marland Kitchen Please consider follow up  regarding your BMI with your Primary Care Provider.  Thank you for choosing me and [facility].  [name]. Dagoberto Ligas., MD., Marval Regal

## 2020-12-26 NOTE — Progress Notes (Signed)
    History of Present Illness: This is a 55 year old male returning for follow-up of eosinophilic esophagitis.  Pantoprazole was continued and Flovent was started in late April.  His dysphagia resolved after EGD with dilation as below.  He has no gastrointestinal complaints today.  EGD 10/2020 - Benign-appearing esophageal stenoses. Dilated. - Esophageal mucosal changes suggestive of eosinophilic esophagitis. Biopsied. - Erythematous mucosa in the gastric body. Biopsied. - Erosive duodenopathy without bleeding. Path results: chronic gastritis and eosinophilic esophagitis  Current Medications, Allergies, Past Medical History, Past Surgical History, Family History and Social History were reviewed in [employer] record.   Physical Exam: General: Well developed, well nourished, no acute distress Head: Normocephalic and atraumatic Eyes: Sclerae anicteric, EOMI Ears: Normal auditory acuity Mouth: Not examined, mask on during Covid-19 pandemic Lungs: Clear throughout to auscultation Heart: Regular rate and rhythm; no murmurs, rubs or bruits Abdomen: Soft, non tender and non distended. No masses, hepatosplenomegaly or hernias noted. Normal Bowel sounds Rectal: Not done Musculoskeletal: Symmetrical with no gross deformities  Pulses:  Normal pulses noted Extremities: No clubbing, cyanosis, edema or deformities noted Neurological: Alert oriented x 4, grossly nonfocal Psychological:  Alert and cooperative. Normal mood and affect   Assessment and Recommendations:  Eosinophilic esophagitis and GERD.  Dysphagia resolved.  Continue pantoprazole 40 mg p.o. daily.  Continue Flovent 220 mcg 2 puffs swallowed twice daily.  If he continues to do well we will consider discontinuing Flovent at his return office visit. REV in 3 months.  2.  Personal history of 2 small adenomatous colon polyps in 2016.  Plan for a 7-year interval surveillance colonoscopy in December 2023.

## 2020-12-29 ENCOUNTER — Encounter: Payer: Self-pay | Admitting: Medical Oncology

## 2020-12-29 ENCOUNTER — Other Ambulatory Visit: Payer: Self-pay | Admitting: Adult Health

## 2020-12-29 DIAGNOSIS — K200 Eosinophilic esophagitis: Secondary | ICD-10-CM

## 2020-12-29 NOTE — Progress Notes (Signed)
Patient has decided on robotic prostatectomy as treatment for his prostate cancer. Dr. Alinda Money, Dr. Tammi Klippel and [place], [place] notified of his decision.

## 2021-01-09 ENCOUNTER — Other Ambulatory Visit: Payer: Self-pay | Admitting: Urology

## 2021-01-12 HISTORY — PX: OTHER SURGICAL HISTORY: SHX169

## 2021-01-22 NOTE — Patient Instructions (Addendum)
DUE TO COVID-19 ONLY ONE VISITOR IS ALLOWED TO COME WITH YOU AND STAY IN THE WAITING ROOM ONLY DURING PRE OP AND PROCEDURE DAY OF SURGERY. THE 1 VISITOR  MAY VISIT WITH YOU AFTER SURGERY IN YOUR PRIVATE ROOM DURING VISITING HOURS ONLY!  YOU NEED TO HAVE A COVID 19 TEST ON: 01/25/21 @ 9:00 AM , THIS TEST MUST BE DONE BEFORE SURGERY,  COVID TESTING SITE [place] [address], IT IS ON THE RIGHT GOING OUT [address] APPROXIMATELY  2 MINUTES PAST [employer] ON THE RIGHT. ONCE YOUR COVID TEST IS COMPLETED,  PLEASE BEGIN THE QUARANTINE INSTRUCTIONS AS OUTLINED IN YOUR HANDOUT.               [name]   Your procedure is scheduled on: 01/29/21   Report to [facility] Main  Entrance   Report to short stay at: 5:15 AM     Call this number if you have problems the morning of surgery [phone]    Remember: Do not eat food or drink liquids :After Midnight.   BRUSH YOUR TEETH MORNING OF SURGERY AND RINSE YOUR MOUTH OUT, NO CHEWING GUM CANDY OR MINTS.    Take these medicines the morning of surgery with A SIP OF WATER: cetirizine,allopurinol,pantoprazole.Use inhalers as usual.Colchicine as needed.                               You may not have any metal on your body including hair pins and              piercings  Do not wear jewelry, lotions, powders or perfumes, deodorant             Men may shave face and neck.   Do not bring valuables to the hospital. [place].  Contacts, dentures or bridgework may not be worn into surgery.  Leave suitcase in the car. After surgery it may be brought to your room.     Patients discharged the day of surgery will not be allowed to drive home. IF YOU ARE HAVING SURGERY AND GOING HOME THE SAME DAY, YOU MUST HAVE AN ADULT TO DRIVE YOU HOME AND BE WITH YOU FOR 24 HOURS. YOU MAY GO HOME BY TAXI OR UBER OR ORTHERWISE, BUT AN ADULT MUST ACCOMPANY YOU HOME AND STAY WITH YOU  FOR 24 HOURS.  Name and phone number of your driver:  Special Instructions: N/A              Please read over the following fact sheets you were given: _____________________________________________________________________           [facility] - Preparing for Surgery Before surgery, you can play an important role.  Because skin is not sterile, your skin needs to be as free of germs as possible.  You can reduce the number of germs on your skin by washing with CHG (chlorahexidine gluconate) soap before surgery.  CHG is an antiseptic cleaner which kills germs and bonds with the skin to continue killing germs even after washing. Please DO NOT use if you have an allergy to CHG or antibacterial soaps.  If your skin becomes reddened/irritated stop using the CHG and inform your nurse when you arrive at Short Stay. Do not shave (including legs and underarms) for at least 48  hours prior to the first CHG shower.  You may shave your face/neck. Please follow these instructions carefully:  1.  Shower with CHG Soap the night before surgery and the  morning of Surgery.  2.  If you choose to wash your hair, wash your hair first as usual with your  normal  shampoo.  3.  After you shampoo, rinse your hair and body thoroughly to remove the  shampoo.                           4.  Use CHG as you would any other liquid soap.  You can apply chg directly  to the skin and wash                       Gently with a scrungie or clean washcloth.  5.  Apply the CHG Soap to your body ONLY FROM THE NECK DOWN.   Do not use on face/ open                           Wound or open sores. Avoid contact with eyes, ears mouth and genitals (private parts).                       Wash face,  Genitals (private parts) with your normal soap.             6.  Wash thoroughly, paying special attention to the area where your surgery  will be performed.  7.  Thoroughly rinse your body with warm water from the neck down.  8.  DO NOT shower/wash with  your normal soap after using and rinsing off  the CHG Soap.                9.  Pat yourself dry with a clean towel.            10.  Wear clean pajamas.            11.  Place clean sheets on your bed the night of your first shower and do not  sleep with pets. Day of Surgery : Do not apply any lotions/deodorants the morning of surgery.  Please wear clean clothes to the hospital/surgery center.  FAILURE TO FOLLOW THESE INSTRUCTIONS MAY RESULT IN THE CANCELLATION OF YOUR SURGERY PATIENT SIGNATURE_________________________________  NURSE SIGNATURE__________________________________  ________________________________________________________________________   [name]  An incentive spirometer is a tool that can help keep your lungs clear and active. This tool measures how well you are filling your lungs with each breath. Taking long deep breaths may help reverse or decrease the chance of developing breathing (pulmonary) problems (especially infection) following: A long period of time when you are unable to move or be active. BEFORE THE PROCEDURE  If the spirometer includes an indicator to show your best effort, your nurse or respiratory therapist will set it to a desired goal. If possible, sit up straight or lean slightly forward. Try not to slouch. Hold the incentive spirometer in an upright position. INSTRUCTIONS FOR USE  Sit on the edge of your bed if possible, or sit up as far as you can in bed or on a chair. Hold the incentive spirometer in an upright position. Breathe out normally. Place the mouthpiece in your mouth and seal your lips tightly around it. Breathe in slowly and as deeply as possible, raising the  piston or the ball toward the top of the column. Hold your breath for 3-5 seconds or for as long as possible. Allow the piston or ball to fall to the bottom of the column. Remove the mouthpiece from your mouth and breathe out normally. Rest for a few seconds and repeat  Steps 1 through 7 at least 10 times every 1-2 hours when you are awake. Take your time and take a few normal breaths between deep breaths. The spirometer may include an indicator to show your best effort. Use the indicator as a goal to work toward during each repetition. After each set of 10 deep breaths, practice coughing to be sure your lungs are clear. If you have an incision (the cut made at the time of surgery), support your incision when coughing by placing a pillow or rolled up towels firmly against it. Once you are able to get out of bed, walk around indoors and cough well. You may stop using the incentive spirometer when instructed by your caregiver.  RISKS AND COMPLICATIONS Take your time so you do not get dizzy or light-headed. If you are in pain, you may need to take or ask for pain medication before doing incentive spirometry. It is harder to take a deep breath if you are having pain. AFTER USE Rest and breathe slowly and easily. It can be helpful to keep track of a log of your progress. Your caregiver can provide you with a simple table to help with this. If you are using the spirometer at home, follow these instructions: [place] IF:  You are having difficultly using the spirometer. You have trouble using the spirometer as often as instructed. Your pain medication is not giving enough relief while using the spirometer. You develop fever of 100.5 F (38.1 C) or higher. SEEK IMMEDIATE MEDICAL CARE IF:  You cough up bloody sputum that had not been present before. You develop fever of 102 F (38.9 C) or greater. You develop worsening pain at or near the incision site. MAKE SURE YOU:  Understand these instructions. Will watch your condition. Will get help right away if you are not doing well or get worse. Document Released: 11/11/2006 Document Revised: 09/23/2011 Document Reviewed: 01/12/2007 [facility] Patient Information 2014 [place],  [place].   ________________________________________________________________________

## 2021-01-24 ENCOUNTER — Other Ambulatory Visit: Payer: Self-pay

## 2021-01-24 ENCOUNTER — Encounter (HOSPITAL_COMMUNITY)
Admission: RE | Admit: 2021-01-24 | Discharge: 2021-01-24 | Disposition: A | Discharge status: Home / Self Care | Payer: 59 | Source: Ambulatory Visit | Attending: Urology | Admitting: Urology

## 2021-01-24 ENCOUNTER — Encounter (HOSPITAL_COMMUNITY): Payer: Self-pay

## 2021-01-24 DIAGNOSIS — Z01812 Encounter for preprocedural laboratory examination: Secondary | ICD-10-CM | POA: Diagnosis present

## 2021-01-24 LAB — BASIC METABOLIC PANEL
Anion gap: 7 (ref 5–15)
BUN: 15 mg/dL (ref 6–20)
CO2: 26 mmol/L (ref 22–32)
Calcium: 9.1 mg/dL (ref 8.9–10.3)
Chloride: 104 mmol/L (ref 98–111)
Creatinine, Ser: 1.04 mg/dL (ref 0.61–1.24)
GFR, Estimated: >60 mL/min (ref >60)
Glucose, Bld: 117 mg/dL — ABNORMAL HIGH (ref 70–99)
Potassium: 3.7 mmol/L (ref 3.5–5.1)
Sodium: 137 mmol/L (ref 135–145)

## 2021-01-24 LAB — CBC
HCT: 43.2 % (ref 39.0–52.0)
Hemoglobin: 14.1 g/dL (ref 13.0–17.0)
MCH: 28.8 pg (ref 26.0–34.0)
MCHC: 32.6 g/dL (ref 30.0–36.0)
MCV: 88.2 fL (ref 80.0–100.0)
Platelets: 251 10*3/uL (ref 150–400)
RBC: 4.9 MIL/uL (ref 4.22–5.81)
RDW: 14 % (ref 11.5–15.5)
WBC: 9.3 10*3/uL (ref 4.0–10.5)
nRBC: 0 % (ref 0.0–0.2)

## 2021-01-24 NOTE — Progress Notes (Addendum)
COVID Vaccine Completed: Yes Date COVID Vaccine completed: 11/03/19 COVID vaccine manufacturer:    Moderna     PCP - Dr. Unk Pinto Cardiologist -   Chest x-ray -  EKG - 05/17/20 EPIC Stress Test -  ECHO -  Cardiac Cath -  Pacemaker/ICD device last checked: A1C: 6.0: 12/01/20 Sleep Study -  CPAP -   Fasting Blood Sugar -  Checks Blood Sugar _____ times a day  Blood Thinner Instructions: Aspirin Instructions: Last Dose:  Anesthesia review:   Patient denies shortness of breath, fever, cough and chest pain at PAT appointment   Patient verbalized understanding of instructions that were given to them at the PAT appointment. Patient was also instructed that they will need to review over the PAT instructions again at home before surgery.

## 2021-01-25 ENCOUNTER — Other Ambulatory Visit (HOSPITAL_COMMUNITY)
Admission: RE | Admit: 2021-01-25 | Discharge: 2021-01-25 | Disposition: A | Discharge status: Home / Self Care | Payer: 59 | Source: Ambulatory Visit | Attending: Urology | Admitting: Urology

## 2021-01-25 DIAGNOSIS — Z01812 Encounter for preprocedural laboratory examination: Secondary | ICD-10-CM | POA: Insufficient documentation

## 2021-01-25 DIAGNOSIS — Z20822 Contact with and (suspected) exposure to covid-19: Secondary | ICD-10-CM | POA: Insufficient documentation

## 2021-01-25 LAB — SARS CORONAVIRUS 2 (TAT 6-24 HRS): SARS Coronavirus 2: NEGATIVE

## 2021-01-26 ENCOUNTER — Encounter (HOSPITAL_COMMUNITY): Payer: Self-pay | Admitting: Urology

## 2021-01-26 NOTE — H&P (Signed)
Office Visit Report     01/19/2021   --------------------------------------------------------------------------------   [name]  MRN: [id number]  DOB: 05-24-1966, 55 year old Male  SSN: [id number]   PRIMARY CARE:  Unk Pinto, MD  REFERRING:    PROVIDER:  [name], M.D.  TREATING:  Raynelle Bring, M.D.  LOCATION:  [facility] [phone]     --------------------------------------------------------------------------------   CC/HPI: CC: Prostate Cancer   PCP: Dr. Unk Pinto   [name] is a 55 year old healthy gentleman with a long standing history of an elevated PSA s/p two prior negative prostate biopsies in the past. He is on 2.5 mg of finasteride for hair loss and was noted to have an increase in his PSA from 2.84 in 2021 up to 4.17 in February 2022 prompting further evaluation with an MRI of the prostate on 10/05/20. This demonstrated a 1.6 cm PIRADS 3 lesion at the right anterior apex. An MR/US fusion biopsy was then performed on 11/22/20 with all 12 systematic cores negative for malignancy but 2 out of 3 targeted biopsies positive for malignancy. He was found to have Gleason 4+3=7 (GG 3) disease.   After discussion in the multidisciplinary clinic, he has now made the decision to proceed with surgical therapy and is here today to discuss that further.   Family history: None.   Imaging studies:  MRI (10/05/20) No EPE, SVI, LAD, or bone lesions.   PMH: He has a history of gout and GERD. He is 271 lbs (BMI 35).  PSH: Laparoscopic cholecystectomy.   TNM stage: cT1c N0 Mx  PSA: 4.17 (on 5 ARI)  Gleason score: 4+3=7 (GG 3)  Biopsy (11/22/20): 2/15 cores positive  Left: Benign  Right: Benign  ROI: 2/3 cores positive (20% of one core of 4+3=7, 20% of another core of 3+4=7)  Prostate volume: 27.8 cc   Nomogram  OC disease: 61%  EPE: 34%  SVI: 4%  LNI: 7%  PFS (5 year, 10 year): 67%, 52%   Urinary function: IPSS is 11. His symptoms are  mostly urinary urgency and frequency. He gets up twice per night.  Erectile function: SHIM score is 9. This is mostly due to sexual inactivity recently. He is divorced and has not been sexually active is currently not in a relationship. Overall, he feels that he does have preserved function, however.     ALLERGIES: No Allergies    MEDICATIONS: Allopurinol  Finasteride  Advil TABS Oral PRN  Diazepam 10 mg tablet 1-2 tablet PO Daily Take one hour prior to procedure.  Heartburn Aid  Levofloxacin 750 mg tablet 1 po 1 hour prior to the procedure  Vitamin B12  Vitamin D3     GU PSH: Prostate Needle Biopsy - 11/22/2020, 2014 Vasectomy - 2011       [place] Notes: Colonoscopy (Fiberoptic), Biopsy Of The Prostate Needle, Surgery Of Male Genitalia Vasectomy, Cholecystectomy   NON-GU PSH: Cholecystectomy (open) - 2011 Diagnostic Colonoscopy - 2017 Surgical Pathology, Gross And Microscopic Examination For Prostate Needle - 11/22/2020     GU PMH: Urinary Urgency - 01/11/2021, - 09/13/2020, - 06/12/2020, - 2018 Prostate Cancer - 12/22/2020 Elevated PSA - 11/22/2020, His PSA is up a bit more on the finasteride. I am going to get an MRIP and if positive get him set up for an MR fusion biopsy. If negative, he will return with a repeat PSA in 3 months and I will consider the ExoDx test since he is not keen on another  biopsy. , - 09/13/2020, His PSA has been variable on the finasteride but it is up over the last year. I will have him return with a repeat PSA in 3 months and if there is a further increase, I will repeat the MRI. , - 06/12/2020 (Worsening), His PSA is up from last year but he has reduced the dose of finasteride. I am going to repeat a level in 3 months and if it is stable I will have him return in a year from now. If it is still rising, I will consider a repeat MRI. , - 06/14/2019 (Stable), His PSA is stable on finasteride. Repeat in 1 year. , 2017/10/23, 10/23/16 (Improving), 10-24-2015, Elevated prostate  specific antigen (PSA), - 2015-10-24 BPH w/LUTS, He hasn't really noticed much impact on voiding with the finasteride and has had some sexual side effects. He is going to discuss possibly changing to a topical minoxidil/finasteride preparation for his hairloss issues. If he does that, the PSA level may rise from the med change. - 09/13/2020, He has stable LUTS with some urgency but not much bother. , - 06/12/2020, Benign prostatic hyperplasia with urinary obstruction, - October 24, 2015 Urinary Frequency, Increased urinary frequency - 10/24/15      PMH Notes:  2009-11-27 14:48:40 - Note: Gout   NON-GU PMH: Muscle weakness (generalized) - 01/11/2021 Other muscle spasm - 01/11/2021 Other specified disorders of muscle - 01/11/2021 Hypertrophy of breast, He has mild tender gynecomastia and some ejaculatory side effects from the finasteride. I have recommended he stop that for now and if the symptoms resolve, he could go back with a lower dose such as 1mg  daily. 10/23/2017 Encounter for general adult medical examination without abnormal findings, Encounter for preventive health examination - 10-24-2015 Insomnia, unspecified, Insomnia - 10-23-12 Personal history of other diseases of the digestive system, History of esophageal reflux - 2012/10/23    FAMILY HISTORY: Death In The Family Mother - Runs In Family Diabetes - Father Family Health Status - Father alive at age 60 - Mother Lung Cancer - Mother   SOCIAL HISTORY: Marital Status: Married Preferred Language: English; Ethnicity: Not Hispanic Or Latino; Race: White Current Smoking Status: Patient has never smoked.  Social Drinker.  Drinks 2 caffeinated drinks per day. Patient's occupation Company secretary.     Notes: Never A Smoker, Marital History - Currently Married, Alcohol Use, Caffeine Use, Occupation:   REVIEW OF SYSTEMS:    GU Review Male:   Patient denies frequent urination, hard to postpone urination, burning/ pain with urination, get up at night to urinate,  leakage of urine, stream starts and stops, trouble starting your streams, and have to strain to urinate .  Gastrointestinal (Lower):   Patient denies diarrhea and constipation.  Gastrointestinal (Upper):   Patient denies nausea and vomiting.  Constitutional:   Patient denies fever, night sweats, weight loss, and fatigue.  Skin:   Patient denies skin rash/ lesion and itching.  Eyes:   Patient denies blurred vision and double vision.  Ears/ Nose/ Throat:   Patient denies sore throat and sinus problems.  Hematologic/Lymphatic:   Patient denies swollen glands and easy bruising.  Cardiovascular:   Patient denies chest pains and leg swelling.  Respiratory:   Patient denies cough and shortness of breath.  Endocrine:   Patient denies excessive thirst.  Musculoskeletal:   Patient denies back pain and joint pain.  Neurological:   Patient denies headaches and dizziness.  Psychologic:   Patient denies depression and anxiety.  VITAL SIGNS:      01/19/2021 08:06 AM  Weight 272 lb / 123.38 kg  Height 74 in / 187.96 cm  BP 104/67 mmHg  Pulse 65 /min  Temperature 97.3 F / 36.2 C  BMI 34.9 kg/m   GU PHYSICAL EXAMINATION:    Prostate: Prostate about 40 grams. There is some mild asymmetry of the prostate noted with the right lobe greater than the left. No significant induration noted.   MULTI-SYSTEM PHYSICAL EXAMINATION:    Constitutional: Well-nourished. No physical deformities. Normally developed. Good grooming.  Respiratory: No labored breathing, no use of accessory muscles. Clear bilaterally.  Cardiovascular: Normal temperature, normal extremity pulses, no swelling, no varicosities. Regular rate and rhythm.  Gastrointestinal: No mass, no tenderness, no rigidity, obese abdomen. Small well-healed laparoscopic incisions noted from prior cholecystectomy     Complexity of Data:  Lab Test Review:   PSA  Records Review:   Pathology Reports, Previous Patient Records  X-Ray Review: MRI Prostate GSORAD:  Reviewed Films.     09/06/20 06/05/20 05/17/20 09/13/19 06/08/19 05/26/18 05/19/17 02/06/16  PSA  Total PSA 4.17 ng/mL 4.11 ng/mL 3.97 ng/ml 2.84 ng/mL 3.48 ng/mL 2.70 ng/mL 2.56 ng/mL 4.86   Free PSA 0.45 ng/mL 0.39 ng/mL      0.47   % Free PSA 11 % PSA 9 % PSA      10    Notes:                     CLINICAL DATA: Elevated PSA level of 4.17 on 09/07/2020. Prior  biopsy on 11/11/2012 benign   EXAM:  MR PROSTATE WITHOUT AND WITH CONTRAST   TECHNIQUE:  Multiplanar multisequence MRI images were obtained of the pelvis  centered about the prostate. Pre and post contrast images were  obtained.   CONTRAST: 66mL MULTIHANCE GADOBENATE DIMEGLUMINE 529 MG/ML IV SOLN   COMPARISON: 11/06/2015 prostate MRI   FINDINGS:  Prostate:   PI-RADS category 3 lesion of the anterior fibromuscular stroma  bilaterally and of the right anterior transition zone at the apex,  with indistinctly marginated low T2 signal but no overt restriction  of diffusion. This measures 1.6 by 0.9 by 1.0 cm (0.74 cubic cm) and  is shown for example on image 14 of series 8.   Faint low T2 signal generally nonfocal stranding in the peripheral  zone, likely postinflammatory.   Volume: 3D volumetric analysis: Prostate volume 23.71 cubic cm (4.7  by 3.2 by 3.4 cm).   Transcapsular spread: Absent   Seminal vesicle involvement: Absent   Neurovascular bundle involvement: Absent   Pelvic adenopathy: Absent   Bone metastasis: Absent   Other findings: No supplemental non-categorized findings.   IMPRESSION:  1. PI-RADS category 3 lesion of the anterior fibromuscular stroma  and of the right anterior transition zone at the apex. Targeting  data sent to [place].    Electronically Signed  By: Van Clines M.D.  On: 10/06/2020 08:59   PROCEDURES:          Urinalysis Dipstick Dipstick Cont'd  Color: Yellow Bilirubin: Neg mg/dL  Appearance: Clear Ketones: Neg mg/dL  Specific Gravity: 1.025 Blood: Neg ery/uL   pH: <=5.0 Protein: Neg mg/dL  Glucose: Neg mg/dL Urobilinogen: 0.2 mg/dL    Nitrites: Neg    Leukocyte Esterase: Neg leu/uL    ASSESSMENT:      ICD-10 Details  1 GU:   Prostate Cancer - C61    PLAN:  Schedule Return Visit/Planned Activity: Keep Scheduled Appointment          Document Letter(s):  Created for Patient: Clinical Summary         Notes:   1. Unfavorable intermediate risk prostate cancer: He has made the decision to proceed with curative intent therapy with surgery. He will be scheduled for a bilateral nerve-sparing robot assisted laparoscopic radical prostatectomy and bilateral pelvic lymphadenectomy. We discussed surgical therapy for prostate cancer including the different available surgical approaches. We discussed, in detail, the risks and expectations of surgery with regard to cancer control, urinary control, and erectile function as well as the expected postoperative recovery process. Additional risks of surgery including but not limited to bleeding, infection, hernia formation, nerve damage, lymphocele formation, bowel/rectal injury potentially necessitating colostomy, damage to the urinary tract resulting in urine leakage, urethral stricture, and the cardiopulmonary risks such as myocardial infarction, stroke, death, venothromboembolism, etc. were explained. The risk of open surgical conversion for robotic/laparoscopic prostatectomy was also discussed. We reviewed our goals of treatment including cancer control, regain of urinary control, and trying to optimize erectile function. All questions were answered to his stated satisfaction. He feels comfortable proceeding as planned.   CC: Dr. Unk Pinto  Dr. [name]         Next Appointment:      Next Appointment: 01/23/2021 08:00 AM    Appointment Type: 60 Physical Therapy    Location: [facility] [phone]    Provider: Lovenia Kim      E & M CODES: We spent 38 minutes  dedicated to evaluation and management time, including face to face interaction, discussions on coordination of care, documentation, result review, and discussion with others as applicable.     * Signed by Raynelle Bring, M.D. on 01/20/21 at 8:38 AM (EDT*

## 2021-01-28 MED ORDER — CEFAZOLIN IN SODIUM CHLORIDE 3-0.9 GM/100ML-% IV SOLN
3.0000 g | Freq: Once | INTRAVENOUS | Status: AC
  Administered 2021-01-29: 3 g via INTRAVENOUS
  Filled 2021-01-28: qty 100

## 2021-01-29 ENCOUNTER — Encounter (HOSPITAL_COMMUNITY)
Admission: RE | Disposition: A | Discharge status: Home / Self Care | Payer: Self-pay | Source: Home / Self Care | Attending: Urology

## 2021-01-29 ENCOUNTER — Ambulatory Visit (HOSPITAL_COMMUNITY): Payer: 59 | Admitting: Certified Registered"

## 2021-01-29 ENCOUNTER — Encounter (HOSPITAL_COMMUNITY): Payer: Self-pay | Admitting: Urology

## 2021-01-29 ENCOUNTER — Observation Stay (HOSPITAL_COMMUNITY)
Admission: RE | Admit: 2021-01-29 | Discharge: 2021-01-30 | Disposition: A | Discharge status: Home / Self Care | Payer: 59 | Attending: Urology | Admitting: Urology

## 2021-01-29 ENCOUNTER — Other Ambulatory Visit: Payer: Self-pay

## 2021-01-29 DIAGNOSIS — C61 Malignant neoplasm of prostate: Secondary | ICD-10-CM | POA: Diagnosis present

## 2021-01-29 DIAGNOSIS — D360 Benign neoplasm of lymph nodes: Secondary | ICD-10-CM | POA: Diagnosis not present

## 2021-01-29 HISTORY — PX: LYMPHADENECTOMY: SHX5960

## 2021-01-29 HISTORY — PX: ROBOT ASSISTED LAPAROSCOPIC RADICAL PROSTATECTOMY: SHX5141

## 2021-01-29 LAB — TYPE AND SCREEN
ABO/RH(D): O POS
Antibody Screen: NEGATIVE

## 2021-01-29 LAB — HEMOGLOBIN AND HEMATOCRIT, BLOOD
HCT: 42.7 % (ref 39.0–52.0)
Hemoglobin: 13.7 g/dL (ref 13.0–17.0)

## 2021-01-29 LAB — ABO/RH: ABO/RH(D): O POS

## 2021-01-29 SURGERY — XI ROBOTIC ASSISTED LAPAROSCOPIC RADICAL PROSTATECTOMY LEVEL 2
Anesthesia: General | Site: Prostate

## 2021-01-29 MED ORDER — MIDAZOLAM HCL 2 MG/2ML IJ SOLN
INTRAMUSCULAR | Status: AC
  Filled 2021-01-29: qty 2

## 2021-01-29 MED ORDER — HYDROMORPHONE HCL 1 MG/ML IJ SOLN
0.2500 mg | INTRAMUSCULAR | Status: DC | PRN
  Administered 2021-01-29: 0.5 mg via INTRAVENOUS

## 2021-01-29 MED ORDER — LIDOCAINE 2% (20 MG/ML) 5 ML SYRINGE
INTRAMUSCULAR | Status: AC
  Filled 2021-01-29: qty 5

## 2021-01-29 MED ORDER — PROMETHAZINE HCL 25 MG/ML IJ SOLN
INTRAMUSCULAR | Status: AC
  Filled 2021-01-29: qty 1

## 2021-01-29 MED ORDER — HYDROMORPHONE HCL 1 MG/ML IJ SOLN
INTRAMUSCULAR | Status: AC
  Filled 2021-01-29: qty 1

## 2021-01-29 MED ORDER — OXYCODONE HCL 5 MG PO TABS
5.0000 mg | ORAL_TABLET | Freq: Once | ORAL | Status: AC | PRN
  Administered 2021-01-29: 5 mg via ORAL

## 2021-01-29 MED ORDER — CEFAZOLIN SODIUM-DEXTROSE 1-4 GM/50ML-% IV SOLN
1.0000 g | Freq: Three times a day (TID) | INTRAVENOUS | Status: AC
  Administered 2021-01-29 – 2021-01-30 (×2): 1 g via INTRAVENOUS
  Filled 2021-01-29 (×2): qty 50

## 2021-01-29 MED ORDER — AMISULPRIDE (ANTIEMETIC) 5 MG/2ML IV SOLN
10.0000 mg | Freq: Once | INTRAVENOUS | Status: AC
  Administered 2021-01-29: 10 mg via INTRAVENOUS

## 2021-01-29 MED ORDER — FENTANYL CITRATE (PF) 100 MCG/2ML IJ SOLN
INTRAMUSCULAR | Status: DC | PRN
  Administered 2021-01-29 (×3): 50 ug via INTRAVENOUS
  Administered 2021-01-29: 25 ug via INTRAVENOUS
  Administered 2021-01-29 (×2): 50 ug via INTRAVENOUS
  Administered 2021-01-29: 25 ug via INTRAVENOUS

## 2021-01-29 MED ORDER — TRAMADOL HCL 50 MG PO TABS: 50.0000 mg | ORAL_TABLET | Freq: Four times a day (QID) | ORAL | 0 refills | Status: DC | PRN

## 2021-01-29 MED ORDER — ONDANSETRON HCL 4 MG/2ML IJ SOLN: 4.0000 mg | INTRAMUSCULAR | Status: DC | PRN

## 2021-01-29 MED ORDER — ROCURONIUM BROMIDE 10 MG/ML (PF) SYRINGE
PREFILLED_SYRINGE | INTRAVENOUS | Status: AC
  Filled 2021-01-29: qty 10

## 2021-01-29 MED ORDER — LACTATED RINGERS IV SOLN: INTRAVENOUS | Status: DC

## 2021-01-29 MED ORDER — HEPARIN SODIUM (PORCINE) 1000 UNIT/ML IJ SOLN
INTRAMUSCULAR | Status: AC
  Filled 2021-01-29: qty 1

## 2021-01-29 MED ORDER — LIDOCAINE 2% (20 MG/ML) 5 ML SYRINGE
INTRAMUSCULAR | Status: DC | PRN
  Administered 2021-01-29: 100 mg via INTRAVENOUS

## 2021-01-29 MED ORDER — KETOROLAC TROMETHAMINE 30 MG/ML IJ SOLN
INTRAMUSCULAR | Status: AC
  Filled 2021-01-29: qty 1

## 2021-01-29 MED ORDER — ONDANSETRON HCL 4 MG/2ML IJ SOLN
INTRAMUSCULAR | Status: AC
  Filled 2021-01-29: qty 2

## 2021-01-29 MED ORDER — BUPIVACAINE-EPINEPHRINE 0.25% -1:200000 IJ SOLN
INTRAMUSCULAR | Status: AC
  Filled 2021-01-29: qty 1

## 2021-01-29 MED ORDER — FENTANYL CITRATE (PF) 100 MCG/2ML IJ SOLN
INTRAMUSCULAR | Status: AC
  Filled 2021-01-29: qty 2

## 2021-01-29 MED ORDER — PROMETHAZINE HCL 25 MG/ML IJ SOLN
6.2500 mg | INTRAMUSCULAR | Status: DC | PRN
  Administered 2021-01-29: 6.25 mg via INTRAVENOUS

## 2021-01-29 MED ORDER — DEXAMETHASONE SODIUM PHOSPHATE 10 MG/ML IJ SOLN
INTRAMUSCULAR | Status: AC
  Filled 2021-01-29: qty 1

## 2021-01-29 MED ORDER — KETOROLAC TROMETHAMINE 15 MG/ML IJ SOLN
15.0000 mg | Freq: Four times a day (QID) | INTRAMUSCULAR | Status: DC
  Administered 2021-01-29 – 2021-01-30 (×3): 15 mg via INTRAVENOUS
  Filled 2021-01-29 (×3): qty 1

## 2021-01-29 MED ORDER — NONFORMULARY OR COMPOUNDED ITEM
2.0000 | Freq: Two times a day (BID) | Status: DC
  Filled 2021-01-29 (×3): qty 1

## 2021-01-29 MED ORDER — ORAL CARE MOUTH RINSE: 15.0000 mL | Freq: Once | OROMUCOSAL | Status: AC

## 2021-01-29 MED ORDER — ONDANSETRON HCL 4 MG/2ML IJ SOLN
INTRAMUSCULAR | Status: DC | PRN
  Administered 2021-01-29: 4 mg via INTRAVENOUS

## 2021-01-29 MED ORDER — MIDAZOLAM HCL 5 MG/5ML IJ SOLN
INTRAMUSCULAR | Status: DC | PRN
  Administered 2021-01-29: 2 mg via INTRAVENOUS

## 2021-01-29 MED ORDER — ROCURONIUM BROMIDE 10 MG/ML (PF) SYRINGE
PREFILLED_SYRINGE | INTRAVENOUS | Status: DC | PRN
  Administered 2021-01-29: 30 mg via INTRAVENOUS
  Administered 2021-01-29: 20 mg via INTRAVENOUS
  Administered 2021-01-29: 70 mg via INTRAVENOUS
  Administered 2021-01-29: 20 mg via INTRAVENOUS

## 2021-01-29 MED ORDER — ALLOPURINOL 300 MG PO TABS
300.0000 mg | ORAL_TABLET | Freq: Every day | ORAL | Status: DC
  Administered 2021-01-30: 300 mg via ORAL
  Filled 2021-01-29: qty 1

## 2021-01-29 MED ORDER — ORAL CARE MOUTH RINSE
15.0000 mL | Freq: Two times a day (BID) | OROMUCOSAL | Status: DC
  Administered 2021-01-29: 15 mL via OROMUCOSAL

## 2021-01-29 MED ORDER — AMISULPRIDE (ANTIEMETIC) 5 MG/2ML IV SOLN
INTRAVENOUS | Status: AC
  Filled 2021-01-29: qty 4

## 2021-01-29 MED ORDER — MORPHINE SULFATE (PF) 2 MG/ML IV SOLN: 2.0000 mg | INTRAVENOUS | Status: DC | PRN

## 2021-01-29 MED ORDER — SULFAMETHOXAZOLE-TRIMETHOPRIM 800-160 MG PO TABS: 1.0000 | ORAL_TABLET | Freq: Two times a day (BID) | ORAL | 0 refills | Status: DC

## 2021-01-29 MED ORDER — SUGAMMADEX SODIUM 200 MG/2ML IV SOLN
INTRAVENOUS | Status: DC | PRN
  Administered 2021-01-29: 200 mg via INTRAVENOUS

## 2021-01-29 MED ORDER — OXYCODONE HCL 5 MG PO TABS
ORAL_TABLET | ORAL | Status: AC
  Filled 2021-01-29: qty 1

## 2021-01-29 MED ORDER — CHLORHEXIDINE GLUCONATE 0.12 % MT SOLN
15.0000 mL | Freq: Once | OROMUCOSAL | Status: AC
  Administered 2021-01-29: 15 mL via OROMUCOSAL

## 2021-01-29 MED ORDER — MAGNESIUM CITRATE PO SOLN: 1.0000 | Freq: Once | ORAL | Status: DC

## 2021-01-29 MED ORDER — BUPIVACAINE-EPINEPHRINE 0.25% -1:200000 IJ SOLN
INTRAMUSCULAR | Status: DC | PRN
  Administered 2021-01-29: 50 mL

## 2021-01-29 MED ORDER — FLEET ENEMA 7-19 GM/118ML RE ENEM: 1.0000 | ENEMA | Freq: Once | RECTAL | Status: DC

## 2021-01-29 MED ORDER — LACTATED RINGERS IV SOLN
INTRAVENOUS | Status: DC | PRN
  Administered 2021-01-29: 1001 mL

## 2021-01-29 MED ORDER — PANTOPRAZOLE SODIUM 40 MG PO TBEC
40.0000 mg | DELAYED_RELEASE_TABLET | Freq: Every day | ORAL | Status: DC
  Administered 2021-01-30: 40 mg via ORAL
  Filled 2021-01-29: qty 1

## 2021-01-29 MED ORDER — SODIUM CHLORIDE 0.9 % IR SOLN
Status: DC | PRN
  Administered 2021-01-29: 1000 mL via INTRAVESICAL

## 2021-01-29 MED ORDER — SODIUM CHLORIDE 0.9 % IV BOLUS
1000.0000 mL | Freq: Once | INTRAVENOUS | Status: AC
  Administered 2021-01-29: 1000 mL via INTRAVENOUS

## 2021-01-29 MED ORDER — DEXMEDETOMIDINE (PRECEDEX) IN NS 20 MCG/5ML (4 MCG/ML) IV SYRINGE
PREFILLED_SYRINGE | INTRAVENOUS | Status: DC | PRN
  Administered 2021-01-29 (×2): 8 ug via INTRAVENOUS

## 2021-01-29 MED ORDER — DOCUSATE SODIUM 100 MG PO CAPS: 100.0000 mg | ORAL_CAPSULE | Freq: Two times a day (BID) | ORAL | Status: DC

## 2021-01-29 MED ORDER — PROPOFOL 10 MG/ML IV BOLUS
INTRAVENOUS | Status: DC | PRN
  Administered 2021-01-29: 200 mg via INTRAVENOUS

## 2021-01-29 MED ORDER — MELATONIN 5 MG PO TABS: 5.0000 mg | ORAL_TABLET | Freq: Every evening | ORAL | Status: DC | PRN

## 2021-01-29 MED ORDER — ACETAMINOPHEN 10 MG/ML IV SOLN
1000.0000 mg | Freq: Four times a day (QID) | INTRAVENOUS | Status: DC
  Administered 2021-01-29 – 2021-01-30 (×3): 1000 mg via INTRAVENOUS
  Filled 2021-01-29 (×3): qty 100

## 2021-01-29 MED ORDER — STERILE WATER FOR IRRIGATION IR SOLN
Status: DC | PRN
  Administered 2021-01-29: 1000 mL

## 2021-01-29 MED ORDER — DEXTROSE-NACL 5-0.45 % IV SOLN: INTRAVENOUS | Status: DC

## 2021-01-29 MED ORDER — TRAMADOL HCL 50 MG PO TABS: 50.0000 mg | ORAL_TABLET | Freq: Four times a day (QID) | ORAL | Status: DC | PRN

## 2021-01-29 MED ORDER — KETOROLAC TROMETHAMINE 30 MG/ML IJ SOLN
30.0000 mg | Freq: Once | INTRAMUSCULAR | Status: AC | PRN
  Administered 2021-01-29: 30 mg via INTRAVENOUS

## 2021-01-29 MED ORDER — CHLORHEXIDINE GLUCONATE CLOTH 2 % EX PADS
6.0000 | MEDICATED_PAD | Freq: Every day | CUTANEOUS | Status: DC
  Administered 2021-01-30: 6 via TOPICAL

## 2021-01-29 MED ORDER — OXYCODONE HCL 5 MG/5ML PO SOLN: 5.0000 mg | Freq: Once | ORAL | Status: AC | PRN

## 2021-01-29 MED ORDER — PROPOFOL 10 MG/ML IV BOLUS
INTRAVENOUS | Status: AC
  Filled 2021-01-29: qty 20

## 2021-01-29 MED ORDER — DEXAMETHASONE SODIUM PHOSPHATE 10 MG/ML IJ SOLN
INTRAMUSCULAR | Status: DC | PRN
  Administered 2021-01-29: 10 mg via INTRAVENOUS

## 2021-01-29 MED ORDER — SUCCINYLCHOLINE CHLORIDE 200 MG/10ML IV SOSY
PREFILLED_SYRINGE | INTRAVENOUS | Status: AC
  Filled 2021-01-29: qty 10

## 2021-01-29 MED ORDER — BUDESONIDE 0.25 MG/2ML IN SUSP: 0.2500 mg | Freq: Two times a day (BID) | RESPIRATORY_TRACT | Status: DC

## 2021-01-29 MED ORDER — EPHEDRINE SULFATE-NACL 50-0.9 MG/10ML-% IV SOSY
PREFILLED_SYRINGE | INTRAVENOUS | Status: DC | PRN
  Administered 2021-01-29 (×2): 5 mg via INTRAVENOUS

## 2021-01-29 MED FILL — Fluticasone Propionate HFA Inhal Aer 220 MCG/ACT: RESPIRATORY_TRACT | Qty: 12 | Status: AC

## 2021-01-29 SURGICAL SUPPLY — 70 items
ADH SKN CLS APL DERMABOND .7 (GAUZE/BANDAGES/DRESSINGS) ×2
APL PRP STRL LF DISP 70% ISPRP (MISCELLANEOUS) ×2
APL SWBSTK 6 STRL LF DISP (MISCELLANEOUS)
APPLICATOR COTTON TIP 6 STRL (MISCELLANEOUS) IMPLANT
APPLICATOR COTTON TIP 6IN STRL (MISCELLANEOUS)
BAG COUNTER SPONGE SURGICOUNT (BAG) IMPLANT
BAG SPNG CNTER NS LX DISP (BAG)
CATH FOLEY 2WAY SLVR 18FR 30CC (CATHETERS) ×3 IMPLANT
CATH ROBINSON RED A/P 16FR (CATHETERS) ×3 IMPLANT
CATH ROBINSON RED A/P 8FR (CATHETERS) ×3 IMPLANT
CATH TIEMANN FOLEY 18FR 5CC (CATHETERS) ×3 IMPLANT
CHLORAPREP W/TINT 26 (MISCELLANEOUS) ×3 IMPLANT
CLIP VESOLOCK LG 6/CT PURPLE (CLIP) ×9 IMPLANT
COVER SURGICAL LIGHT HANDLE (MISCELLANEOUS) ×3 IMPLANT
COVER TIP SHEARS 8 DVNC (MISCELLANEOUS) ×2 IMPLANT
COVER TIP SHEARS 8MM DA VINCI (MISCELLANEOUS) ×2
CUTTER ECHEON FLEX ENDO 45 340 (ENDOMECHANICALS) ×3 IMPLANT
DECANTER SPIKE VIAL GLASS SM (MISCELLANEOUS) IMPLANT
DERMABOND ADVANCED (GAUZE/BANDAGES/DRESSINGS) ×1
DERMABOND ADVANCED .7 DNX12 (GAUZE/BANDAGES/DRESSINGS) ×2 IMPLANT
DRAIN CHANNEL RND F F (WOUND CARE) IMPLANT
DRAPE ARM DVNC X/XI (DISPOSABLE) ×8 IMPLANT
DRAPE COLUMN DVNC XI (DISPOSABLE) ×2 IMPLANT
DRAPE DA VINCI XI ARM (DISPOSABLE) ×12
DRAPE DA VINCI XI COLUMN (DISPOSABLE) ×3
DRAPE SHEET LG 3/4 BI-LAMINATE (DRAPES) ×3 IMPLANT
DRAPE SURG IRRIG POUCH 19X23 (DRAPES) ×3 IMPLANT
DRSG TEGADERM 4X4.75 (GAUZE/BANDAGES/DRESSINGS) ×3 IMPLANT
ELECT PENCIL ROCKER SW 15FT (MISCELLANEOUS) ×3 IMPLANT
ELECT REM PT RETURN 15FT ADLT (MISCELLANEOUS) ×3 IMPLANT
GAUZE SPONGE 4X4 12PLY STRL (GAUZE/BANDAGES/DRESSINGS) ×3 IMPLANT
GLOVE SURG ENC MOIS LTX SZ6.5 (GLOVE) ×15 IMPLANT
GLOVE SURG ENC TEXT LTX SZ7.5 (GLOVE) ×9 IMPLANT
GLOVE SURG UNDER POLY LF SZ6.5 (GLOVE) ×15 IMPLANT
GOWN STRL REUS W/TWL LRG LVL3 (GOWN DISPOSABLE) ×15 IMPLANT
GOWN STRL REUS W/TWL XL LVL3 (GOWN DISPOSABLE) ×9 IMPLANT
HOLDER FOLEY CATH W/STRAP (MISCELLANEOUS) ×3 IMPLANT
IRRIG SUCT STRYKERFLOW 2 WTIP (MISCELLANEOUS) ×3
IRRIGATION SUCT STRKRFLW 2 WTP (MISCELLANEOUS) ×2 IMPLANT
IV LACTATED RINGERS 1000ML (IV SOLUTION) ×3 IMPLANT
IV NS 1000ML (IV SOLUTION) ×3
IV NS 1000ML BAXH (IV SOLUTION) ×2 IMPLANT
KIT TURNOVER KIT A (KITS) ×3 IMPLANT
NDL SAFETY ECLIPSE 18X1.5 (NEEDLE) ×2 IMPLANT
NEEDLE HYPO 18GX1.5 SHARP (NEEDLE) ×3
PACK ROBOT UROLOGY CUSTOM (CUSTOM PROCEDURE TRAY) ×3 IMPLANT
PENCIL SMOKE EVACUATOR (MISCELLANEOUS) IMPLANT
SEAL CANN UNIV 5-8 DVNC XI (MISCELLANEOUS) ×8 IMPLANT
SEAL XI 5MM-8MM UNIVERSAL (MISCELLANEOUS) ×12
SET TUBE SMOKE EVAC HIGH FLOW (TUBING) ×3 IMPLANT
SOLUTION ELECTROLUBE (MISCELLANEOUS) ×3 IMPLANT
STAPLE RELOAD 45 GRN (STAPLE) ×2 IMPLANT
STAPLE RELOAD 45MM GREEN (STAPLE) ×3
SUT ETHILON 3 0 PS 1 (SUTURE) ×3 IMPLANT
SUT MNCRL 3 0 RB1 (SUTURE) ×2 IMPLANT
SUT MNCRL 3 0 VIOLET RB1 (SUTURE) ×2 IMPLANT
SUT MNCRL AB 4-0 PS2 18 (SUTURE) ×6 IMPLANT
SUT MONOCRYL 3 0 RB1 (SUTURE) ×4
SUT VIC AB 0 CT1 27 (SUTURE) ×3
SUT VIC AB 0 CT1 27XBRD ANTBC (SUTURE) ×2 IMPLANT
SUT VIC AB 0 UR5 27 (SUTURE) ×3 IMPLANT
SUT VIC AB 2-0 SH 27 (SUTURE) ×3
SUT VIC AB 2-0 SH 27X BRD (SUTURE) ×2 IMPLANT
SUT VIC AB 3-0 SH 27 (SUTURE) ×6
SUT VIC AB 3-0 SH 27XBRD (SUTURE) ×4 IMPLANT
SUT VICRYL 0 UR6 27IN ABS (SUTURE) ×6 IMPLANT
SYR 27GX1/2 1ML LL SAFETY (SYRINGE) ×3 IMPLANT
TOWEL OR NON WOVEN STRL DISP B (DISPOSABLE) ×3 IMPLANT
TROCAR XCEL NON-BLD 5MMX100MML (ENDOMECHANICALS) ×3 IMPLANT
WATER STERILE IRR 1000ML POUR (IV SOLUTION) ×3 IMPLANT

## 2021-01-29 NOTE — Anesthesia Preprocedure Evaluation (Signed)
Anesthesia Evaluation  Patient identified by MRN, date of birth, ID band Patient awake    Reviewed: Allergy & Precautions, NPO status , Patient's Chart, lab work & pertinent test results  Airway Mallampati: III  TM Distance: <3 FB Neck ROM: Full    Dental no notable dental hx.    Pulmonary neg pulmonary ROS,    Pulmonary exam normal breath sounds clear to auscultation       Cardiovascular hypertension, Normal cardiovascular exam Rhythm:Regular Rate:Normal     Neuro/Psych negative neurological ROS  negative psych ROS   GI/Hepatic Neg liver ROS, GERD  Medicated,  Endo/Other  negative endocrine ROS  Renal/GU negative Renal ROS  negative genitourinary   Musculoskeletal negative musculoskeletal ROS (+)   Abdominal   Peds negative pediatric ROS (+)  Hematology negative hematology ROS (+)   Anesthesia Other Findings   Reproductive/Obstetrics negative OB ROS                             Anesthesia Physical Anesthesia Plan  ASA: 2  Anesthesia Plan: General   Post-op Pain Management:    Induction: Intravenous  PONV Risk Score and Plan: 2 and Ondansetron, Dexamethasone and Treatment may vary due to age or medical condition  Airway Management Planned: Oral ETT  Additional Equipment:   Intra-op Plan:   Post-operative Plan: Extubation in OR  Informed Consent: I have reviewed the patients History and Physical, chart, labs and discussed the procedure including the risks, benefits and alternatives for the proposed anesthesia with the patient or authorized representative who has indicated his/her understanding and acceptance.     Dental advisory given  Plan Discussed with: CRNA and Surgeon  Anesthesia Plan Comments:         Anesthesia Quick Evaluation

## 2021-01-29 NOTE — Interval H&P Note (Signed)
History and Physical Interval Note:  01/29/2021 7:01 AM  [name]  has presented today for surgery, with the diagnosis of PROSTATE CANCER.  The various methods of treatment have been discussed with the patient and family. After consideration of risks, benefits and other options for treatment, the patient has consented to  Procedure(s): XI ROBOTIC ASSISTED LAPAROSCOPIC RADICAL PROSTATECTOMY LEVEL 2 (N/A) LYMPHADENECTOMY, PELVIC (Bilateral) as a surgical intervention.  The patient's history has been reviewed, patient examined, no change in status, stable for surgery.  I have reviewed the patient's chart and labs.  Questions were answered to the patient's satisfaction.     [employer]

## 2021-01-29 NOTE — Transfer of Care (Signed)
Immediate Anesthesia Transfer of Care Note  Patient: [name]  Procedure(s) Performed: XI ROBOTIC ASSISTED LAPAROSCOPIC RADICAL PROSTATECTOMY LEVEL 2 (Prostate) LYMPHADENECTOMY, PELVIC (Bilateral: Pelvis)  Patient Location: PACU  Anesthesia Type:General  Level of Consciousness: awake  Airway & Oxygen Therapy: Patient Spontanous Breathing and Patient connected to face mask oxygen  Post-op Assessment: Report given to RN and Post -op Vital signs reviewed and stable  Post vital signs: Reviewed and stable  Last Vitals:  Vitals Value Taken Time  BP 131/70 01/29/21 1115  Temp    Pulse 94 01/29/21 1116  Resp 14 01/29/21 1116  SpO2 100 % 01/29/21 1116  Vitals shown include unvalidated device data.  Last Pain:  Vitals:   01/29/21 0605  TempSrc:   PainSc: 0-No pain      Patients Stated Pain Goal: 3 (82/06/01 5615)  Complications: No notable events documented.

## 2021-01-29 NOTE — Plan of Care (Signed)
  Problem: Bowel/Gastric: Goal: Gastrointestinal status for postoperative course will improve Outcome: Progressing   Problem: Pain Management: Goal: General experience of comfort will improve Outcome: Progressing   Problem: Skin Integrity: Goal: Demonstration of wound healing without infection will improve Outcome: Progressing   Problem: Skin Integrity: Goal: Risk for impaired skin integrity will decrease Outcome: Progressing   Problem: Safety: Goal: Ability to remain free from injury will improve Outcome: Progressing   Problem: Pain Managment: Goal: General experience of comfort will improve Outcome: Progressing   Problem: Activity: Goal: Risk for activity intolerance will decrease Outcome: Progressing

## 2021-01-29 NOTE — Progress Notes (Signed)
Patient ID: [name], male   DOB: 06/13/1966, 55 y.o.   MRN: [id number]  Post-op note  Subjective: The patient is doing well.  No complaints.  Objective: Vital signs in last 24 hours: Temp:  [97.6 F (36.4 C)-98.3 F (36.8 C)] 98 F (36.7 C) (07/18 1513) Pulse Rate:  [61-97] 68 (07/18 1513) Resp:  [9-18] 16 (07/18 1513) BP: (101-136)/(56-93) 135/84 (07/18 1513) SpO2:  [96 %-100 %] 97 % (07/18 1513) Weight:  [461 kg] 123 kg (07/18 1609)  Intake/Output from previous day: No intake/output data recorded. Intake/Output this shift: Total I/O In: 1000 [I.V.:1000] Out: 850 [Urine:325; Drains:125; Blood:400]  Physical Exam:  General: Alert and oriented. Abdomen: Soft, Nondistended. Incisions: Clean and dry. GU: Urine clearing.  Lab Results: Recent Labs    01/29/21 1121  HGB 13.7  HCT 42.7    Assessment/Plan: POD#0   1) Continue to monitor, ambulate, IS   [name]. MD   LOS: 0 days   [name] 01/29/2021, 6:14 PM

## 2021-01-29 NOTE — Op Note (Signed)
Preoperative diagnosis: Clinically localized adenocarcinoma of the prostate (clinical stage T1c Nx Mx)  Postoperative diagnosis: Clinically localized adenocarcinoma of the prostate (clinical stage T1c Nx Mx)  Procedure:  Robotic assisted laparoscopic radical prostatectomy (bilateral nerve sparing) Bilateral robotic assisted laparoscopic pelvic lymphadenectomy  Surgeon: [name]. M.D.  Assistant: Debbrah Alar, PA-C  An assistant was required for this surgical procedure.  The duties of the assistant included but were not limited to suctioning, passing suture, camera manipulation, retraction. This procedure would not be able to be performed without an Environmental consultant.  Resident: Dr. Bishop Limbo  Anesthesia: General  Complications: None  EBL: 400 mL  IVF:  1000 mL crystalloid  Specimens: Prostate and seminal vesicles Right pelvic lymph nodes Left pelvic lymph nodes  Disposition of specimens: Pathology  Drains: 20 Fr coude catheter # 19 Blake pelvic drain  Indication: [name] is a 55 y.o. year old patient with clinically localized prostate cancer.  After a thorough review of the management options for treatment of prostate cancer, he elected to proceed with surgical therapy and the above procedure(s).  We have discussed the potential benefits and risks of the procedure, side effects of the proposed treatment, the likelihood of the patient achieving the goals of the procedure, and any potential problems that might occur during the procedure or recuperation. Informed consent has been obtained.  Description of procedure:  The patient was taken to the operating room and a general anesthetic was administered. He was given preoperative antibiotics, placed in the dorsal lithotomy position, and prepped and draped in the usual sterile fashion. Next a preoperative timeout was performed. A urethral catheter was placed into the bladder and a site was selected near the  umbilicus for placement of the camera port. This was placed using a standard open Hassan technique which allowed entry into the peritoneal cavity under direct vision and without difficulty. An 8 mm robotic port was placed and a pneumoperitoneum established. The camera was then used to inspect the abdomen and there was no evidence of any intra-abdominal injuries or other abnormalities. The remaining abdominal ports were then placed. 8 mm robotic ports were placed in the right lower quadrant, left lower quadrant, and far left lateral abdominal wall. A 5 mm port was placed in the right upper quadrant and a 12 mm port was placed in the right lateral abdominal wall for laparoscopic assistance. All ports were placed under direct vision without difficulty. The surgical cart was then docked.   Utilizing the cautery scissors, the bladder was reflected posteriorly allowing entry into the space of Retzius and identification of the endopelvic fascia and prostate. The periprostatic fat was then removed from the prostate allowing full exposure of the endopelvic fascia. The endopelvic fascia was then incised from the apex back to the base of the prostate bilaterally and the underlying levator muscle fibers were swept laterally off the prostate thereby isolating the dorsal venous complex. The dorsal vein was then stapled and divided with a 45 mm Flex Echelon stapler. Attention then turned to the bladder neck which was divided anteriorly thereby allowing entry into the bladder and exposure of the urethral catheter. The catheter balloon was deflated and the catheter was brought into the operative field and used to retract the prostate anteriorly. The posterior bladder neck was then examined and was divided allowing further dissection between the bladder and prostate posteriorly until the vasa deferentia and seminal vessels were identified. The vasa deferentia were isolated, divided, and lifted anteriorly. The seminal vesicles  were  dissected down to their tips with care to control the seminal vascular arterial blood supply. These structures were then lifted anteriorly and the space between Denonvillier's fascia and the anterior rectum was developed with a combination of sharp and blunt dissection. This isolated the vascular pedicles of the prostate.  The lateral prostatic fascia was then sharply incised allowing release of the neurovascular bundles bilaterally. The vascular pedicles of the prostate were then ligated with Weck clips between the prostate and neurovascular bundles and divided with sharp cold scissor dissection resulting in neurovascular bundle preservation. The neurovascular bundles were then separated off the apex of the prostate and urethra bilaterally.  The urethra was then sharply transected allowing the prostate specimen to be disarticulated. The pelvis was copiously irrigated and hemostasis was ensured. There was no evidence for rectal injury.  Attention then turned to the right pelvic sidewall. The fibrofatty tissue between the external iliac vein, confluence of the iliac vessels, hypogastric artery, and Cooper's ligament was dissected free from the pelvic sidewall with care to preserve the obturator nerve. Weck clips were used for lymphostasis and hemostasis. An identical procedure was performed on the contralateral side and the lymphatic packets were removed for permanent pathologic analysis.  Attention then turned to the urethral anastomosis. A 2-0 Vicryl slip knot was placed between Denonvillier's fascia, the posterior bladder neck, and the posterior urethra to reapproximate these structures. A double-armed 3-0 Monocryl suture was then used to perform a 360 running tension-free anastomosis between the bladder neck and urethra. A new urethral catheter was then placed into the bladder and irrigated. There were no blood clots within the bladder and the anastomosis appeared to be watertight. A #19 Blake drain was  then brought through the left lateral 8 mm port site and positioned appropriately within the pelvis. It was secured to the skin with a nylon suture. The surgical cart was then undocked. The right lateral 12 mm port site was closed at the fascial level with a 0 Vicryl suture placed laparoscopically. All remaining ports were then removed under direct vision. The prostate specimen was removed intact within the Endopouch retrieval bag via the periumbilical camera port site. This fascial opening was closed with two running 0 Vicryl sutures. 0.25% Marcaine was then injected into all port sites and all incisions were reapproximated at the skin level with 4-0 Monocryl subcuticular sutures and Dermabond. The patient appeared to tolerate the procedure well and without complications. The patient was able to be extubated and transferred to the recovery unit in satisfactory condition.   [name] MD

## 2021-01-29 NOTE — Anesthesia Postprocedure Evaluation (Signed)
Anesthesia Post Note  Patient: [name]  Procedure(s) Performed: XI ROBOTIC ASSISTED LAPAROSCOPIC RADICAL PROSTATECTOMY LEVEL 2 (Prostate) LYMPHADENECTOMY, PELVIC (Bilateral: Pelvis)     Patient location during evaluation: PACU Anesthesia Type: General Level of consciousness: awake and alert Pain management: pain level controlled Vital Signs Assessment: post-procedure vital signs reviewed and stable Respiratory status: spontaneous breathing, nonlabored ventilation, respiratory function stable and patient connected to nasal cannula oxygen Cardiovascular status: blood pressure returned to baseline and stable Postop Assessment: no apparent nausea or vomiting Anesthetic complications: no   No notable events documented.  Last Vitals:  Vitals:   01/29/21 1513 01/29/21 1923  BP: 135/84 130/73  Pulse: 68 78  Resp: 16   Temp: 36.7 C 36.6 C  SpO2: 97% 95%    Last Pain:  Vitals:   01/29/21 1923  TempSrc: Oral  PainSc:                  Shadow Schedler S

## 2021-01-29 NOTE — Discharge Summary (Signed)
Date of admission: 01/29/2021  Date of discharge: 01/30/2021  Admission diagnosis: Prostate Cancer  Discharge diagnosis: Prostate Cancer  History and Physical: For full details, please see admission history and physical. Briefly, [name] is a 55 y.o. gentleman with localized prostate cancer.  After discussing management/treatment options, he elected to proceed with surgical treatment.  Hospital Course: [name] was taken to the operating room on 01/29/2021 and underwent a robotic assisted laparoscopic radical prostatectomy. He tolerated this procedure well and without complications. Postoperatively, he was able to be transferred to a regular hospital room following recovery from anesthesia.  He was able to begin ambulating the night of surgery. He remained hemodynamically stable overnight.  He had excellent urine output with appropriately minimal output from his pelvic drain and his pelvic drain was removed on POD #1.  He was transitioned to oral pain medication, tolerated a clear liquid diet, and had met all discharge criteria and was able to be discharged home later on POD#1.  Laboratory values:  Recent Labs    01/29/21 1121 01/30/21 0422  HGB 13.7 11.8*  HCT 42.7 36.4*    Disposition: Home  Discharge instruction: He was instructed to be ambulatory but to refrain from heavy lifting, strenuous activity, or driving. He was instructed on urethral catheter care.  Discharge medications:   Allergies as of 01/30/2021   No Known Allergies      Medication List     STOP taking these medications    B-12 5000 MCG Subl   FIBER ADULT GUMMIES PO   ibuprofen 200 MG tablet Commonly known as: ADVIL   MELATONIN GUMMIES PO   phentermine 37.5 MG tablet Commonly known as: ADIPEX-P   Vitamin D 50 MCG (2000 UT) Caps       TAKE these medications    allopurinol 300 MG tablet Commonly known as: ZYLOPRIM TAKE 1 TABLET DAILY TO PREVENT GOUT What changed: See the new  instructions.   cetirizine 10 MG tablet Commonly known as: ZYRTEC Take 10 mg by mouth daily.   colchicine 0.6 MG tablet Take two tablets (1.85m) at onset of gout symptoms.  Then take one tablet one hour later x1.   docusate sodium 100 MG capsule Commonly known as: COLACE Take 1 capsule (100 mg total) by mouth 2 (two) times daily.   Flovent HFA 220 MCG/ACT inhaler Generic drug: fluticasone SWALLOW, DO NOT INHALE, 2 PUFFS TWICE A DAY. DO NOT EAT OR DRINK FOR 30 MINUTES AFTER USE. RINSE MOUTH WELL AFTER USE. What changed: See the new instructions.   Minoxidil for Men 5 % Foam Generic drug: Minoxidil Apply to area of thinning hair twice daily. Rinse hands and other skin areas after use. What changed:  how much to take how to take this when to take this   pantoprazole 40 MG tablet Commonly known as: PROTONIX Take 1 tablet (40 mg total) by mouth daily.   sulfamethoxazole-trimethoprim 800-160 MG tablet Commonly known as: BACTRIM DS Take 1 tablet by mouth 2 (two) times daily. Start the day prior to foley removal appointment   traMADol 50 MG tablet Commonly known as: Ultram Take 1-2 tablets (50-100 mg total) by mouth every 6 (six) hours as needed for moderate pain or severe pain.        Followup: He will followup in 1 week for catheter removal and to discuss his surgical pathology results.

## 2021-01-29 NOTE — Discharge Instructions (Signed)

## 2021-01-29 NOTE — Anesthesia Procedure Notes (Signed)
Procedure Name: Intubation Date/Time: 01/29/2021 7:18 AM Performed by: Lind Covert, CRNA Pre-anesthesia Checklist: Patient identified, Emergency Drugs available, Suction available and Patient being monitored Patient Re-evaluated:Patient Re-evaluated prior to induction Oxygen Delivery Method: Circle System Utilized Preoxygenation: Pre-oxygenation with 100% oxygen Induction Type: IV induction Ventilation: Mask ventilation without difficulty Laryngoscope Size: Mac and 3 Grade View: Grade I Tube type: Oral Tube size: 7.5 mm Number of attempts: 1 Airway Equipment and Method: Stylet Placement Confirmation: ETT inserted through vocal cords under direct vision, positive ETCO2 and breath sounds checked- equal and bilateral Secured at: 24 cm Tube secured with: Tape Dental Injury: Teeth and Oropharynx as per pre-operative assessment

## 2021-01-30 ENCOUNTER — Encounter (HOSPITAL_COMMUNITY): Payer: Self-pay | Admitting: Urology

## 2021-01-30 DIAGNOSIS — C61 Malignant neoplasm of prostate: Secondary | ICD-10-CM | POA: Diagnosis not present

## 2021-01-30 LAB — CBC
HCT: 36.4 % — ABNORMAL LOW (ref 39.0–52.0)
Hemoglobin: 11.8 g/dL — ABNORMAL LOW (ref 13.0–17.0)
MCH: 29.1 pg (ref 26.0–34.0)
MCHC: 32.4 g/dL (ref 30.0–36.0)
MCV: 89.7 fL (ref 80.0–100.0)
Platelets: 236 10*3/uL (ref 150–400)
RBC: 4.06 MIL/uL — ABNORMAL LOW (ref 4.22–5.81)
RDW: 14.1 % (ref 11.5–15.5)
WBC: 18.8 10*3/uL — ABNORMAL HIGH (ref 4.0–10.5)
nRBC: 0 % (ref 0.0–0.2)

## 2021-01-30 LAB — BASIC METABOLIC PANEL
Anion gap: 7 (ref 5–15)
BUN: 17 mg/dL (ref 6–20)
CO2: 25 mmol/L (ref 22–32)
Calcium: 8.8 mg/dL — ABNORMAL LOW (ref 8.9–10.3)
Chloride: 104 mmol/L (ref 98–111)
Creatinine, Ser: 1.08 mg/dL (ref 0.61–1.24)
GFR, Estimated: >60 mL/min (ref >60)
Glucose, Bld: 143 mg/dL — ABNORMAL HIGH (ref 70–99)
Potassium: 4.5 mmol/L (ref 3.5–5.1)
Sodium: 136 mmol/L (ref 135–145)

## 2021-01-30 MED ORDER — BISACODYL 10 MG RE SUPP
10.0000 mg | Freq: Once | RECTAL | Status: AC
  Administered 2021-01-30: 10 mg via RECTAL
  Filled 2021-01-30: qty 1

## 2021-01-30 MED ORDER — FLUTICASONE PROPIONATE HFA 220 MCG/ACT IN AERO
2.0000 | INHALATION_SPRAY | Freq: Two times a day (BID) | RESPIRATORY_TRACT | Status: DC
  Administered 2021-01-30: 2 via RESPIRATORY_TRACT
  Filled 2021-01-30: qty 12

## 2021-01-30 NOTE — Progress Notes (Signed)
Patient ID: [name], male   DOB: [birth year], 55 y.o.   MRN: [id number]  1 Day Post-Op Subjective: The patient is doing well.  No nausea or vomiting. Pain is adequately controlled.  Objective: Vital signs in last 24 hours: Temp:  [97.6 F (36.4 C)-98.2 F (36.8 C)] 97.6 F (36.4 C) (07/19 0349) Pulse Rate:  [60-97] 60 (07/19 0349) Resp:  [9-17] 17 (07/19 0349) BP: (101-136)/(56-84) 107/62 (07/19 0349) SpO2:  [94 %-100 %] 95 % (07/19 0349) Weight:  [833 kg] 123 kg (07/18 1609)  Intake/Output from previous day: 07/18 0701 - 07/19 0700 In: 1945 [I.V.:1645; IV Piggyback:300] Out: 2507 [Urine:1850; Drains:257; Blood:400] Intake/Output this shift: No intake/output data recorded.  Physical Exam:  General: Alert and oriented. CV: RRR Lungs: Clear bilaterally. GI: Soft, Nondistended. Incisions: Clean, dry, and intact Urine: Clear Extremities: Nontender, no erythema, no edema.  Lab Results: Recent Labs    01/29/21 1121 01/30/21 0422  HGB 13.7 11.8*  HCT 42.7 36.4*      Assessment/Plan: POD# 1 s/p robotic prostatectomy.  1) SL IVF 2) Ambulate, Incentive spirometry 3) Transition to oral pain medication 4) Dulcolax suppository 5) D/C pelvic drain 6) Plan for likely discharge later today   [name]. MD   LOS: 0 days   [name] 01/30/2021, 7:10 AM

## 2021-02-01 ENCOUNTER — Encounter (HOSPITAL_COMMUNITY): Payer: Self-pay

## 2021-02-01 ENCOUNTER — Emergency Department (HOSPITAL_COMMUNITY)
Admission: EM | Admit: 2021-02-01 | Discharge: 2021-02-01 | Disposition: A | Discharge status: Home / Self Care | Payer: 59 | Attending: Emergency Medicine | Admitting: Emergency Medicine

## 2021-02-01 ENCOUNTER — Other Ambulatory Visit: Payer: Self-pay

## 2021-02-01 DIAGNOSIS — Z79899 Other long term (current) drug therapy: Secondary | ICD-10-CM | POA: Insufficient documentation

## 2021-02-01 DIAGNOSIS — Y848 Other medical procedures as the cause of abnormal reaction of the patient, or of later complication, without mention of misadventure at the time of the procedure: Secondary | ICD-10-CM | POA: Insufficient documentation

## 2021-02-01 DIAGNOSIS — T83091A Other mechanical complication of indwelling urethral catheter, initial encounter: Secondary | ICD-10-CM | POA: Diagnosis not present

## 2021-02-01 DIAGNOSIS — Z8546 Personal history of malignant neoplasm of prostate: Secondary | ICD-10-CM | POA: Insufficient documentation

## 2021-02-01 DIAGNOSIS — I10 Essential (primary) hypertension: Secondary | ICD-10-CM | POA: Diagnosis not present

## 2021-02-01 DIAGNOSIS — T839XXA Unspecified complication of genitourinary prosthetic device, implant and graft, initial encounter: Secondary | ICD-10-CM

## 2021-02-01 LAB — URINALYSIS, ROUTINE W REFLEX MICROSCOPIC
Bacteria, UA: NONE SEEN
Bilirubin Urine: NEGATIVE
Glucose, UA: NEGATIVE mg/dL
Ketones, ur: NEGATIVE mg/dL
Nitrite: NEGATIVE
Protein, ur: 100 mg/dL — AB
RBC / HPF: >50 RBC/hpf — ABNORMAL HIGH (ref 0–5)
Specific Gravity, Urine: 1.023 (ref 1.005–1.030)
pH: 5 (ref 5.0–8.0)

## 2021-02-01 NOTE — ED Triage Notes (Signed)
Pt reports leaking around catheter that began earlier today. Pt had a prostate procedure done on Monday. Per [facility] we are not to take the catheter out.

## 2021-02-01 NOTE — ED Notes (Signed)
Pts foley catheter irrigated with NS, flushed very easily with no resistance.

## 2021-02-01 NOTE — ED Provider Notes (Signed)
[facility] DEPT Provider Note   CSN: [id number] Arrival date & time: 02/01/21  1922     History No chief complaint on file.   [name] is a 55 y.o. male.  Postop day 3 status post radical prostatectomy.  He is leaking from the penile aspect of his Foley catheter.  He was directed here by urology.  Symptoms started approximately 4 PM today.  The history is provided by the patient.  Male GU Problem Presenting symptoms: no dysuria   Presenting symptoms comment:  Leaking around Foley catheter Context: spontaneously   Relieved by:  Nothing Worsened by:  Nothing Ineffective treatments: flushing catheter in ER. Associated symptoms: abdominal pain (mild, from surgery) and hematuria (tiny clot; no ongoing bleeding)   Associated symptoms: no fever, no nausea and no vomiting       Past Medical History:  Diagnosis Date   Allergy    Anemia    Anxiety    DDD (degenerative disc disease)    GERD (gastroesophageal reflux disease)    Gilbert's syndrome    pt denies dx   Gout    Hyperlipidemia    Hypertension    Low back pain    Prostate cancer ([place])    Tubular adenoma of colon 06/2015    Patient Active Problem List   Diagnosis Date Noted   Prostate cancer ([place]) 12/01/2020   Alopecia 12/01/2020   Morbid obesity ([place]) 74/25/9563   Eosinophilic esophagitis 87/56/4332   Other abnormal glucose (prediabetes) 11/30/2020   Labile hypertension 03/01/2015   Gilbert's syndrome 03/01/2015   Gout 03/01/2015   Medication management 07/27/2014   Vitamin D deficiency 07/27/2014   Obesity (BMI 30.0-34.9) 07/27/2014   Hyperlipidemia    GERD (gastroesophageal reflux disease)    ESOPHAGEAL STRICTURE 04/12/2009    Past Surgical History:  Procedure Laterality Date   CHOLECYSTECTOMY     CHOLESTEATOMA EXCISION     LYMPHADENECTOMY Bilateral 01/29/2021   Procedure: LYMPHADENECTOMY, PELVIC;  Surgeon: Raynelle Bring, MD;  Location: [facility] ORS;  Service:  Urology;  Laterality: Bilateral;   PROSTATE BIOPSY     prostrate     ROBOT ASSISTED LAPAROSCOPIC RADICAL PROSTATECTOMY N/A 01/29/2021   Procedure: XI ROBOTIC ASSISTED LAPAROSCOPIC RADICAL PROSTATECTOMY LEVEL 2;  Surgeon: Raynelle Bring, MD;  Location: [facility] OR[facility];  Service: Urology;  Laterality: N/A;   UPPER GASTROINTESTINAL ENDOSCOPY  2011   VASECTOMY     WISDOM TOOTH EXTRACTION Bilateral        Family History  Problem Relation Age of Onset   Lung cancer Mother    Diabetes Father    Hypertension Father    Breast cancer Other    Rectal cancer Other    Prostate cancer Other    Pancreatic cancer Other    Colon cancer Neg Hx    Esophageal cancer Neg Hx     Social History   Tobacco Use   Smoking status: Never   Smokeless tobacco: Never  Vaping Use   Vaping Use: Never used  Substance Use Topics   Alcohol use: Yes    Alcohol/week: 0.0 standard drinks    Comment: social   Drug use: No    Home Medications Prior to Admission medications   Medication Sig Start Date End Date Taking? Authorizing Provider  allopurinol (ZYLOPRIM) 300 MG tablet TAKE 1 TABLET DAILY TO PREVENT GOUT 12/04/19   Unk Pinto, MD  cetirizine (ZYRTEC) 10 MG tablet Take 10 mg by mouth daily.    [provider]  colchicine 0.6  MG tablet Take two tablets (1.2mg ) at onset of gout symptoms.  Then take one tablet one hour later x1. 06/30/18   Garnet Sierras, NP  docusate sodium (COLACE) 100 MG capsule Take 1 capsule (100 mg total) by mouth 2 (two) times daily. 01/29/21   Dancy, Estill Bamberg, PA-C  FLOVENT HFA 220 MCG/ACT inhaler SWALLOW, DO NOT INHALE, 2 PUFFS TWICE A DAY. DO NOT EAT OR DRINK FOR 30 MINUTES AFTER USE. RINSE MOUTH WELL AFTER USE. 12/29/20   Liane Comber, NP  Minoxidil (MINOXIDIL FOR MEN) 5 % FOAM Apply to area of thinning hair twice daily. Rinse hands and other skin areas after use. 12/01/20   Liane Comber, NP  pantoprazole (PROTONIX) 40 MG tablet Take 1 tablet (40 mg total) by mouth daily.  10/20/20   Ladene Artist, MD  sulfamethoxazole-trimethoprim (BACTRIM DS) 800-160 MG tablet Take 1 tablet by mouth 2 (two) times daily. Start the day prior to foley removal appointment 01/29/21   Debbrah Alar, PA-C  traMADol (ULTRAM) 50 MG tablet Take 1-2 tablets (50-100 mg total) by mouth every 6 (six) hours as needed for moderate pain or severe pain. 01/29/21   Debbrah Alar, PA-C    Allergies    Patient has no known allergies.  Review of Systems   Review of Systems  Constitutional:  Negative for chills and fever.  HENT:  Negative for ear pain and sore throat.   Eyes:  Negative for pain and visual disturbance.  Respiratory:  Negative for cough and shortness of breath.   Cardiovascular:  Negative for chest pain and palpitations.  Gastrointestinal:  Positive for abdominal pain (mild, from surgery). Negative for nausea and vomiting.  Genitourinary:  Positive for hematuria (tiny clot; no ongoing bleeding). Negative for dysuria.  Musculoskeletal:  Negative for arthralgias and back pain.  Skin:  Negative for color change and rash.  Neurological:  Negative for seizures and syncope.  All other systems reviewed and are negative.  Physical Exam Updated Vital Signs BP (!) 120/95   Pulse (!) 110   Temp 98.9 F (37.2 C) (Oral)   Resp 18   Ht 6\' 2"  (1.88 m)   Wt 122.9 kg   SpO2 100%   BMI 34.79 kg/m   Physical Exam Vitals and nursing note reviewed.  Constitutional:      Appearance: Normal appearance.  HENT:     Head: Normocephalic and atraumatic.  Eyes:     Conjunctiva/sclera: Conjunctivae normal.  Pulmonary:     Effort: Pulmonary effort is normal. No respiratory distress.  Abdominal:     Comments: Laparoscopic port scars are clean, dry, and intact without surrounding cellulitis  Genitourinary:    Comments: Catheter dripping urine.  Urine in bag is yellow. Musculoskeletal:        General: No deformity. Normal range of motion.     Cervical back: Normal range of motion.  Skin:     General: Skin is warm and dry.  Neurological:     General: No focal deficit present.     Mental Status: He is alert and oriented to person, place, and time. Mental status is at baseline.  Psychiatric:        Mood and Affect: Mood normal.    ED Results / Procedures / Treatments   Labs (all labs ordered are listed, but only abnormal results are displayed) Labs Reviewed  URINALYSIS, ROUTINE W REFLEX MICROSCOPIC - Abnormal; Notable for the following components:      Result Value   APPearance HAZY (*)  Hgb urine dipstick LARGE (*)    Protein, ur 100 (*)    Leukocytes,Ua SMALL (*)    RBC / HPF >50 (*)    All other components within normal limits    EKG None  Radiology No results found.  Procedures Procedures   Medications Ordered in ED Medications - No data to display  ED Course  I have reviewed the triage vital signs and the nursing notes.  Pertinent labs & imaging results that were available during my care of the patient were reviewed by me and considered in my medical decision making (see chart for details).  Clinical Course as of 02/01/21 2259  Thu Feb 01, 2021  2258 I spoke with Dr. Milford Cage (urology). Likely bladder spasms if catheter is draining. Patient can take Detrol LA if he wants to try it, but side effects of dry mouth and constipation are possible. [AW]    Clinical Course User Index [AW] Arnaldo Natal, MD   MDM Rules/Calculators/A&P                           Jeannie Fend presented to the emergency department with a leaking Foley catheter.  The patient declined Detrol LA.  He is already dealing with some mild constipation.  He was reassured.  He will call his urologist as needed.  I did notice that he was a little bit tachycardic.  He was complaining of some mild abdominal pain and did plan to go home and take his pain medication. Final Clinical Impression(s) / ED Diagnoses Final diagnoses:  Problem with Foley catheter, initial encounter [facility]     Rx / DC Orders ED Discharge Orders     None        Arnaldo Natal, MD 02/01/21 [id number]

## 2021-02-01 NOTE — ED Provider Notes (Signed)
Emergency Medicine Provider Triage Evaluation Note  [name] , a 55 y.o. male  was evaluated in triage.  Pt complains of leaking around his Foley catheter.  Patient notes he has had some urine leaking around foley cath insertion site.  He had prostate surgery on Monday.  States earlier today he noticed a small clot of blood in his Foley catheter.  He tried replacing the bag with a significant improvement in drainage, feels like there is less drainage in bag.  He denies any abdominal pain, sensation of needing to use the restroom, prepubic pressure.  He called the on call urology physician who told him to come here to have his Foley catheter flushed.  Per patient, Urology told him under no circumstances can his Foley catheter be removed and replaced. Review of Systems  Positive: Foley cath problem Negative: Abd pain, emesis  Physical Exam  BP 118/81 (BP Location: Left Arm)   Pulse (!) 115   Temp 98.9 F (37.2 C) (Oral)   Resp 17   Ht 6\' 2"  (1.88 m)   Wt 122.9 kg   SpO2 97%   BMI 34.79 kg/m  Gen:   Awake, no distress   Resp:  Normal effort  MSK:   Moves extremities without difficulty  Other:  Small amount of urine in foley bag  Medical Decision Making  Medically screening exam initiated at 7:45 PM.  Appropriate orders placed.  [name] was informed that the remainder of the evaluation will be completed by another provider, this initial triage assessment does not replace that evaluation, and the importance of remaining in the ED until their evaluation is complete.  Foley cath not draining    Lameisha Schuenemann A, PA-C 02/01/21 2001    Drenda Freeze, MD 02/05/21 [id number]

## 2021-02-02 LAB — SURGICAL PATHOLOGY

## 2021-03-20 ENCOUNTER — Other Ambulatory Visit: Payer: Self-pay | Admitting: Internal Medicine

## 2021-03-20 DIAGNOSIS — M1A9XX0 Chronic gout, unspecified, without tophus (tophi): Secondary | ICD-10-CM

## 2021-03-28 ENCOUNTER — Ambulatory Visit (INDEPENDENT_AMBULATORY_CARE_PROVIDER_SITE_OTHER): Payer: 59 | Admitting: Gastroenterology

## 2021-03-28 ENCOUNTER — Encounter: Payer: Self-pay | Admitting: Gastroenterology

## 2021-03-28 VITALS — BP 128/76 | HR 74 | Ht 74.0 in | Wt 270.0 lb

## 2021-03-28 DIAGNOSIS — Z86010 Personal history of colonic polyps: Secondary | ICD-10-CM | POA: Diagnosis not present

## 2021-03-28 DIAGNOSIS — K200 Eosinophilic esophagitis: Secondary | ICD-10-CM | POA: Diagnosis not present

## 2021-03-28 NOTE — Patient Instructions (Signed)
Discontinue your inhaler.  Continue pantoprazole daily.   Thank you for choosing me and [facility].  [name]. Dagoberto Ligas., MD., Marval Regal

## 2021-03-28 NOTE — Progress Notes (Signed)
    History of Present Illness: This is a 55 year old male with eosinophilic esophagitis.  He is status post EGD with dilation in April 2022.  Recently he has been taking his Flovent inhaler intermittently and has remained on pantoprazole daily.  He has no upper gastrointestinal complaints including no dysphagia.  He underwent a robotic assisted prostatectomy on July 18 and has made a good recovery.  His abdominal incisions are healing well.  Current Medications, Allergies, Past Medical History, Past Surgical History, Family History and Social History were reviewed in [employer] record.   Physical Exam: General: Well developed, well nourished, no acute distress Head: Normocephalic and atraumatic Eyes: Sclerae anicteric, EOMI Ears: Normal auditory acuity Mouth: Not examined, mask on during Covid-19 pandemic Lungs: Clear throughout to auscultation Heart: Regular rate and rhythm; no murmurs, rubs or bruits Abdomen: Soft, non tender and non distended. No masses, hepatosplenomegaly or hernias noted. Normal Bowel sounds Rectal: Not done Musculoskeletal: Symmetrical with no gross deformities  Pulses:  Normal pulses noted Extremities: No clubbing, cyanosis, edema or deformities noted Neurological: Alert oriented x 4, grossly nonfocal Psychological:  Alert and cooperative. Normal mood and affect   Assessment and Recommendations:  Eosinophilic esophagitis.  Symptoms well controlled.  Discontinue Flovent and continue pantoprazole 40 mg daily.  If dysphagia returns he is advised to call otherwise we will plan for a REV in 1 year. 2.   Personal history of adenomatous colon polyps.  A 7-year interval surveillance colonoscopy is recommended which will be due in December 2023. 3.   Prostate cancer status post robotic assisted prostatectomy.  Patient relates radiation therapy may be recommended.  Follow-up with his urologist is planned in October.

## 2021-05-04 ENCOUNTER — Other Ambulatory Visit (HOSPITAL_COMMUNITY): Payer: Self-pay | Admitting: Urology

## 2021-05-04 DIAGNOSIS — C61 Malignant neoplasm of prostate: Secondary | ICD-10-CM

## 2021-05-10 ENCOUNTER — Other Ambulatory Visit: Payer: Self-pay

## 2021-05-10 ENCOUNTER — Encounter (HOSPITAL_COMMUNITY)
Admission: RE | Admit: 2021-05-10 | Discharge: 2021-05-10 | Disposition: A | Discharge status: Home / Self Care | Payer: 59 | Source: Ambulatory Visit | Attending: Urology | Admitting: Urology

## 2021-05-10 DIAGNOSIS — C61 Malignant neoplasm of prostate: Secondary | ICD-10-CM | POA: Diagnosis present

## 2021-05-10 MED ORDER — PIFLIFOLASTAT F 18 (PYLARIFY) INJECTION
9.0000 | Freq: Once | INTRAVENOUS | Status: AC
  Administered 2021-05-10: 9.7 via INTRAVENOUS

## 2021-05-11 NOTE — Progress Notes (Signed)
I called pt to introduce myself as the Prostate Nurse Navigator and the Coordinator of the Prostate [place].   1. I confirmed with the patient he is aware of his referral to the clinic 11/11, arriving @ 08:00am.    2. I discussed the format of the clinic and the physicians he will be seeing that day.   3. I discussed where the clinic is located and how to contact me.   4. I confirmed his address and informed him I would be mailing a packet of information and forms to be completed. I asked him to bring them with him the day of his appointment.    He voiced understanding of the above. I asked him to call me if he has any questions or concerns regarding his appointments or the forms he needs to complete.

## 2021-05-15 NOTE — Progress Notes (Signed)
Complete Physical  Assessment and Plan:  Encounter for general adult medical examination with abnormal findings 1 year  Labile hypertension - controlled without medication, DASH diet, exercise and monitor at home. Call if greater than 130/80.  -     CBC with Differential/Platelet -    CMP -     TSH -     Urinalysis, Routine w reflex microscopic -     Microalbumin / creatinine urine ratio -     EKG  Hyperlipidemia, unspecified hyperlipidemia type -continue medications, check lipids, decrease fatty foods, increase activity.  -     Lipid panel - CMP  ESOPHAGEAL STRICTURE Continue PPI/H2 blocker, diet discussed  Prediabetes Discussed general issues about diabetes pathophysiology and management., Educational material distributed., Suggested low cholesterol diet., Encouraged aerobic exercise., Discussed foot care., Reminded to get yearly retinal exam. -     Hemoglobin A1c  Vitamin D deficiency -     VITAMIN D 25 Hydroxy (Vit-D Deficiency, Fractures)  Gilbert's syndrome -     CMP  Severe Obesity BMI 35 with comorbidity Overweight Long discussion about weight loss, diet, and exercise Recommended diet heavy in fruits and veggies and low in animal meats, cheeses, and dairy products, appropriate calorie intake Follow up at next visit   Chronic gout without tophus, unspecified cause, unspecified site Gout- recheck Uric acid as needed, Diet discussed, continue medications. -     Uric acid  Insomnia, unspecified type Insomnia- good sleep hygiene discussed, increase day time activity, try melatonin or benadryl if this does not help we will call in sleep medication.   Anxiety-  continue medications, stress management techniques discussed, increase water, good sleep hygiene discussed, increase exercise, and increase veggies.   Gastroesophageal reflux disease, esophagitis presence not specified/EOSINOPHILIC ESOPHAGITIS Continue PPI/H2 blocker, diet discussed  Medication  management -     Magnesium  Seasonal Allergies Switch from Zyrtec to Allegra to determine if this helps symptoms  Alopecia Continue Minoxidil foam  Prostates Cancer Continue to follow with Dr. Tammi Klippel and Dr. Alinda Money  Future Appointments  Date Time Provider [place]  05/25/2021  8:00 AM Tyler Pita, MD [facility] None  05/17/2022 10:00 AM Magda Bernheim, NP [facility] None     Discussed med's effects and SE's. Screening labs and tests as requested with regular follow-up as recommended.  HPI Patient presents for a complete physical.   He is working from home.   Has had prostate cancer- removed the prostate 01/29/21, margins were not clear. PET scan last week which was clear. Has follow up with Dr. Alinda Money next week.  He is taking Cetirizine (Zyrtec) every morning.  Reports that he is having some itching in his ears over the course of the last three to four week.  It is bilateral.  Denies any popping cracking, change in hearing or dizziness. Has been on this same medication for several years  Reports he is starting to have an increase in difficultly swallowing with the first few bites of his food.  Reports he is not having any difficulties with liquids.    His blood pressure has been controlled at home, today their BP is BP: 112/70 He does not workout. He denies chest pain, shortness of breath, dizziness. He was seen in 2010, [place] by Dr [name]. Will place referral for evaluation.  BMI is Body mass index is 35.72 kg/m. Has not been working diet and exercise.  Wt Readings from Last 3 Encounters:  05/17/21 278 lb 3.2 oz (126.2 kg)  03/28/21 270 lb (122.5 kg)  02/01/21 271 lb (122.9 kg)   He is on cholesterol medication and denies myalgias. His cholesterol is at goal. The cholesterol last visit was:   Lab Results  Component Value Date   CHOL 166 12/01/2020   HDL 34 (L) 12/01/2020   LDLCALC 101 (H) 12/01/2020   TRIG 194 (H) 12/01/2020   CHOLHDL 4.9  12/01/2020   He has been working on diet and exercise for prediabetes, he is on bASA, he is not on ACE/ARB and denies foot ulcerations, hyperglycemia, hypoglycemia , increased appetite, nausea, paresthesia of the feet, polydipsia, polyuria, visual disturbances, vomiting and weight loss. Last A1C in the office was:  Lab Results  Component Value Date   HGBA1C 6.0 (H) 12/01/2020   Patient is on Vitamin D supplement, 5,000 IU a day.   Lab Results  Component Value Date   VD25OH 69 03/18/2019   Patient is on allopurinol for gout and does not report a recent flare x 2 years.  Lab Results  Component Value Date   LABURIC 5.6 03/18/2019     He is following with urology at this time and last results were in normal.     Current Medications:  Current Outpatient Medications on File Prior to Visit  Medication Sig Dispense Refill   allopurinol (ZYLOPRIM) 300 MG tablet TAKE 1 TABLET BY MOUTH DAILY TO PREVENT GOUT 90 tablet 1   cetirizine (ZYRTEC) 10 MG tablet Take 10 mg by mouth daily.     colchicine 0.6 MG tablet Take two tablets (1.2mg ) at onset of gout symptoms.  Then take one tablet one hour later x1. 30 tablet 2   Minoxidil (MINOXIDIL FOR MEN) 5 % FOAM Apply to area of thinning hair twice daily. Rinse hands and other skin areas after use. 60 g 1   pantoprazole (PROTONIX) 40 MG tablet Take 1 tablet (40 mg total) by mouth daily. 90 tablet 4   No current facility-administered medications on file prior to visit.    Health Maintenance:  Immunization History  Administered Date(s) Administered   Influenza Inj Mdck Quad Pf 04/12/2018   Influenza,inj,Quad PF,6+ Mos 04/05/2019   Influenza-Unspecified 04/12/2018   Pneumococcal-Unspecified 11/06/1995   Td 11/01/2004   Tdap 03/01/2015   Tetanus: 2016 Influenza: get in Oct Colonoscopy: 2016 due 5 years Dr. [name] , was to have one 10/2020, did prep but could not get colonoscopy due to PHENTERMINE Eye Exam: Due 2021 Dentist:  04/2020,  Q8months  Patient Care Team: Unk Pinto, MD as PCP - General (Internal Medicine) [name], MD as Attending Physician (Urology) Ladene Artist, MD as Consulting Physician (Gastroenterology) Kary Kos, MD as Consulting Physician (Neurosurgery) Katheren Puller, RN as Oncology Nurse Navigator  Medical History:  Past Medical History:  Diagnosis Date   Allergy    Anemia    Anxiety    DDD (degenerative disc disease)    GERD (gastroesophageal reflux disease)    Gilbert's syndrome    pt denies dx   Gout    Hyperlipidemia    Hypertension    Low back pain    Prostate cancer ([place])    Tubular adenoma of colon 06/2015    Allergies No Known Allergies  SURGICAL HISTORY He  has a past surgical history that includes Wisdom tooth extraction (Bilateral); Cholesteatoma excision; Vasectomy; Cholecystectomy; prostrate (01/2021); Prostate biopsy; Upper gastrointestinal endoscopy (07/15/2009); Robot assisted laparoscopic radical prostatectomy (N/A, 01/29/2021); and Lymphadenectomy (Bilateral, 01/29/2021). FAMILY HISTORY His family history includes Diabetes in his father; Hypertension  in his father; Lung cancer in his mother. SOCIAL HISTORY He  reports that he has never smoked. He has never used smokeless tobacco. He reports current alcohol use. He reports that he does not use drugs.   Review of Systems:  Review of Systems  Constitutional:  Negative for chills and fever.  HENT:  Negative for congestion, hearing loss, sinus pain, sore throat and tinnitus.        Itching of ears  Eyes:  Negative for blurred vision and double vision.  Respiratory:  Negative for cough, hemoptysis, sputum production, shortness of breath and wheezing.   Cardiovascular:  Negative for chest pain, palpitations and leg swelling.  Gastrointestinal:  Negative for abdominal pain, constipation, diarrhea, heartburn, nausea and vomiting.  Genitourinary:  Negative for dysuria and urgency.       Minimal leakage of urine  since prostatectomy  Musculoskeletal:  Negative for back pain, falls, joint pain, myalgias and neck pain.  Skin:  Negative for rash.  Neurological:  Negative for dizziness, tingling, tremors, weakness and headaches.  Endo/Heme/Allergies:  Does not bruise/bleed easily.  Psychiatric/Behavioral:  Negative for depression and suicidal ideas. The patient is not nervous/anxious and does not have insomnia.    Physical Exam: Estimated body mass index is 35.72 kg/m as calculated from the following:   Height as of this encounter: 6\' 2"  (1.88 m).   Weight as of this encounter: 278 lb 3.2 oz (126.2 kg). BP 112/70   Pulse 70   Temp 97.7 F (36.5 C)   Ht 6\' 2"  (1.88 m)   Wt 278 lb 3.2 oz (126.2 kg)   SpO2 98%   BMI 35.72 kg/m   General Appearance: Well nourished, in no apparent distress.  Eyes: PERRLA, EOMs, conjunctiva no swelling or erythema ENT/Mouth: Ear canals clear bilaterally with no erythema, swelling, discharge.  TMs normal bilaterally with no erythema, bulging, or retractions.  Oropharynx clear and moist with no exudate, swelling, or erythema. Scalloping of tongue.  Dentition normal.   Neck: Supple, thyroid normal. No bruits, JVD, cervical adenopathy Respiratory: Respiratory effort normal, BS equal bilaterally without rales, rhonchi, wheezing or stridor.  Cardio: RRR without murmurs, rubs or gallops. Brisk peripheral pulses without edema.  Chest: symmetric, with normal excursions Abdomen: Soft, nontender, no guarding, rebound, hernias, masses, or organomegaly. Genitourinary: Deferred to urology Musculoskeletal: Full ROM all peripheral extremities,5/5 strength, and normal gait.  Skin:  Warm, dry without rashes, lesions, ecchymosis. Neuro: A&Ox3, Cranial nerves intact, reflexes equal bilaterally. Normal muscle tone, no cerebellar symptoms. Sensation intact.  Psych: Normal affect, Insight and Judgment appropriate.   EKG: WNL no changes.   Yanisa Goodgame W Tyeson [place] 10:27 AM [facility]

## 2021-05-17 ENCOUNTER — Encounter: Payer: Self-pay | Admitting: Nurse Practitioner

## 2021-05-17 ENCOUNTER — Ambulatory Visit: Payer: 59 | Admitting: Nurse Practitioner

## 2021-05-17 ENCOUNTER — Other Ambulatory Visit: Payer: Self-pay

## 2021-05-17 VITALS — BP 112/70 | HR 70 | Temp 97.7°F | Ht 74.0 in | Wt 278.2 lb

## 2021-05-17 DIAGNOSIS — M1A9XX0 Chronic gout, unspecified, without tophus (tophi): Secondary | ICD-10-CM

## 2021-05-17 DIAGNOSIS — K219 Gastro-esophageal reflux disease without esophagitis: Secondary | ICD-10-CM

## 2021-05-17 DIAGNOSIS — Z0000 Encounter for general adult medical examination without abnormal findings: Secondary | ICD-10-CM | POA: Diagnosis not present

## 2021-05-17 DIAGNOSIS — C61 Malignant neoplasm of prostate: Secondary | ICD-10-CM

## 2021-05-17 DIAGNOSIS — F419 Anxiety disorder, unspecified: Secondary | ICD-10-CM

## 2021-05-17 DIAGNOSIS — Z0001 Encounter for general adult medical examination with abnormal findings: Secondary | ICD-10-CM

## 2021-05-17 DIAGNOSIS — N528 Other male erectile dysfunction: Secondary | ICD-10-CM

## 2021-05-17 DIAGNOSIS — J302 Other seasonal allergic rhinitis: Secondary | ICD-10-CM

## 2021-05-17 DIAGNOSIS — L659 Nonscarring hair loss, unspecified: Secondary | ICD-10-CM

## 2021-05-17 DIAGNOSIS — R0989 Other specified symptoms and signs involving the circulatory and respiratory systems: Secondary | ICD-10-CM

## 2021-05-17 DIAGNOSIS — K200 Eosinophilic esophagitis: Secondary | ICD-10-CM

## 2021-05-17 DIAGNOSIS — Z1211 Encounter for screening for malignant neoplasm of colon: Secondary | ICD-10-CM

## 2021-05-17 DIAGNOSIS — E559 Vitamin D deficiency, unspecified: Secondary | ICD-10-CM

## 2021-05-17 DIAGNOSIS — Z136 Encounter for screening for cardiovascular disorders: Secondary | ICD-10-CM | POA: Diagnosis not present

## 2021-05-17 DIAGNOSIS — E785 Hyperlipidemia, unspecified: Secondary | ICD-10-CM

## 2021-05-17 DIAGNOSIS — G4700 Insomnia, unspecified: Secondary | ICD-10-CM

## 2021-05-17 DIAGNOSIS — Z1389 Encounter for screening for other disorder: Secondary | ICD-10-CM

## 2021-05-17 DIAGNOSIS — E669 Obesity, unspecified: Secondary | ICD-10-CM

## 2021-05-17 DIAGNOSIS — R7309 Other abnormal glucose: Secondary | ICD-10-CM

## 2021-05-17 NOTE — Patient Instructions (Signed)

## 2021-05-18 LAB — MICROALBUMIN / CREATININE URINE RATIO
Creatinine, Urine: 103 mg/dL (ref 20–320)
Microalb Creat Ratio: 4 mcg/mg creat (ref <30)
Microalb, Ur: 0.4 mg/dL

## 2021-05-18 LAB — URINALYSIS, ROUTINE W REFLEX MICROSCOPIC
Bilirubin Urine: NEGATIVE
Glucose, UA: NEGATIVE
Hgb urine dipstick: NEGATIVE
Ketones, ur: NEGATIVE
Leukocytes,Ua: NEGATIVE
Nitrite: NEGATIVE
Protein, ur: NEGATIVE
Specific Gravity, Urine: 1.013 (ref 1.001–1.035)
pH: 6.5 (ref 5.0–8.0)

## 2021-05-18 LAB — COMPLETE METABOLIC PANEL WITHOUT GFR
AG Ratio: 1.8 (calc) (ref 1.0–2.5)
ALT: 15 U/L (ref 9–46)
AST: 16 U/L (ref 10–35)
Albumin: 4.1 g/dL (ref 3.6–5.1)
Alkaline phosphatase (APISO): 87 U/L (ref 35–144)
BUN: 11 mg/dL (ref 7–25)
CO2: 30 mmol/L (ref 20–32)
Calcium: 9.4 mg/dL (ref 8.6–10.3)
Chloride: 105 mmol/L (ref 98–110)
Creat: 1.06 mg/dL (ref 0.70–1.30)
Globulin: 2.3 g/dL (ref 1.9–3.7)
Glucose, Bld: 95 mg/dL (ref 65–99)
Potassium: 4.7 mmol/L (ref 3.5–5.3)
Sodium: 141 mmol/L (ref 135–146)
Total Bilirubin: 0.3 mg/dL (ref 0.2–1.2)
Total Protein: 6.4 g/dL (ref 6.1–8.1)
eGFR: 82 mL/min/{1.73_m2}

## 2021-05-18 LAB — CBC WITH DIFFERENTIAL/PLATELET
Absolute Monocytes: 623 cells/uL (ref 200–950)
Basophils Absolute: 53 cells/uL (ref 0–200)
Basophils Relative: 0.7 %
Eosinophils Absolute: 350 cells/uL (ref 15–500)
Eosinophils Relative: 4.6 %
HCT: 42.3 % (ref 38.5–50.0)
Hemoglobin: 13.9 g/dL (ref 13.2–17.1)
Lymphs Abs: 1778 cells/uL (ref 850–3900)
MCH: 28.1 pg (ref 27.0–33.0)
MCHC: 32.9 g/dL (ref 32.0–36.0)
MCV: 85.6 fL (ref 80.0–100.0)
MPV: 9.9 fL (ref 7.5–12.5)
Monocytes Relative: 8.2 %
Neutro Abs: 4796 cells/uL (ref 1500–7800)
Neutrophils Relative %: 63.1 %
Platelets: 250 10*3/uL (ref 140–400)
RBC: 4.94 10*6/uL (ref 4.20–5.80)
RDW: 13.6 % (ref 11.0–15.0)
Total Lymphocyte: 23.4 %
WBC: 7.6 10*3/uL (ref 3.8–10.8)

## 2021-05-18 LAB — HEMOGLOBIN A1C
Hgb A1c MFr Bld: 6 % of total Hgb — ABNORMAL HIGH (ref <5.7)
Mean Plasma Glucose: 126 mg/dL
eAG (mmol/L): 7 mmol/L

## 2021-05-18 LAB — URIC ACID: Uric Acid, Serum: 5 mg/dL (ref 4.0–8.0)

## 2021-05-18 LAB — TSH: TSH: 2.27 mIU/L (ref 0.40–4.50)

## 2021-05-18 LAB — LIPID PANEL
Cholesterol: 165 mg/dL (ref <200)
HDL: 36 mg/dL — ABNORMAL LOW (ref >=40)
LDL Cholesterol (Calc): 102 mg/dL (calc) — ABNORMAL HIGH
Non-HDL Cholesterol (Calc): 129 mg/dL (calc) (ref <130)
Total CHOL/HDL Ratio: 4.6 (calc) (ref <5.0)
Triglycerides: 176 mg/dL — ABNORMAL HIGH (ref <150)

## 2021-05-18 LAB — VITAMIN D 25 HYDROXY (VIT D DEFICIENCY, FRACTURES): Vit D, 25-Hydroxy: 41 ng/mL (ref 30–100)

## 2021-05-24 ENCOUNTER — Other Ambulatory Visit: Payer: Self-pay

## 2021-05-24 NOTE — Progress Notes (Signed)
RN reviewed with patient medication, allergies, and pharmacy.

## 2021-05-25 ENCOUNTER — Other Ambulatory Visit: Payer: Self-pay | Admitting: Genetic Counselor

## 2021-05-25 ENCOUNTER — Ambulatory Visit (HOSPITAL_BASED_OUTPATIENT_CLINIC_OR_DEPARTMENT_OTHER): Payer: 59 | Admitting: Genetic Counselor

## 2021-05-25 ENCOUNTER — Inpatient Hospital Stay: Payer: 59

## 2021-05-25 ENCOUNTER — Inpatient Hospital Stay: Payer: 59 | Attending: Oncology | Admitting: Oncology

## 2021-05-25 ENCOUNTER — Telehealth: Payer: Self-pay | Admitting: *Deleted

## 2021-05-25 ENCOUNTER — Ambulatory Visit
Admission: RE | Admit: 2021-05-25 | Discharge: 2021-05-25 | Disposition: A | Discharge status: Home / Self Care | Payer: 59 | Source: Ambulatory Visit | Attending: Radiation Oncology | Admitting: Radiation Oncology

## 2021-05-25 ENCOUNTER — Other Ambulatory Visit: Payer: Self-pay

## 2021-05-25 ENCOUNTER — Encounter: Payer: Self-pay | Admitting: General Practice

## 2021-05-25 VITALS — BP 123/72 | HR 69 | Temp 97.6°F | Resp 20 | Ht 74.0 in | Wt 277.4 lb

## 2021-05-25 DIAGNOSIS — Z9079 Acquired absence of other genital organ(s): Secondary | ICD-10-CM | POA: Insufficient documentation

## 2021-05-25 DIAGNOSIS — C61 Malignant neoplasm of prostate: Secondary | ICD-10-CM

## 2021-05-25 DIAGNOSIS — R9721 Rising PSA following treatment for malignant neoplasm of prostate: Secondary | ICD-10-CM | POA: Diagnosis not present

## 2021-05-25 DIAGNOSIS — Z1379 Encounter for other screening for genetic and chromosomal anomalies: Secondary | ICD-10-CM

## 2021-05-25 LAB — GENETIC SCREENING ORDER

## 2021-05-25 NOTE — Progress Notes (Signed)
[place] Psychosocial Distress Screening Spiritual Care  Met with Jasher and his dad in [facility] to introduce [place] team/resources, reviewing distress screen per protocol.  The patient scored a 1 on the Psychosocial Distress Thermometer which indicates mild distress. Also assessed for distress and other psychosocial needs.   ONCBCN DISTRESS SCREENING 05/25/2021  Screening Type Change in Status  Distress experienced in past week (1-10) 1  Emotional problem type   Referral to financial advocate   Referral to support programs Yes   Mr Dolce reports good support from family and little distress at this time. He is aware of [place] support programming, including Prostate Cancer Support Group.   Follow up needed: No. Mr Scaife knows to contact Team as needed/desired.   [place], [place], [facility] Pager [phone] Voicemail [phone]

## 2021-05-25 NOTE — Telephone Encounter (Signed)
CALLED PATIENT TO INFORM OF ADT APPT. FOR 06-04-21- ARRIVAL TIME- 2:45 PM @ [facility], SPOKE WITH PATIENT AND HE IS AWARE OF THIS APPT.

## 2021-05-25 NOTE — Progress Notes (Addendum)
REFERRING PROVIDER: Zola Button, MD [address] [place], [place] [address]  PRIMARY PROVIDER:  Unk Pinto, MD  PRIMARY REASON FOR VISIT:  Encounter Diagnosis  Name Primary?   Prostate cancer ([place]) Yes    HISTORY OF PRESENT ILLNESS:   [name], a 55 y.o. male, was seen for a [place] cancer genetics consultation at the request of Dr. Alen Richardson due to a personal history of cancer.  [name] presents to clinic today to discuss the possibility of a hereditary predisposition to cancer, to discuss genetic testing, and to further clarify his future cancer risks, as well as potential cancer risks for family members.   Prostate cancer diagnosed in May 2022.  He was initially found to have Gleason score 4+3 = 7 on initial biopsy and subsequently found to have a Gleason score 4+ 5 = 9 radical prostatectomy.  CANCER HISTORY:  Oncology History  Prostate cancer ([place])  12/01/2020 Initial Diagnosis   Prostate cancer ([place])   12/22/2020 Cancer Staging   Staging form: Prostate, AJCC 8th Edition - Clinical: Stage IIC (cT1c, cN0, cM0, PSA: 4.2, Grade Group: 3) - Signed by Jack Portela, MD on 12/22/2020 Prostate specific antigen (PSA) range: Less than 10 Gleason score: 7 Histologic grading system: 5 grade system    01/29/2021 Cancer Staging   Staging form: Prostate, AJCC 8th Edition - Pathologic stage from 01/29/2021: Stage IIIC (pT3a, pN0, cM0, PSA: 4.2, Grade Group: 5) - Signed by Jack Caldron, PA-C on 05/25/2021 Histopathologic type: Adenocarcinoma, NOS Stage prefix: Initial diagnosis Prostate specific antigen (PSA) range: Less than 10 Gleason primary pattern: 4 Gleason secondary pattern: 5 Gleason score: 9 Histologic grading system: 5 grade system Residual tumor (R): R1 - Microscopic       Past Medical History:  Diagnosis Date   Allergy    Anemia    Anxiety    DDD (degenerative disc disease)    GERD (gastroesophageal reflux disease)    Gilbert's syndrome     pt denies dx   Gout    Hyperlipidemia    Hypertension    Low back pain    Prostate cancer ([place])    Tubular adenoma of colon 06/2015    Past Surgical History:  Procedure Laterality Date   CHOLECYSTECTOMY     CHOLESTEATOMA EXCISION     LYMPHADENECTOMY Bilateral 01/29/2021   Procedure: LYMPHADENECTOMY, PELVIC;  Surgeon: Jack Bring, MD;  Location: [facility];  Service: Urology;  Laterality: Bilateral;   PROSTATE BIOPSY     prostrate  01/2021   ROBOT ASSISTED LAPAROSCOPIC RADICAL PROSTATECTOMY N/A 01/29/2021   Procedure: XI ROBOTIC ASSISTED LAPAROSCOPIC RADICAL PROSTATECTOMY LEVEL 2;  Surgeon: Jack Bring, MD;  Location: [facility];  Service: Urology;  Laterality: N/A;   UPPER GASTROINTESTINAL ENDOSCOPY  07/15/2009   VASECTOMY     WISDOM TOOTH EXTRACTION Bilateral     Social History   Socioeconomic History   Marital status: Divorced    Spouse name: Not on file   Number of children: 2   Years of education: Not on file   Highest education level: Not on file  Occupational History    Employer: [employer]   Occupation: Pharmacist, hospital: [employer]  Tobacco Use   Smoking status: Never   Smokeless tobacco: Never  Vaping Use   Vaping Use: Never used  Substance and Sexual Activity   Alcohol use: Yes    Alcohol/week: 0.0 standard drinks    Comment: social   Drug use: No   Sexual activity:  Not Currently  Other Topics Concern   Not on file  Social History Narrative   1 son and 1 daughter.   Social Determinants of Health   Financial Resource Strain: Not on file  Food Insecurity: Not on file  Transportation Needs: Not on file  Physical Activity: Not on file  Stress: Not on file  Social Connections: Not on file     FAMILY HISTORY:  We obtained a detailed, 4-generation family history.  Significant diagnoses are listed below:    [name] is unaware of previous family history of genetic testing for hereditary cancer risks. His mother was diagnosed with  lung cancer in her 56s, she smoked and is deceased.   GENETIC COUNSELING ASSESSMENT: [name] is a 55 y.o. male with a personal history of cancer which is somewhat suggestive of a hereditary predisposition to cancer given his high risk prostate cancer. We, therefore, discussed and recommended the following at today's visit.   DISCUSSION: We discussed that 5 - 10% of cancer is hereditary, with most cases of prostate cancer associated with BRCA1/2.  There are other genes that can be associated with hereditary prostate cancer syndromes.  We discussed that testing is beneficial for several reasons including knowing how to follow individuals after completing their treatment, identifying whether potential treatment options would be beneficial, and understanding if other family members could be at risk for cancer and allowing them to undergo genetic testing.   We reviewed the characteristics, features and inheritance patterns of hereditary cancer syndromes. We also discussed genetic testing, including the appropriate family members to test, the process of testing, insurance coverage and turn-around-time for results. We discussed the implications of a negative, positive, carrier and/or variant of uncertain significant result. We recommended [name] pursue genetic testing for a panel that includes genes associated with prostate cancer. [name] opted to pursue [facility] CustomNext Panel (47 genes).  The CustomNext-Cancer+RNAinsight panel offered by [name] includes sequencing and rearrangement analysis for the following 47 genes:  APC, ATM, AXIN2, BARD1, BMPR1A, BRCA1, BRCA2, BRIP1, CDH1, CDK4, CDKN2A, CHEK2, CTNNA1, DICER1, EPCAM, GREM1, HOXB13, KIT, MEN1, MLH1, MSH2, MSH3, MSH6, MUTYH, NBN, NF1, NTHL1, PALB2, PDGFRA, PMS2, POLD1, POLE, PTEN, RAD50, RAD51C, RAD51D, SDHA, SDHB, SDHC, SDHD, SMAD4, SMARCA4, STK11, TP53, TSC1, TSC2, and VHL.  RNA data is routinely analyzed for use in variant interpretation  for all genes.  Based on [name] personal history of cancer, he meets medical criteria for genetic testing. Despite that he meets criteria, he may still have an out of pocket cost. We discussed that if his out of pocket cost for testing is over $100, the laboratory will call and confirm whether he wants to proceed with testing.  If the out of pocket cost of testing is less than $100 he will be billed by the genetic testing laboratory.   PLAN: After considering the risks, benefits, and limitations, [name] provided informed consent to pursue genetic testing and the blood sample was sent to [facility] for analysis of the CustomNext Panel (47 genes)+RNA. Results should be available within approximately 2-3 weeks' time, at which point they will be disclosed by telephone to [name], as will any additional recommendations warranted by these results. [name] will receive a summary of his genetic counseling visit and a copy of his results once available. This information will also be available in Epic.   [name] questions were answered to his satisfaction today. Our contact information was provided should additional questions or concerns arise.  Thank you for the referral and allowing [place] to share in the care of your patient.   Lucille Passy, MS, [facility] Genetic Counselor [email or web address] (P) [phone]  The patient was seen for a total of 20 minutes in face-to-face genetic counseling.  The patient brought his father. Dr. Alen Richardson was available to discuss this case as needed.   _______________________________________________________________________ For Office Staff:  Number of people involved in session: 2 Was an Intern/ student involved with case: no

## 2021-05-25 NOTE — Progress Notes (Signed)
                               Care Plan Summary  Name: [name] DOB: [birth year]   Your Medical Team:   Urologist -  Dr. Raynelle Bring, [facility]  Radiation Oncologist - Dr. Tyler Pita, [facility]   Medical Oncologist - Dr. Zola Button, [place]  Recommendations: 1) ADT (Androgen Deprivation Therapy) 2) Radiation    * These recommendations are based on information available as of today's consult.      Recommendations may change depending on the results of further tests or exams.    Next Steps: 1) [facility] will contact you to set up ADT  2) Dr. Johny Shears office will contact you to start radiation.   When appointments need to be scheduled, you will be contacted by [facility] and/or [facility].  Questions?  Please do not hesitate to call Katheren Puller, BSN, RN at [phone] any questions or concerns.  Kathlee Nations is your Oncology Nurse Navigator and is available to assist you while you're receiving your medical care at [facility].

## 2021-05-25 NOTE — Progress Notes (Signed)
Radiation Oncology         [phone] ________________________________  Multidisciplinary Prostate Cancer Clinic  Radiation Oncology Re-Consultation  Name: [name] MRN: [id number]  Date: 05/25/2021  DOB: Sep 26, 1965  CC:Unk Pinto, MD  Raynelle Bring, MD   REFERRING PHYSICIAN: Raynelle Bring, MD  DIAGNOSIS: 55 y.o. gentleman with adverse pathology and detectable postoperative PSA of 0.56 s/p RALP on 01/29/2021 for stage pT3a, pN0 Gleason 4+5 prostate cancer.    ICD-10-CM   1. Prostate cancer ([place])  C61      HISTORY OF PRESENT ILLNESS::[name] is a 55 y.o. gentleman. He has previously been followed by Dr. Jeffie Pollock since at least 2011 for an elevated PSA and had undergone prostate biopsies in 01/2010 and 10/2012, both of which were benign.  A surveillance MRI in 10/2015 was also negative. PSA in 01/2016 was 4.86. He was started on finasteride later that year for hair loss. Unfortunately, after a drop in his PSA to 2.56, his PSA rose again to 4.17 (8.34 adjusted for finasteride) in 08/2020. He underwent prostate MRI on 10/05/20 showing a 1.6 cm lesion of anterior fibromuscular stroma bilaterally and of the right anterior transition zone at the apex (PI-RADS 3).  The prostate volume was estimated to be 23.71 cc. He discontinued the finasteride in 10/2020.  And proceeded to MRI fusion biopsy of the prostate on 11/22/20.  The prostate volume measured 27.83 cc by ultrasound.  Out of 15 core biopsies, 2 were positive.  The maximum Gleason score was 4+3, and this was seen in one ROI MRI lesion sample. Additionally, Gleason 3+4 was seen in a second ROI MRI lesion sample. All of the standard 12 cores were benign.    We initially met him in the multidisciplinary prostate clinic on 12/22/20.  After a thorough discussion regarding all of his treatment options, he ultimately elected to proceed with prostatectomy.  A robotic assisted laparoscopic prostatectomy was performed on 01/29/21, under the  care and direction of Dr. Alinda Money.  Final surgical pathology showed pT3a,N0, Gleason 4+5 prostatic adenocarcinoma, with extraprostatic extension present at the left anterior from apex to mid and a positive margin at the left apex. Seminal vesicles, other margins, and all 8 lymph nodes were negative.   A postoperative PSA on 04/25/21 was detectable at 0.56. He underwent PSMA scan on 05/10/21 showing no visible evidence of residual carcinoma in the prostatectomy bed, adenopathy, or visceral or skeletal metastasis.  The patient reviewed the pathology, lab and imaging results with his urologist and he has kindly been referred today to the multidisciplinary prostate cancer clinic for presentation of pathology and radiology studies in our conference for discussion of potential radiation treatment options and clinical evaluation.  PREVIOUS RADIATION THERAPY: No  PAST MEDICAL HISTORY:  has a past medical history of Allergy, Anemia, Anxiety, DDD (degenerative disc disease), GERD (gastroesophageal reflux disease), Gilbert's syndrome, Gout, Hyperlipidemia, Hypertension, Low back pain, Prostate cancer ([place]), and Tubular adenoma of colon (06/2015).    PAST SURGICAL HISTORY: Past Surgical History:  Procedure Laterality Date   CHOLECYSTECTOMY     CHOLESTEATOMA EXCISION     LYMPHADENECTOMY Bilateral 01/29/2021   Procedure: LYMPHADENECTOMY, PELVIC;  Surgeon: Raynelle Bring, MD;  Location: [facility];  Service: Urology;  Laterality: Bilateral;   PROSTATE BIOPSY     prostrate  01/2021   ROBOT ASSISTED LAPAROSCOPIC RADICAL PROSTATECTOMY N/A 01/29/2021   Procedure: XI ROBOTIC ASSISTED LAPAROSCOPIC RADICAL PROSTATECTOMY LEVEL 2;  Surgeon: Raynelle Bring, MD;  Location: [facility];  Service: Urology;  Laterality: N/A;  UPPER GASTROINTESTINAL ENDOSCOPY  07/15/2009   VASECTOMY     WISDOM TOOTH EXTRACTION Bilateral     FAMILY HISTORY: family history includes Diabetes in his father; Hypertension in his father; Lung cancer in  his mother.  SOCIAL HISTORY:  reports that he has never smoked. He has never used smokeless tobacco. He reports current alcohol use. He reports that he does not use drugs.  ALLERGIES: Patient has no known allergies.  MEDICATIONS:  Current Outpatient Medications  Medication Sig Dispense Refill   allopurinol (ZYLOPRIM) 300 MG tablet TAKE 1 TABLET BY MOUTH DAILY TO PREVENT GOUT 90 tablet 1   cetirizine (ZYRTEC) 10 MG tablet Take 10 mg by mouth daily.     Minoxidil (MINOXIDIL FOR MEN) 5 % FOAM Apply to area of thinning hair twice daily. Rinse hands and other skin areas after use. 60 g 1   pantoprazole (PROTONIX) 40 MG tablet Take 1 tablet (40 mg total) by mouth daily. 90 tablet 4   No current facility-administered medications for this encounter.    REVIEW OF SYSTEMS:  On review of systems, the patient reports that he is doing well overall.  He reports that he is pretty much regained full daytime continence at this point and only has rare nocturnal leakage.  He denies any chest pain, shortness of breath, cough, fevers, chills, night sweats, unintended weight changes. He denies any bowel disturbances, and denies abdominal pain, nausea or vomiting. He denies any new musculoskeletal or joint aches or pains. A complete review of systems is obtained and is otherwise negative.   PHYSICAL EXAM:  Wt Readings from Last 3 Encounters:  05/25/21 277 lb 6.4 oz (125.8 kg)  05/17/21 278 lb 3.2 oz (126.2 kg)  03/28/21 270 lb (122.5 kg)   Temp Readings from Last 3 Encounters:  05/25/21 97.6 F (36.4 C)  05/17/21 97.7 F (36.5 C)  02/01/21 98.7 F (37.1 C) (Oral)   BP Readings from Last 3 Encounters:  05/25/21 123/72  05/17/21 112/70  03/28/21 128/76   Pulse Readings from Last 3 Encounters:  05/25/21 69  05/17/21 70  03/28/21 74    /10  In general this is a well appearing Caucasian male in no acute distress. He's alert and oriented x4 and appropriate throughout the examination. Cardiopulmonary  assessment is negative for acute distress and he exhibits normal effort.    KPS = 100  100 - Normal; no complaints; no evidence of disease. 90   - Able to carry on normal activity; minor signs or symptoms of disease. 80   - Normal activity with effort; some signs or symptoms of disease. 77   - Cares for self; unable to carry on normal activity or to do active work. 60   - Requires occasional assistance, but is able to care for most of his personal needs. 50   - Requires considerable assistance and frequent medical care. 108   - Disabled; requires special care and assistance. 10   - Severely disabled; hospital admission is indicated although death not imminent. 41   - Very sick; hospital admission necessary; active supportive treatment necessary. 10   - Moribund; fatal processes progressing rapidly. 0     - Dead  Karnofsky DA, Abelmann WH, Craver LS and Burchenal [facility] [phone]) The use of the nitrogen mustards in the palliative treatment of carcinoma: with particular reference to bronchogenic carcinoma Cancer 1 634-56   LABORATORY DATA:  Lab Results  Component Value Date   WBC 7.6 05/17/2021   HGB 13.9  05/17/2021   HCT 42.3 05/17/2021   MCV 85.6 05/17/2021   PLT 250 05/17/2021   Lab Results  Component Value Date   NA 141 05/17/2021   K 4.7 05/17/2021   CL 105 05/17/2021   CO2 30 05/17/2021   Lab Results  Component Value Date   ALT 15 05/17/2021   AST 16 05/17/2021   ALKPHOS 69 03/06/2016   BILITOT 0.3 05/17/2021     RADIOGRAPHY: NM PET (PSMA) SKULL TO MID THIGH  Result Date: 05/13/2021 CLINICAL DATA:  Prostatectomy 01/2021.  PSA equal 0.56 EXAM: NUCLEAR MEDICINE PET SKULL BASE TO THIGH TECHNIQUE: 9.7 mCi F18 Piflufolastat (Pylarify) was injected intravenously. Full-ring PET imaging was performed from the skull base to thigh after the radiotracer. CT data was obtained and used for attenuation correction and anatomic localization. COMPARISON:  None. FINDINGS: NECK No radiotracer  activity in neck lymph nodes. Incidental CT finding: None CHEST No radiotracer accumulation within mediastinal or hilar lymph nodes. No suspicious pulmonary nodules on the CT scan. Incidental CT finding: None ABDOMEN/PELVIS Prostate: No focal activity in the prostate bed. Lymph nodes: No abnormal radiotracer accumulation within pelvic or abdominal nodes. Liver: No evidence of liver metastasis Incidental CT finding: None SKELETON No focal  activity to suggest skeletal metastasis. IMPRESSION: 1. No evidence of residual carcinoma the prostatectomy bed. 2. No metastatic pelvic adenopathy or retroperitoneal periaortic adenopathy by PSMA PET imaging. 3. No visceral metastasis or skeletal metastasis. Electronically Signed   By: Suzy Bouchard M.D.   On: 05/13/2021 13:20       IMPRESSION/PLAN: 55 y.o. gentleman with postoperative PSA of 0.56 and high risk features s/p RALP 01/2021 for stage pT3a, pN0 Gleason 4+5 prostate cancer. Today we reviewed the findings and workup thus far.  We discussed the natural history of prostate cancer.  We reviewed the implications of positive margins, extracapsular extension, and detectable postoperative PSA on the risk of prostate cancer recurrence. We reviewed some of the evidence suggesting an advantage for patients with adverse surgical pathology and detectable postoperative PSA, who undergo early salvage therapy can lead to excellent results in terms of disease control and survival. We discussed radiation treatment directed to the prostatic fossa with regard to the logistics and delivery of a 7.5 week course of daily external beam radiotherapy as well as anticipated acute and long-term side effects associated with this treatment.  We also reviewed the role of ST-ADT in this setting as well as expected associated side effects.  He was encouraged to ask questions that were answered to his stated satisfaction.  At the conclusion of our conversation, the patient is interested in  moving forward with the recommended 7.5 weeks of salvage external beam therapy concurrent with ST-ADT.  We will share our discussion with Dr. Alinda Money and make arrangements for a follow-up visit, first available, to start ADT prior to CT simulation/treatment planning. The patient appears to have a good understanding of his disease and our treatment recommendations which are of curative intent and is in agreement with the stated plan. Therefore, we will move forward with treatment planning accordingly, in anticipation of beginning the daily salvage prostate fossa radiotherapy in the near future. We enjoyed meeting with him again today and look forward to continuing to participate in his care.  We personally spent 60 minutes in this encounter including chart review, reviewing radiological studies, meeting face-to-face with the patient, entering orders and completing documentation.    Nicholos Johns, PA-C    Tyler Pita, MD  [facility]  Radiation Oncology Direct Dial: [phone]  Fax: [phone] [email or web address]  Skype  [email or web address]   This document serves as a record of services personally performed by Tyler Pita, MD and Freeman Caldron, PA-C. It was created on their behalf by Wilburn Mylar, a trained medical scribe. The creation of this record is based on the scribe's personal observations and the provider's statements to them. This document has been checked and approved by the attending provider.

## 2021-05-25 NOTE — Progress Notes (Signed)
Hematology and Oncology Follow Up Visit  [name] [id number] 1965/07/19 55 y.o. 05/25/2021 9:59 AM Unk Pinto, MDMcKeown, Gwyndolyn Saxon, MD   Principle Diagnosis: 55 year old man diagnosed with prostate cancer in May 2022.  He was found to have a Gleason score 4+3 equal 7 and a PSA of 4.17.   Prior Therapy:  He is status post robotic assisted laparoscopic radical prostatectomy and bilateral lymphadenectomy completed on January 29, 2021.  The final pathology showed Gleason score 4+ 5 = 9 tumor with extra extension in the left anterior apex and T3a disease.  0 out of 8 lymph nodes were involved.  Current therapy: Under consideration for additional therapy.  Interim History: [name] returns today for a follow-up visit for the prostate cancer multidisciplinary clinic.  He has recovered reasonably well from surgery and has resumed continence for the most part.  He denies any abdominal pain or discomfort.  He denies any flank pain.  His performance status activity level remained excellent and resumed work-related duties.     Medications: I have reviewed the patient's current medications.  Current Outpatient Medications  Medication Sig Dispense Refill   allopurinol (ZYLOPRIM) 300 MG tablet TAKE 1 TABLET BY MOUTH DAILY TO PREVENT GOUT 90 tablet 1   cetirizine (ZYRTEC) 10 MG tablet Take 10 mg by mouth daily.     Minoxidil (MINOXIDIL FOR MEN) 5 % FOAM Apply to area of thinning hair twice daily. Rinse hands and other skin areas after use. 60 g 1   pantoprazole (PROTONIX) 40 MG tablet Take 1 tablet (40 mg total) by mouth daily. 90 tablet 4   No current facility-administered medications for this visit.     Allergies: No Known Allergies       Lab Results: Lab Results  Component Value Date   WBC 7.6 05/17/2021   HGB 13.9 05/17/2021   HCT 42.3 05/17/2021   MCV 85.6 05/17/2021   PLT 250 05/17/2021     Chemistry      Component Value Date/Time   NA 141 05/17/2021 1057   K  4.7 05/17/2021 1057   CL 105 05/17/2021 1057   CO2 30 05/17/2021 1057   BUN 11 05/17/2021 1057   CREATININE 1.06 05/17/2021 1057      Component Value Date/Time   CALCIUM 9.4 05/17/2021 1057   ALKPHOS 69 03/06/2016 1607   AST 16 05/17/2021 1057   ALT 15 05/17/2021 1057   BILITOT 0.3 05/17/2021 1057          Impression and Plan:   55 year old with:  1.  Prostate cancer diagnosed in May 2022.  He was initially found to have Gleason score 4+3 = 7 on initial biopsy and subsequently found to have a Gleason score 4+ 5 = 9 radical prostatectomy and a PSA of 0.564 months after his surgery.  His case was discussed today in the prostate cancer multidisciplinary clinic and treatment choices were reviewed.  Given his persistent elevation in PSA and high risk disease additional therapy is warranted.  I will be in the form of additional radiation and androgen deprivation therapy for at least 6 months.    Complication associated with this treatment at the role for androgen receptor pathway inhibitors were discussed.  At this time I see no additional need for therapy escalation but this could be used in the future if he develops relapsed disease.  2.  Follow-up: He will be evaluated back in the future if he develops worsening disease or persistent elevation of PSA for additional treatment.  20  minutes were dedicated to this visit.  The time was dedicated to reviewing pathology results, reviewing imaging studies and outlining treatment plan of care discussion.    Zola Button, MD 11/11/20229:59 AM

## 2021-05-25 NOTE — Consult Note (Signed)
Multi-Disciplinary Clinic     05/25/2021   --------------------------------------------------------------------------------   [name]  MRN: [id number]  DOB: 1966-03-19, 55 year old Male  SSN: [id number]   PRIMARY CARE:  Unk Pinto, MD  REFERRING:    PROVIDER:  [name], M.D.  TREATING:  Raynelle Bring, M.D.  LOCATION:  [facility] [phone]     --------------------------------------------------------------------------------   CC/HPI: CC: Prostate Cancer   PCP: Dr. Unk Pinto  Location of consult: [facility] - Prostate Cancer Multidisciplinary Clinic   [name] is a 55 year old healthy gentleman previously seen in the prostate [place] in July 2022. He had a long standing history of an elevated PSA s/p two prior negative prostate biopsies in the past. He had been on 2.5 mg of finasteride for hair loss and was noted to have an increase in his PSA from 2.84 in 2021 up to 4.17 in February 2022 prompting further evaluation with an MRI of the prostate on 10/05/20. This demonstrated a 1.6 cm PIRADS 3 lesion at the right anterior apex. An MR/US fusion biopsy was then performed on 11/22/20 with all 12 systematic cores negative for malignancy but 2 out of 3 targeted biopsies positive for malignancy. He was found to have Gleason 4+3=7 (GG 3) disease. Following discussion in the [facility], he elected primary surgical therapy.   He underwent a BNS RAL radical prostatectomy and BPLND on 01/29/21 and his pathology demonstrated pT3a N0 Mx, Gleason 4+5=9 adenocarcinoma with a positive surgical margin at the left apex. His initial postoperative PSA on 1012/22 was 0.56. He proceeded with a PSMA PET scan on 05/10/21 which showed no uptake.    Baseline urinary function: IPSS is 11. His symptoms are mostly urinary urgency and frequency. He gets up twice per night.  Baseline erectile function: SHIM score is 9. This is mostly due to sexual inactivity recently. He is  divorced and has not been sexually active is currently not in a relationship. Overall, he feels that he did have preserved erectile function.     ALLERGIES: No Allergies    MEDICATIONS: Allopurinol  Finasteride  Sildenafil Citrate 100 mg tablet 1 tablet PO as directed PRN  Advil TABS Oral PRN  Diazepam 10 mg tablet 1-2 tablet PO Daily Take one hour prior to procedure.  Heartburn Aid  Levofloxacin 750 mg tablet 1 po 1 hour prior to the procedure  Vitamin B12  Vitamin D3     GU PSH: Prostate Needle Biopsy - 11/22/2020, 2014 Robotic Radical Prostatectomy - 01/29/2021 Vasectomy - 2011       [place] Notes: Colonoscopy (Fiberoptic), Biopsy Of The Prostate Needle, Surgery Of Male Genitalia Vasectomy, Cholecystectomy   NON-GU PSH: Cholecystectomy (open) - 2011 Diagnostic Colonoscopy - 2017 Surgical Pathology, Gross And Microscopic Examination For Prostate Needle - 11/22/2020     GU PMH: Stress Incontinence - 05/24/2021, - 05/02/2021, - 04/25/2021, - 04/02/2021, - 03/14/2021, - 02/28/2021, - 02/07/2021, - 01/23/2021 ED due to arterial insufficiency - 05/02/2021, - 02/07/2021 Prostate Cancer - 05/02/2021, - 02/07/2021, - 01/19/2021, - 12/22/2020      PMH Notes:   1) Prostate cancer: He is s/p a BNS RAL radical prostatectomy and BPLND on 01/29/21.   Diagnosis: pT3a N0 Mx, Gleason 4+5=9 adenocarcinoma with a positive surgical margin at the left apex  Pretreatment PSA: 4.17 (on 5 ARI)  Pretreatment SHIM score: 9   NON-GU PMH: Muscle weakness (generalized) - 05/24/2021, - 04/25/2021 Other muscle spasm - 05/24/2021, - 04/25/2021 Insomnia,  unspecified, Insomnia - 2014 GERD Gout    FAMILY HISTORY: Death In The Family Mother - Runs In Family Diabetes - Father Family Health Status - Father alive at age 43 - Mother Lung Cancer - Mother   SOCIAL HISTORY: Marital Status: Married Preferred Language: English; Ethnicity: Not Hispanic Or Latino; Race: White Current Smoking Status: Patient has never  smoked.  Social Drinker.  Drinks 2 caffeinated drinks per day. Patient's occupation Company secretary.     Notes: Never A Smoker, Marital History - Currently Married, Alcohol Use, Caffeine Use, Occupation:   REVIEW OF SYSTEMS:    GU Review Male:   Patient denies frequent urination, hard to postpone urination, burning/ pain with urination, get up at night to urinate, leakage of urine, stream starts and stops, trouble starting your streams, and have to strain to urinate .  Gastrointestinal (Lower):   Patient denies diarrhea and constipation.  Gastrointestinal (Upper):   Patient denies nausea and vomiting.  Constitutional:   Patient denies fever, night sweats, weight loss, and fatigue.  Skin:   Patient denies skin rash/ lesion and itching.  Eyes:   Patient denies blurred vision and double vision.  Ears/ Nose/ Throat:   Patient denies sore throat and sinus problems.  Hematologic/Lymphatic:   Patient denies swollen glands and easy bruising.  Cardiovascular:   Patient denies leg swelling and chest pains.  Respiratory:   Patient denies cough and shortness of breath.  Endocrine:   Patient denies excessive thirst.  Musculoskeletal:   Patient denies back pain and joint pain.  Neurological:   Patient denies headaches and dizziness.  Psychologic:   Patient denies depression and anxiety.   VITAL SIGNS: None   MULTI-SYSTEM PHYSICAL EXAMINATION:    Constitutional: Well-nourished. No physical deformities. Normally developed. Good grooming.     Complexity of Data:  X-Ray Review: PET Scan: Reviewed Films.     04/25/21 09/06/20 06/05/20 05/17/20 09/13/19 06/08/19 05/26/18 05/19/17  PSA  Total PSA 0.56 ng/mL 4.17 ng/mL 4.11 ng/mL 3.97 ng/ml 2.84 ng/mL 3.48 ng/mL 2.70 ng/mL 2.56 ng/mL  Free PSA  0.45 ng/mL 0.39 ng/mL       % Free PSA  11 % PSA 9 % PSA        Notes:                     CLINICAL DATA: Prostatectomy 01/2021. PSA equal 0.56   EXAM:  NUCLEAR MEDICINE PET SKULL BASE TO  THIGH   TECHNIQUE:  9.7 mCi F18 Piflufolastat (Pylarify) was injected intravenously.  Full-ring PET imaging was performed from the skull base to thigh  after the radiotracer. CT data was obtained and used for attenuation  correction and anatomic localization.   COMPARISON: None.   FINDINGS:  NECK   No radiotracer activity in neck lymph nodes.   Incidental CT finding: None   CHEST   No radiotracer accumulation within mediastinal or hilar lymph nodes.  No suspicious pulmonary nodules on the CT scan.   Incidental CT finding: None   ABDOMEN/PELVIS   Prostate: No focal activity in the prostate bed.   Lymph nodes: No abnormal radiotracer accumulation within pelvic or  abdominal nodes.   Liver: No evidence of liver metastasis   Incidental CT finding: None   SKELETON   No focal activity to suggest skeletal metastasis.   IMPRESSION:  1. No evidence of residual carcinoma the prostatectomy bed.  2. No metastatic pelvic adenopathy or retroperitoneal periaortic  adenopathy by PSMA PET imaging.  3. No visceral metastasis or skeletal metastasis.    Electronically Signed  By: Suzy Bouchard M.D.  On: 05/13/2021 13:20   PROCEDURES: None   ASSESSMENT:      ICD-10 Details  1 GU:   Prostate Cancer - C61    PLAN:          Notes:   1. Biochemically recurrent prostate cancer: I had a detailed discussion with Mr. Mages and his father today considering his biochemically recurrent prostate cancer. We reviewed his PSMA PET scan which fortunately did not demonstrate any evidence of metastatic disease. However, did not demonstrate any uptake in the pelvis either. Considering his locally advanced, high-grade disease, and positive surgical margin, it was recommended that he proceed with salvage curative treatment with short-term androgen deprivation therapy and salvage radiation therapy. We have reviewed these treatment options in detail as including the potential risks/side effects of  each of these therapies. He has just about regain continence and is continent during the daytime with rarer nocturnal leakage episodes. Ultimately, he consents to proceed in this fashion. He will be scheduled to be treated with Eligard 45 mg in the near future. He will then proceed with salvage radiation therapy and I will schedule him for follow-up to begin PSA monitoring in approximately 6 months.   CC: Dr. Unk Pinto  Dr. Tyler Pita  Dr. Zola Button        Next Appointment:      Next Appointment: 07/26/2021 10:00 AM    Appointment Type: 60 Physical Therapy    Location: [facility] [phone]    Provider: Lovenia Kim    Reason for Visit: PT      E & M CODES: We spent 33 minutes dedicated to evaluation and management time, including face to face interaction, discussions on coordination of care, documentation, result review, and discussion with others as applicable.

## 2021-05-30 LAB — COLOGUARD: COLOGUARD: NEGATIVE

## 2021-05-31 ENCOUNTER — Telehealth: Payer: Self-pay | Admitting: *Deleted

## 2021-05-31 NOTE — Telephone Encounter (Signed)
CALLED PATIENT TO INFORM OF SIM APPT. FOR 06-06-21- ARRIVAL TIME- 8:45 AM @ [facility], SPOKE WITH PATIENT AND HE IS AWARE OF THIS APPT.

## 2021-06-05 ENCOUNTER — Telehealth: Payer: Self-pay | Admitting: *Deleted

## 2021-06-05 NOTE — Telephone Encounter (Signed)
CALLED PATIENT TO REMIND OF SIM APPT. FOR 06-06-21- ARRIVAL TIME- 8:45 AM @ [facility], SPOKE WITH PATIENT AND HE IS AWARE OF THIS APPT.

## 2021-06-06 ENCOUNTER — Ambulatory Visit
Admission: RE | Admit: 2021-06-06 | Discharge: 2021-06-06 | Disposition: A | Discharge status: Home / Self Care | Payer: 59 | Source: Ambulatory Visit | Attending: Radiation Oncology | Admitting: Radiation Oncology

## 2021-06-06 ENCOUNTER — Other Ambulatory Visit: Payer: Self-pay

## 2021-06-06 DIAGNOSIS — C61 Malignant neoplasm of prostate: Secondary | ICD-10-CM | POA: Insufficient documentation

## 2021-06-09 NOTE — Progress Notes (Signed)
  Radiation Oncology         (336) [phone] ________________________________  Name: [name] MRN: [id number]  Date: 06/06/2021  DOB: 1965/10/09  SIMULATION AND TREATMENT PLANNING NOTE    ICD-10-CM   1. Prostate cancer [facility])  C61       DIAGNOSIS:  55 y.o. gentleman with adverse pathology and detectable postoperative PSA of 0.56 s/p RALP on 01/29/2021 for stage pT3a, pN0 Gleason 4+5 prostate cancer.  NARRATIVE:  The patient was brought to the [place].  Identity was confirmed.  All relevant records and images related to the planned course of therapy were reviewed.  The patient freely provided informed written consent to proceed with treatment after reviewing the details related to the planned course of therapy. The consent form was witnessed and verified by the simulation staff.  Then, the patient was set-up in a stable reproducible supine position for radiation therapy.  A vacuum lock pillow device was custom fabricated to position his legs in a reproducible immobilized position.  Then, I performed a urethrogram under sterile conditions to identify the prostatic bed.  CT images were obtained.  Surface markings were placed.  The CT images were loaded into the planning software.  Then the prostate bed target, pelvic lymph node target and avoidance structures including the rectum, bladder, bowel and hips were contoured.  Treatment planning then occurred.  The radiation prescription was entered and confirmed.  A total of one complex treatment devices were fabricated. I have requested : Intensity Modulated Radiotherapy (IMRT) is medically necessary for this case for the following reason:  Rectal sparing.Marland Kitchen  PLAN:  The patient will receive 45 Gy in 25 fractions of 1.8 Gy, followed by a boost to the prostate bed to a total dose of 68.4 Gy with 13 additional fractions of 1.8 Gy.   ________________________________  Sheral Apley Tammi Klippel, M.D.

## 2021-06-11 ENCOUNTER — Encounter: Payer: Self-pay | Admitting: Genetic Counselor

## 2021-06-11 ENCOUNTER — Telehealth: Payer: Self-pay | Admitting: Genetic Counselor

## 2021-06-11 DIAGNOSIS — Z1379 Encounter for other screening for genetic and chromosomal anomalies: Secondary | ICD-10-CM | POA: Insufficient documentation

## 2021-06-11 NOTE — Telephone Encounter (Signed)
I contacted Mr. Grandpre to discuss his genetic testing results. No pathogenic variants were identified in the 47 genes analyzed.   The test report has been scanned into EPIC and is located under the Molecular Pathology section of the Results Review tab.  A portion of the result report is included below for reference. Detailed clinic note to follow.  Lucille Passy, MS, [facility] Genetic Counselor [email or web address] (P) [phone]

## 2021-06-14 DIAGNOSIS — C61 Malignant neoplasm of prostate: Secondary | ICD-10-CM | POA: Diagnosis not present

## 2021-06-19 ENCOUNTER — Ambulatory Visit
Admission: RE | Admit: 2021-06-19 | Discharge: 2021-06-19 | Disposition: A | Discharge status: Home / Self Care | Payer: 59 | Source: Ambulatory Visit | Attending: Radiation Oncology | Admitting: Radiation Oncology

## 2021-06-19 DIAGNOSIS — C61 Malignant neoplasm of prostate: Secondary | ICD-10-CM | POA: Diagnosis not present

## 2021-06-20 ENCOUNTER — Other Ambulatory Visit: Payer: Self-pay

## 2021-06-20 ENCOUNTER — Ambulatory Visit
Admission: RE | Admit: 2021-06-20 | Discharge: 2021-06-20 | Disposition: A | Discharge status: Home / Self Care | Payer: 59 | Source: Ambulatory Visit | Attending: Radiation Oncology | Admitting: Radiation Oncology

## 2021-06-20 DIAGNOSIS — C61 Malignant neoplasm of prostate: Secondary | ICD-10-CM | POA: Diagnosis not present

## 2021-06-21 ENCOUNTER — Ambulatory Visit
Admission: RE | Admit: 2021-06-21 | Discharge: 2021-06-21 | Disposition: A | Discharge status: Home / Self Care | Payer: 59 | Source: Ambulatory Visit | Attending: Radiation Oncology | Admitting: Radiation Oncology

## 2021-06-21 DIAGNOSIS — C61 Malignant neoplasm of prostate: Secondary | ICD-10-CM | POA: Diagnosis not present

## 2021-06-22 ENCOUNTER — Ambulatory Visit
Admission: RE | Admit: 2021-06-22 | Discharge: 2021-06-22 | Disposition: A | Discharge status: Home / Self Care | Payer: 59 | Source: Ambulatory Visit | Attending: Radiation Oncology | Admitting: Radiation Oncology

## 2021-06-22 ENCOUNTER — Other Ambulatory Visit: Payer: Self-pay

## 2021-06-22 DIAGNOSIS — C61 Malignant neoplasm of prostate: Secondary | ICD-10-CM | POA: Diagnosis not present

## 2021-06-25 ENCOUNTER — Ambulatory Visit
Admission: RE | Admit: 2021-06-25 | Discharge: 2021-06-25 | Disposition: A | Discharge status: Home / Self Care | Payer: 59 | Source: Ambulatory Visit | Attending: Radiation Oncology | Admitting: Radiation Oncology

## 2021-06-25 ENCOUNTER — Ambulatory Visit: Payer: Self-pay | Admitting: Genetic Counselor

## 2021-06-25 ENCOUNTER — Other Ambulatory Visit: Payer: Self-pay

## 2021-06-25 DIAGNOSIS — Z1379 Encounter for other screening for genetic and chromosomal anomalies: Secondary | ICD-10-CM

## 2021-06-25 DIAGNOSIS — C61 Malignant neoplasm of prostate: Secondary | ICD-10-CM | POA: Diagnosis not present

## 2021-06-25 NOTE — Progress Notes (Signed)
HPI:   Mr. Boettner was previously seen in the [place] clinic due to a personal history of cancer and concerns regarding a hereditary predisposition to cancer. Please refer to our prior cancer genetics clinic note for more information regarding our discussion, assessment and recommendations, at the time. Mr. Zeiner recent genetic test results were disclosed to him, as were recommendations warranted by these results. These results and recommendations are discussed in more detail below.  CANCER HISTORY:  Oncology History  Prostate cancer ([place])  12/01/2020 Initial Diagnosis   Prostate cancer ([place])   12/22/2020 Cancer Staging   Staging form: Prostate, AJCC 8th Edition - Clinical: Stage IIC (cT1c, cN0, cM0, PSA: 4.2, Grade Group: 3) - Signed by Wyatt Portela, MD on 12/22/2020 Prostate specific antigen (PSA) range: Less than 10 Gleason score: 7 Histologic grading system: 5 grade system    01/29/2021 Cancer Staging   Staging form: Prostate, AJCC 8th Edition - Pathologic stage from 01/29/2021: Stage IIIC (pT3a, pN0, cM0, PSA: 4.2, Grade Group: 5) - Signed by Freeman Caldron, PA-C on 05/25/2021 Histopathologic type: Adenocarcinoma, NOS Stage prefix: Initial diagnosis Prostate specific antigen (PSA) range: Less than 10 Gleason primary pattern: 4 Gleason secondary pattern: 5 Gleason score: 9 Histologic grading system: 5 grade system Residual tumor (R): R1 - Microscopic     Genetic Testing   [facility] CustomNext+RNA was Negative. Report date is 06/06/2021.  The CustomNext-Cancer+RNAinsight panel offered by [employer] includes sequencing and rearrangement analysis for the following 47 genes:  APC, ATM, AXIN2, BARD1, BMPR1A, BRCA1, BRCA2, BRIP1, CDH1, CDK4, CDKN2A, CHEK2, CTNNA1, DICER1, EPCAM, GREM1, HOXB13, KIT, MEN1, MLH1, MSH2, MSH3, MSH6, MUTYH, NBN, NF1, NTHL1, PALB2, PDGFRA, PMS2, POLD1, POLE, PTEN, RAD50, RAD51C, RAD51D, SDHA, SDHB, SDHC, SDHD, SMAD4, SMARCA4, STK11,  TP53, TSC1, TSC2, and VHL.  RNA data is routinely analyzed for use in variant interpretation for all genes.     FAMILY HISTORY:  We obtained a detailed, 4-generation family history.  Significant diagnoses are listed below:      Mr. Denardo is unaware of previous family history of genetic testing for hereditary cancer risks. His mother was diagnosed with lung cancer in her 11s, she smoked and is deceased.    GENETIC TEST RESULTS:  The [facility] CustomNext Panel found no pathogenic mutations.   The CustomNext-Cancer+RNAinsight panel offered by [employer] includes sequencing and rearrangement analysis for the following 47 genes:  APC, ATM, AXIN2, BARD1, BMPR1A, BRCA1, BRCA2, BRIP1, CDH1, CDK4, CDKN2A, CHEK2, CTNNA1, DICER1, EPCAM, GREM1, HOXB13, KIT, MEN1, MLH1, MSH2, MSH3, MSH6, MUTYH, NBN, NF1, NTHL1, PALB2, PDGFRA, PMS2, POLD1, POLE, PTEN, RAD50, RAD51C, RAD51D, SDHA, SDHB, SDHC, SDHD, SMAD4, SMARCA4, STK11, TP53, TSC1, TSC2, and VHL.  RNA data is routinely analyzed for use in variant interpretation for all genes.  The test report has been scanned into EPIC and is located under the Molecular Pathology section of the Results Review tab.  A portion of the result report is included below for reference. Genetic testing reported out on 06/06/2021.        Even though a pathogenic variant was not identified, possible explanations for his personal history of cancer may include: There may be no hereditary risk for cancer in the family. Mr. Vetrano prostate cancer may be due to other genetic or environmental factors. There may be a gene mutation in one of these genes that current testing methods cannot detect, but that chance is small. There could be another gene that has not yet been discovered, or that we have  not yet tested, that is responsible for the cancer diagnoses in the family.   Therefore, it is important to remain in touch with cancer genetics in the future so that we can continue to  offer Mr. Cassetta the most up to date genetic testing.   ADDITIONAL GENETIC TESTING:  We discussed with Mr. Marcell that his genetic testing was fairly extensive.  If there are genes identified to increase cancer risk that can be analyzed in the future, we would be happy to discuss and coordinate this testing at that time.    CANCER SCREENING RECOMMENDATIONS:  Mr. Asbridge test result is considered negative (normal).  This means that we have not identified a hereditary cause for his personal history of cancer at this time. Most cancers happen by chance and this negative test suggests that his cancer may fall into this category.    An individual's cancer risk and medical management are not determined by genetic test results alone. Overall cancer risk assessment incorporates additional factors, including personal medical history, family history, and any available genetic information that may result in a personalized plan for cancer prevention and surveillance. Therefore, it is recommended he continue to follow the cancer management and screening guidelines provided by his oncology and primary healthcare provider.  RECOMMENDATIONS FOR FAMILY MEMBERS:   Since he did not inherit a mutation in a cancer predisposition gene included on this panel, his children could not have inherited a mutation from him in one of these genes.  FOLLOW-UP:  Cancer genetics is a rapidly advancing field and it is possible that new genetic tests will be appropriate for him and/or his family members in the future. We encouraged him to remain in contact with cancer genetics on an annual basis so we can update his personal and family histories and let him know of advances in cancer genetics that may benefit this family.   Our contact number was provided. Mr. Maino questions were answered to his satisfaction, and he knows he is welcome to call us at anytime with additional questions or concerns.   Lucille Passy, MS,  [facility] Genetic Counselor [email or web address] (P) [phone]

## 2021-06-26 ENCOUNTER — Other Ambulatory Visit: Payer: Self-pay

## 2021-06-26 ENCOUNTER — Ambulatory Visit
Admission: RE | Admit: 2021-06-26 | Discharge: 2021-06-26 | Disposition: A | Discharge status: Home / Self Care | Payer: 59 | Source: Ambulatory Visit | Attending: Radiation Oncology | Admitting: Radiation Oncology

## 2021-06-26 DIAGNOSIS — C61 Malignant neoplasm of prostate: Secondary | ICD-10-CM | POA: Diagnosis not present

## 2021-06-27 ENCOUNTER — Other Ambulatory Visit: Payer: Self-pay

## 2021-06-27 ENCOUNTER — Ambulatory Visit
Admission: RE | Admit: 2021-06-27 | Discharge: 2021-06-27 | Disposition: A | Discharge status: Home / Self Care | Payer: 59 | Source: Ambulatory Visit | Attending: Radiation Oncology | Admitting: Radiation Oncology

## 2021-06-27 DIAGNOSIS — C61 Malignant neoplasm of prostate: Secondary | ICD-10-CM | POA: Diagnosis not present

## 2021-06-28 ENCOUNTER — Ambulatory Visit
Admission: RE | Admit: 2021-06-28 | Discharge: 2021-06-28 | Disposition: A | Discharge status: Home / Self Care | Payer: 59 | Source: Ambulatory Visit | Attending: Radiation Oncology | Admitting: Radiation Oncology

## 2021-06-28 DIAGNOSIS — C61 Malignant neoplasm of prostate: Secondary | ICD-10-CM | POA: Diagnosis not present

## 2021-06-29 ENCOUNTER — Ambulatory Visit
Admission: RE | Admit: 2021-06-29 | Discharge: 2021-06-29 | Disposition: A | Discharge status: Home / Self Care | Payer: 59 | Source: Ambulatory Visit | Attending: Radiation Oncology | Admitting: Radiation Oncology

## 2021-06-29 ENCOUNTER — Other Ambulatory Visit: Payer: Self-pay

## 2021-06-29 DIAGNOSIS — C61 Malignant neoplasm of prostate: Secondary | ICD-10-CM | POA: Diagnosis not present

## 2021-07-02 ENCOUNTER — Other Ambulatory Visit: Payer: Self-pay

## 2021-07-02 ENCOUNTER — Ambulatory Visit
Admission: RE | Admit: 2021-07-02 | Discharge: 2021-07-02 | Disposition: A | Discharge status: Home / Self Care | Payer: 59 | Source: Ambulatory Visit | Attending: Radiation Oncology | Admitting: Radiation Oncology

## 2021-07-02 DIAGNOSIS — C61 Malignant neoplasm of prostate: Secondary | ICD-10-CM | POA: Diagnosis not present

## 2021-07-03 ENCOUNTER — Ambulatory Visit
Admission: RE | Admit: 2021-07-03 | Discharge: 2021-07-03 | Disposition: A | Discharge status: Home / Self Care | Payer: 59 | Source: Ambulatory Visit | Attending: Radiation Oncology | Admitting: Radiation Oncology

## 2021-07-03 DIAGNOSIS — C61 Malignant neoplasm of prostate: Secondary | ICD-10-CM | POA: Diagnosis not present

## 2021-07-04 ENCOUNTER — Ambulatory Visit
Admission: RE | Admit: 2021-07-04 | Discharge: 2021-07-04 | Disposition: A | Discharge status: Home / Self Care | Payer: 59 | Source: Ambulatory Visit | Attending: Radiation Oncology | Admitting: Radiation Oncology

## 2021-07-04 ENCOUNTER — Other Ambulatory Visit: Payer: Self-pay

## 2021-07-04 DIAGNOSIS — C61 Malignant neoplasm of prostate: Secondary | ICD-10-CM | POA: Diagnosis not present

## 2021-07-05 ENCOUNTER — Other Ambulatory Visit: Payer: Self-pay

## 2021-07-05 ENCOUNTER — Ambulatory Visit
Admission: RE | Admit: 2021-07-05 | Discharge: 2021-07-05 | Disposition: A | Discharge status: Home / Self Care | Payer: 59 | Source: Ambulatory Visit | Attending: Radiation Oncology | Admitting: Radiation Oncology

## 2021-07-05 DIAGNOSIS — C61 Malignant neoplasm of prostate: Secondary | ICD-10-CM | POA: Diagnosis not present

## 2021-07-06 ENCOUNTER — Ambulatory Visit
Admission: RE | Admit: 2021-07-06 | Discharge: 2021-07-06 | Disposition: A | Discharge status: Home / Self Care | Payer: 59 | Source: Ambulatory Visit | Attending: Radiation Oncology | Admitting: Radiation Oncology

## 2021-07-06 DIAGNOSIS — C61 Malignant neoplasm of prostate: Secondary | ICD-10-CM | POA: Diagnosis not present

## 2021-07-10 ENCOUNTER — Ambulatory Visit
Admission: RE | Admit: 2021-07-10 | Discharge: 2021-07-10 | Disposition: A | Discharge status: Home / Self Care | Payer: 59 | Source: Ambulatory Visit | Attending: Radiation Oncology | Admitting: Radiation Oncology

## 2021-07-10 ENCOUNTER — Other Ambulatory Visit: Payer: Self-pay

## 2021-07-10 DIAGNOSIS — C61 Malignant neoplasm of prostate: Secondary | ICD-10-CM | POA: Diagnosis not present

## 2021-07-11 ENCOUNTER — Other Ambulatory Visit: Payer: Self-pay

## 2021-07-11 ENCOUNTER — Ambulatory Visit
Admission: RE | Admit: 2021-07-11 | Discharge: 2021-07-11 | Disposition: A | Discharge status: Home / Self Care | Payer: 59 | Source: Ambulatory Visit | Attending: Radiation Oncology | Admitting: Radiation Oncology

## 2021-07-11 DIAGNOSIS — C61 Malignant neoplasm of prostate: Secondary | ICD-10-CM | POA: Diagnosis not present

## 2021-07-12 ENCOUNTER — Ambulatory Visit
Admission: RE | Admit: 2021-07-12 | Discharge: 2021-07-12 | Disposition: A | Discharge status: Home / Self Care | Payer: 59 | Source: Ambulatory Visit | Attending: Radiation Oncology | Admitting: Radiation Oncology

## 2021-07-12 DIAGNOSIS — C61 Malignant neoplasm of prostate: Secondary | ICD-10-CM | POA: Diagnosis not present

## 2021-07-13 ENCOUNTER — Ambulatory Visit
Admission: RE | Admit: 2021-07-13 | Discharge: 2021-07-13 | Disposition: A | Discharge status: Home / Self Care | Payer: 59 | Source: Ambulatory Visit | Attending: Radiation Oncology | Admitting: Radiation Oncology

## 2021-07-13 DIAGNOSIS — C61 Malignant neoplasm of prostate: Secondary | ICD-10-CM | POA: Diagnosis not present

## 2021-07-17 ENCOUNTER — Ambulatory Visit
Admission: RE | Admit: 2021-07-17 | Discharge: 2021-07-17 | Disposition: A | Discharge status: Home / Self Care | Payer: 59 | Source: Ambulatory Visit | Attending: Radiation Oncology | Admitting: Radiation Oncology

## 2021-07-17 DIAGNOSIS — C61 Malignant neoplasm of prostate: Secondary | ICD-10-CM | POA: Insufficient documentation

## 2021-07-18 ENCOUNTER — Ambulatory Visit
Admission: RE | Admit: 2021-07-18 | Discharge: 2021-07-18 | Disposition: A | Discharge status: Home / Self Care | Payer: 59 | Source: Ambulatory Visit | Attending: Radiation Oncology | Admitting: Radiation Oncology

## 2021-07-18 DIAGNOSIS — C61 Malignant neoplasm of prostate: Secondary | ICD-10-CM | POA: Diagnosis not present

## 2021-07-19 ENCOUNTER — Ambulatory Visit
Admission: RE | Admit: 2021-07-19 | Discharge: 2021-07-19 | Disposition: A | Discharge status: Home / Self Care | Payer: 59 | Source: Ambulatory Visit | Attending: Radiation Oncology | Admitting: Radiation Oncology

## 2021-07-19 DIAGNOSIS — C61 Malignant neoplasm of prostate: Secondary | ICD-10-CM | POA: Diagnosis not present

## 2021-07-20 ENCOUNTER — Ambulatory Visit
Admission: RE | Admit: 2021-07-20 | Discharge: 2021-07-20 | Disposition: A | Discharge status: Home / Self Care | Payer: 59 | Source: Ambulatory Visit | Attending: Radiation Oncology | Admitting: Radiation Oncology

## 2021-07-20 DIAGNOSIS — C61 Malignant neoplasm of prostate: Secondary | ICD-10-CM | POA: Diagnosis not present

## 2021-07-23 ENCOUNTER — Other Ambulatory Visit: Payer: Self-pay

## 2021-07-23 ENCOUNTER — Ambulatory Visit
Admission: RE | Admit: 2021-07-23 | Discharge: 2021-07-23 | Disposition: A | Discharge status: Home / Self Care | Payer: 59 | Source: Ambulatory Visit | Attending: Radiation Oncology | Admitting: Radiation Oncology

## 2021-07-23 DIAGNOSIS — C61 Malignant neoplasm of prostate: Secondary | ICD-10-CM | POA: Diagnosis not present

## 2021-07-24 ENCOUNTER — Ambulatory Visit: Payer: 59

## 2021-07-24 ENCOUNTER — Other Ambulatory Visit: Payer: Self-pay

## 2021-07-24 DIAGNOSIS — C61 Malignant neoplasm of prostate: Secondary | ICD-10-CM | POA: Diagnosis not present

## 2021-07-25 ENCOUNTER — Other Ambulatory Visit: Payer: Self-pay

## 2021-07-25 ENCOUNTER — Ambulatory Visit
Admission: RE | Admit: 2021-07-25 | Discharge: 2021-07-25 | Disposition: A | Discharge status: Home / Self Care | Payer: 59 | Source: Ambulatory Visit | Attending: Radiation Oncology | Admitting: Radiation Oncology

## 2021-07-25 DIAGNOSIS — C61 Malignant neoplasm of prostate: Secondary | ICD-10-CM | POA: Diagnosis not present

## 2021-07-26 ENCOUNTER — Ambulatory Visit: Payer: 59

## 2021-07-26 ENCOUNTER — Other Ambulatory Visit: Payer: Self-pay

## 2021-07-26 DIAGNOSIS — C61 Malignant neoplasm of prostate: Secondary | ICD-10-CM | POA: Diagnosis not present

## 2021-07-27 ENCOUNTER — Ambulatory Visit
Admission: RE | Admit: 2021-07-27 | Discharge: 2021-07-27 | Disposition: A | Discharge status: Home / Self Care | Payer: 59 | Source: Ambulatory Visit | Attending: Radiation Oncology | Admitting: Radiation Oncology

## 2021-07-27 DIAGNOSIS — C61 Malignant neoplasm of prostate: Secondary | ICD-10-CM | POA: Diagnosis not present

## 2021-07-30 ENCOUNTER — Other Ambulatory Visit: Payer: Self-pay

## 2021-07-30 ENCOUNTER — Ambulatory Visit
Admission: RE | Admit: 2021-07-30 | Discharge: 2021-07-30 | Disposition: A | Discharge status: Home / Self Care | Payer: 59 | Source: Ambulatory Visit | Attending: Radiation Oncology | Admitting: Radiation Oncology

## 2021-07-30 DIAGNOSIS — C61 Malignant neoplasm of prostate: Secondary | ICD-10-CM | POA: Diagnosis not present

## 2021-07-31 ENCOUNTER — Ambulatory Visit
Admission: RE | Admit: 2021-07-31 | Discharge: 2021-07-31 | Disposition: A | Discharge status: Home / Self Care | Payer: 59 | Source: Ambulatory Visit | Attending: Radiation Oncology | Admitting: Radiation Oncology

## 2021-07-31 DIAGNOSIS — C61 Malignant neoplasm of prostate: Secondary | ICD-10-CM | POA: Diagnosis not present

## 2021-08-01 ENCOUNTER — Ambulatory Visit
Admission: RE | Admit: 2021-08-01 | Discharge: 2021-08-01 | Disposition: A | Discharge status: Home / Self Care | Payer: 59 | Source: Ambulatory Visit | Attending: Radiation Oncology | Admitting: Radiation Oncology

## 2021-08-01 ENCOUNTER — Other Ambulatory Visit: Payer: Self-pay

## 2021-08-01 DIAGNOSIS — C61 Malignant neoplasm of prostate: Secondary | ICD-10-CM | POA: Diagnosis not present

## 2021-08-01 NOTE — Progress Notes (Signed)
° ° °  Future Appointments  Date Time Provider Department  08/02/2021  2:00 PM Unk Pinto, MD [facility]  11/14/2021 10:30 AM Unk Pinto, MD [facility]  05/17/2022 10:00 AM Magda Bernheim, NP [facility]    History of Present Illness:  Patient is a very nice 56 yo DWM  with HTN, HLD, GERD /hxEso Stricture, PreDM, HxProstate Ca & Vit D deficiency who presents 3 weeks post Covid infection with persistent c/o dry hacking tickle cough. Denies dyspnea at rest or with exertion. Systems review is otherwise negative.   Medications    cetirizine (ZYRTEC) 10 MG tablet, Take 10 mg by mouth daily.   allopurinol (ZYLOPRIM) 300 MG tablet, TAKE 1 TABLET BY MOUTH DAILY TO PREVENT GOUT   Minoxidil (MINOXIDIL FOR MEN) 5 % FOAM, Apply to area of thinning hair twice daily. Rinse hands and other skin areas after use.   pantoprazole (PROTONIX) 40 MG tablet, Take 1 tablet (40 mg total) by mouth daily.  Problem list He has ESOPHAGEAL STRICTURE; Hyperlipidemia; GERD (gastroesophageal reflux disease); Medication management; Vitamin D deficiency; Obesity (BMI 30.0-34.9); Labile hypertension; Gilbert's syndrome; Gout; Morbid obesity ([place]); Eosinophilic esophagitis; Other abnormal glucose (prediabetes); Prostate cancer ([place]); Alopecia; and Genetic testing on their problem list.   Observations/Objective:  BP 118/76    Pulse 78    Temp 97.9 F (36.6 C)    Resp 16    Ht 6\' 2"  (1.88 m)    Wt 278 lb (126.1 kg)    SpO2 98%    BMI 35.69 kg/m   HEENT - WNL. Neck - supple.  Chest - Clear equal BS. Patient was ambulated at a  brisk rate for about 75 feet and O2 sat remained at 98%. Cor - Nl HS. RRR w/o sig MGR. PP 1(+). No edema. MS- FROM w/o deformities.  Gait Nl. Neuro -  Nl w/o focal abnormalities.  Assessment and Plan:   1. Post-COVID chronic cough  - dexamethasone (DECADRON) 4 MG tablet; Take 1 tab 3 x day - 3 days, then 2 x day - 3 days, then 1 tab daily  Dispense: 20 tablet; Refill: 0  - benzonatate  (TESSALON) 200 MG capsule; Take 1 perle 3 x / day to prevent cough  Dispense: 90 capsule; Refill: 1  Follow Up Instructions:       I discussed the assessment and treatment plan with the patient. The patient was provided an opportunity to ask questions and all were answered. The patient agreed with the plan and demonstrated an understanding of the instructions.       The patient was advised to call back or seek an in-person evaluation if the symptoms worsen or if the condition fails to improve as anticipated.   Kirtland Bouchard, MD

## 2021-08-02 ENCOUNTER — Ambulatory Visit: Payer: 59 | Admitting: Internal Medicine

## 2021-08-02 ENCOUNTER — Ambulatory Visit
Admission: RE | Admit: 2021-08-02 | Discharge: 2021-08-02 | Disposition: A | Discharge status: Home / Self Care | Payer: 59 | Source: Ambulatory Visit | Attending: Radiation Oncology | Admitting: Radiation Oncology

## 2021-08-02 ENCOUNTER — Encounter: Payer: Self-pay | Admitting: Internal Medicine

## 2021-08-02 VITALS — BP 118/76 | HR 78 | Temp 97.9°F | Resp 16 | Ht 74.0 in | Wt 278.0 lb

## 2021-08-02 DIAGNOSIS — C61 Malignant neoplasm of prostate: Secondary | ICD-10-CM | POA: Diagnosis not present

## 2021-08-02 DIAGNOSIS — U099 Post covid-19 condition, unspecified: Secondary | ICD-10-CM

## 2021-08-02 DIAGNOSIS — R053 Chronic cough: Secondary | ICD-10-CM

## 2021-08-02 MED ORDER — BENZONATATE 200 MG PO CAPS
ORAL_CAPSULE | ORAL | 1 refills | Status: DC
Start: 2021-08-02 — End: 2021-11-14

## 2021-08-02 MED ORDER — DEXAMETHASONE 4 MG PO TABS: ORAL_TABLET | ORAL | 0 refills | Status: DC

## 2021-08-03 ENCOUNTER — Other Ambulatory Visit: Payer: Self-pay

## 2021-08-03 ENCOUNTER — Ambulatory Visit
Admission: RE | Admit: 2021-08-03 | Discharge: 2021-08-03 | Disposition: A | Discharge status: Home / Self Care | Payer: 59 | Source: Ambulatory Visit | Attending: Radiation Oncology | Admitting: Radiation Oncology

## 2021-08-03 DIAGNOSIS — C61 Malignant neoplasm of prostate: Secondary | ICD-10-CM | POA: Diagnosis not present

## 2021-08-04 ENCOUNTER — Encounter: Payer: Self-pay | Admitting: Internal Medicine

## 2021-08-06 ENCOUNTER — Ambulatory Visit
Admission: RE | Admit: 2021-08-06 | Discharge: 2021-08-06 | Disposition: A | Discharge status: Home / Self Care | Payer: 59 | Source: Ambulatory Visit | Attending: Radiation Oncology | Admitting: Radiation Oncology

## 2021-08-06 ENCOUNTER — Other Ambulatory Visit: Payer: Self-pay

## 2021-08-06 DIAGNOSIS — C61 Malignant neoplasm of prostate: Secondary | ICD-10-CM | POA: Diagnosis not present

## 2021-08-07 ENCOUNTER — Ambulatory Visit
Admission: RE | Admit: 2021-08-07 | Discharge: 2021-08-07 | Disposition: A | Discharge status: Home / Self Care | Payer: 59 | Source: Ambulatory Visit | Attending: Radiation Oncology | Admitting: Radiation Oncology

## 2021-08-07 DIAGNOSIS — C61 Malignant neoplasm of prostate: Secondary | ICD-10-CM | POA: Diagnosis not present

## 2021-08-08 ENCOUNTER — Ambulatory Visit
Admission: RE | Admit: 2021-08-08 | Discharge: 2021-08-08 | Disposition: A | Discharge status: Home / Self Care | Payer: 59 | Source: Ambulatory Visit | Attending: Radiation Oncology | Admitting: Radiation Oncology

## 2021-08-08 DIAGNOSIS — C61 Malignant neoplasm of prostate: Secondary | ICD-10-CM | POA: Diagnosis not present

## 2021-08-09 ENCOUNTER — Other Ambulatory Visit: Payer: Self-pay

## 2021-08-09 ENCOUNTER — Ambulatory Visit
Admission: RE | Admit: 2021-08-09 | Discharge: 2021-08-09 | Disposition: A | Discharge status: Home / Self Care | Payer: 59 | Source: Ambulatory Visit | Attending: Radiation Oncology | Admitting: Radiation Oncology

## 2021-08-09 DIAGNOSIS — C61 Malignant neoplasm of prostate: Secondary | ICD-10-CM | POA: Diagnosis not present

## 2021-08-10 ENCOUNTER — Ambulatory Visit
Admission: RE | Admit: 2021-08-10 | Discharge: 2021-08-10 | Disposition: A | Discharge status: Home / Self Care | Payer: 59 | Source: Ambulatory Visit | Attending: Radiation Oncology | Admitting: Radiation Oncology

## 2021-08-10 ENCOUNTER — Other Ambulatory Visit: Payer: Self-pay

## 2021-08-10 DIAGNOSIS — C61 Malignant neoplasm of prostate: Secondary | ICD-10-CM | POA: Diagnosis not present

## 2021-08-13 ENCOUNTER — Encounter: Payer: Self-pay | Admitting: Urology

## 2021-08-13 ENCOUNTER — Ambulatory Visit
Admission: RE | Admit: 2021-08-13 | Discharge: 2021-08-13 | Disposition: A | Discharge status: Home / Self Care | Payer: 59 | Source: Ambulatory Visit | Attending: Radiation Oncology | Admitting: Radiation Oncology

## 2021-08-13 ENCOUNTER — Other Ambulatory Visit: Payer: Self-pay

## 2021-08-13 DIAGNOSIS — C61 Malignant neoplasm of prostate: Secondary | ICD-10-CM | POA: Diagnosis not present

## 2021-08-13 NOTE — Progress Notes (Signed)
Congratulatory phone call given to patient as today is his last radiation treatment.  RN assessed for any navigation needs or side effects.  Pt does report some leaking, but is understanding this is a side effect of radiation.  No further needs at this time.  RN encouraged patient to call with any questions or concerns that may arise.

## 2021-08-27 ENCOUNTER — Telehealth: Payer: Self-pay | Admitting: *Deleted

## 2021-08-27 NOTE — Telephone Encounter (Signed)
RETURNED PATIENT'S PHONE CALL, SPOKE WITH PATIENT. ?

## 2021-08-29 ENCOUNTER — Other Ambulatory Visit: Payer: Self-pay | Admitting: Internal Medicine

## 2021-08-29 DIAGNOSIS — U099 Post covid-19 condition, unspecified: Secondary | ICD-10-CM

## 2021-08-31 ENCOUNTER — Other Ambulatory Visit: Payer: Self-pay | Admitting: Internal Medicine

## 2021-08-31 DIAGNOSIS — U099 Post covid-19 condition, unspecified: Secondary | ICD-10-CM

## 2021-08-31 DIAGNOSIS — R053 Chronic cough: Secondary | ICD-10-CM

## 2021-09-10 ENCOUNTER — Encounter: Payer: Self-pay | Admitting: Urology

## 2021-09-10 NOTE — Progress Notes (Signed)
Spoke w/ patient, verified identity, and began nursing interview. Patient reports mild urinary incontinence, w/ an I-PSS score of 8 (moderate). No other issues reported at this time. ? ?Meaningful use complete. ?No urinary management medications ?Urology appointment- None -per patient ? ?Reminded patient of his 2:00pm-09/13/21 telephone appointment w/ Ashlyn Bruning PA-C. I left my extension [phone] in case patient needs anything. Patient verbalized understanding of information. ? ?Patient contact [phone] ?

## 2021-09-13 ENCOUNTER — Ambulatory Visit
Admission: RE | Admit: 2021-09-13 | Discharge: 2021-09-13 | Disposition: A | Discharge status: Home / Self Care | Payer: 59 | Source: Ambulatory Visit | Attending: Urology | Admitting: Urology

## 2021-09-13 ENCOUNTER — Ambulatory Visit: Payer: Self-pay | Admitting: Urology

## 2021-09-13 DIAGNOSIS — C61 Malignant neoplasm of prostate: Secondary | ICD-10-CM

## 2021-09-13 NOTE — Progress Notes (Signed)
°  Radiation Oncology         [phone] ________________________________  Name: [name] MRN: [id number]  Date: 08/13/2021  DOB: 08-Jun-1966  End of Treatment Note  Diagnosis:   57 y.o. gentleman with postoperative PSA of 0.56 and high risk features s/p RALP 01/2021 for stage pT3a, pN0 Gleason 4+5 prostate cancer.     Indication for treatment:  Curative, Definitive Radiotherapy; concurrent with ST-ADT       Radiation treatment dates:   06/19/21 - 08/13/21  Site/dose:  1. The prostate fossa and pelvic lymph nodes were initially treated to 45 Gy in 25 fractions of 1.8 Gy  2. The prostate fossa only was boosted to 68.4 Gy with 13 additional fractions of 1.8 Gy   Beams/energy:  1. The prostate fossa  and pelvic lymph nodes were initially treated using VMAT intensity modulated radiotherapy delivering 6 megavolt photons. Image guidance was performed with CB-CT studies prior to each fraction. He was immobilized with a body fix lower extremity mold.  2. The prostate fossa only was boosted using VMAT intensity modulated radiotherapy delivering 6 megavolt photons. Image guidance was performed with CB-CT studies prior to each fraction. He was immobilized with a body fix lower extremity mold.  Narrative: The patient tolerated radiation treatment relatively well with only minor urinary irritation and modest fatigue.  He did report increased frequency, urgency and occasional urge incontinence.  He also experienced some diarrhea which was managed with Imodium as needed.  Plan: The patient has completed radiation treatment. He will return to radiation oncology clinic for routine followup in one month. I advised him to call or return sooner if he has any questions or concerns related to his recovery or treatment. ________________________________  Sheral Apley. Tammi Klippel, M.D.

## 2021-09-13 NOTE — Progress Notes (Signed)
?Radiation Oncology         [phone]) [phone] ?________________________________ ? ?Name: [name] MRN: [id number]  ?Date: 09/13/2021  DOB: 03/14/1966 ? ?Post Treatment Note ? ?CC: Unk Pinto, MD  Raynelle Bring, MD ? ?Diagnosis:   56 y.o. gentleman with postoperative PSA of 0.56 and high risk features s/p RALP 01/2021 for stage pT3a, pN0 Gleason 4+5 prostate cancer. ? ?Interval Since Last Radiation:  4.5 weeks (concurrent with ST-ADT; 6 month Eligard 06/04/22) ?06/19/21 - 08/13/21: ?1. The prostate fossa and pelvic lymph nodes were initially treated to 45 Gy in 25 fractions of 1.8 Gy  ?2. The prostate fossa only was boosted to 68.4 Gy with 13 additional fractions of 1.8 Gy  ?  ?Narrative:  I spoke with the patient to conduct his routine scheduled 1 month follow up visit via telephone to spare the patient unnecessary potential exposure in the healthcare setting during the current COVID-19 pandemic.  The patient was notified in advance and gave permission to proceed with this visit format. ? ?He tolerated radiation treatment relatively well with only minor urinary irritation and modest fatigue.  He did report increased frequency, urgency and occasional urge incontinence.  He also experienced some diarrhea which was managed with Imodium as needed.                             ? ?On review of systems, the patient states that he is doing very well overall.  His LUTS are gradually improving and almost back to baseline at this point.  He specifically denies dysuria, gross hematuria, excessive daytime frequency, urgency, straining to void or incomplete bladder emptying.  He has had a couple of episodes of nocturnal enuresis but denies any daytime incontinence aside from the mild residual SUI present since the time of surgery.  He denies abdominal pain, nausea, vomiting, diarrhea or constipation.  He reports a healthy appetite and is maintaining his weight.  He is tolerating the ADT fairly well aside from hot flashes,  decreased libido and moderate fatigue.  However, he has continued to stay active and overall, is quite pleased with his progress to date. ? ?ALLERGIES:  has No Known Allergies. ? ?Meds: ?Current Outpatient Medications  ?Medication Sig Dispense Refill  ? allopurinol (ZYLOPRIM) 300 MG tablet TAKE 1 TABLET BY MOUTH DAILY TO PREVENT GOUT 90 tablet 1  ? benzonatate (TESSALON) 200 MG capsule Take 1 perle 3 x / day to prevent cough 90 capsule 1  ? cetirizine (ZYRTEC) 10 MG tablet Take 10 mg by mouth daily. (Patient not taking: Reported on 08/02/2021)    ? dexamethasone (DECADRON) 4 MG tablet TAKE 1 TABLET 3 TIMES A DAY FOR 3 DAYS THEN 1 TABLET TWICE A DAY FOR 3 DAYS, THEN 1 TABLET DAILY 20 tablet 0  ? Minoxidil (MINOXIDIL FOR MEN) 5 % FOAM Apply to area of thinning hair twice daily. Rinse hands and other skin areas after use. 60 g 1  ? pantoprazole (PROTONIX) 40 MG tablet Take 1 tablet (40 mg total) by mouth daily. 90 tablet 4  ? ?No current facility-administered medications for this encounter.  ? ? ?Physical Findings: ? vitals were not taken for this visit.  ?Pain Assessment ?Pain Score: 0-No pain/10 ?Unable to assess due to telephone follow-up visit format. ? ?Lab Findings: ?Lab Results  ?Component Value Date  ? WBC 7.6 05/17/2021  ? HGB 13.9 05/17/2021  ? HCT 42.3 05/17/2021  ? MCV 85.6 05/17/2021  ?  PLT 250 05/17/2021  ? ? ? ?Radiographic Findings: ?No results found. ? ?Impression/Plan: ?1. 56 y.o. gentleman with postoperative PSA of 0.56 and high risk features s/p RALP 01/2021 for stage pT3a, pN0 Gleason 4+5 prostate cancer. ?He will continue to follow up with urology for ongoing PSA determinations and has an appointment scheduled for lab work on 11/28/2021 and will have an office visit with Dr. Alinda Money that following week. He understands what to expect with regards to PSA monitoring going forward. I will look forward to following his response to treatment via correspondence with urology, and would be happy to continue to  participate in his care if clinically indicated. I talked to the patient about what to expect in the future, including his risk for erectile dysfunction and rectal bleeding. I encouraged him to call or return to the office if he has any questions regarding his previous radiation or possible radiation side effects. He was comfortable with this plan and will follow up as needed.  ? ? ? ?Nicholos Johns, PA-C  ?

## 2021-09-14 ENCOUNTER — Other Ambulatory Visit: Payer: Self-pay | Admitting: Adult Health

## 2021-09-14 DIAGNOSIS — M1A9XX0 Chronic gout, unspecified, without tophus (tophi): Secondary | ICD-10-CM

## 2021-09-17 ENCOUNTER — Other Ambulatory Visit: Payer: Self-pay | Admitting: Urology

## 2021-09-17 DIAGNOSIS — C61 Malignant neoplasm of prostate: Secondary | ICD-10-CM

## 2021-11-14 ENCOUNTER — Ambulatory Visit: Payer: 59 | Admitting: Internal Medicine

## 2021-11-14 ENCOUNTER — Encounter: Payer: Self-pay | Admitting: Internal Medicine

## 2021-11-14 VITALS — BP 116/72 | HR 83 | Temp 97.9°F | Resp 16 | Ht 74.0 in | Wt 276.4 lb

## 2021-11-14 DIAGNOSIS — R7309 Other abnormal glucose: Secondary | ICD-10-CM | POA: Diagnosis not present

## 2021-11-14 DIAGNOSIS — E559 Vitamin D deficiency, unspecified: Secondary | ICD-10-CM | POA: Diagnosis not present

## 2021-11-14 DIAGNOSIS — R0989 Other specified symptoms and signs involving the circulatory and respiratory systems: Secondary | ICD-10-CM

## 2021-11-14 DIAGNOSIS — E782 Mixed hyperlipidemia: Secondary | ICD-10-CM | POA: Diagnosis not present

## 2021-11-14 DIAGNOSIS — Z79899 Other long term (current) drug therapy: Secondary | ICD-10-CM

## 2021-11-14 NOTE — Progress Notes (Signed)
? ?Future Appointments  ?Date Time Provider Department  ?11/14/2021             6 mo OV  10:30 AM Unk Pinto, MD [facility]  ?11/15/2021 10:00 AM Harmon Pier, RN [facility]  ?05/17/2022             CPE  10:00 AM Magda Bernheim, NP [facility]  ? ?History of Present Illness: ? ? ?    This very nice 56 y.o.  DWM  with hx/o labile  HTN, HLD, GERD /hxEso Stricture, PreDM  & Vit D presents for 6 month follow up. Patient has hx/o Gout quiescent on allopurinol.  Likewise his GERD is controlled on his Pantoprazole.  ? ?     Patient  had recent Robotic Radical  Proctectomy for Prostate Ca (Gleason 9) by Dr Alinda Money in July 2022  & is followed by Dr Jeffie Pollock very closely with Dr Tammi Klippel who administered his  post -op Radiation Therapy and Dr Alen Blew managing his androgen deprivation therapy.  ? ?    Patient is followed expectantly with hx/o labile HTN  since  & BP has been controlled and today?s BP is at goal  -  116/72. Patient has had no complaints of any cardiac type chest pain, palpitations, dyspnea / orthopnea / PND, dizziness, claudication, or dependent edema. ? ? ?    Hyperlipidemia is controlled with diet & meds. Patient denies myalgias or other med SE?s. Last Lipids were near goal : ? ?Lab Results  ?Component Value Date  ? CHOL 165 05/17/2021  ? HDL 36 (L) 05/17/2021  ? LDLCALC 102 (H) 05/17/2021  ? TRIG 176 (H) 05/17/2021  ? CHOLHDL 4.6 05/17/2021  ? ? ? ?Also, the patient has history of  Morbid Obesity (BMI 35.6+) and consequent PreDiabetes  (A1c 5.8% / Insulin 137 / 2017) and has had no symptoms of reactive hypoglycemia, diabetic polys, paresthesias or visual blurring.  Last A1c was not at goal : ? ?Lab Results  ?Component Value Date  ? HGBA1C 6.0 (H) 05/17/2021  ? ?    ? ?                                                     Further, the patient also has history of Vitamin D Deficiency  ("26" /2016) and supplements Vitamin D. Last vitamin D was still very low : ? ?Lab Results  ?Component Value Date  ? VD25OH 41  05/17/2021  ? ? ? ?Current Outpatient Medications on File Prior to Visit  ?Medication Sig  ? allopurinol 300 MG tablet TAKE 1 TABLET DAILY TO PREVENT GOUT  ? Minoxidil  5 % FOAM Apply to area of thinning hair twice daily  ? pantoprazole 40 MG tablet Take 1 tablet  daily.  ? ? ?No Known Allergies ? ? ?PMHx:   ?Past Medical History:  ?Diagnosis Date  ? Allergy   ? Anemia   ? Anxiety   ? DDD (degenerative disc disease)   ? GERD (gastroesophageal reflux disease)   ? Gilbert's syndrome   ? pt denies dx  ? Gout   ? Hyperlipidemia   ? Hypertension   ? Low back pain   ? Prostate cancer ([place])   ? Tubular adenoma of colon 06/2015  ? ? ? ?Immunization History  ?Administered Date(s) Administered  ? Influenza Inj  Mdck Quad 04/12/2018  ? Influenza,inj,Quad  04/05/2019  ? Influenza 04/12/2018  ? Pneumococcal-23 11/06/1995  ? Td 11/01/2004  ? Tdap 03/01/2015  ? ? ? ?Past Surgical History:  ?Procedure Laterality Date  ? CHOLECYSTECTOMY    ? CHOLESTEATOMA EXCISION    ? LYMPHADENECTOMY Bilateral 01/29/2021  ? Procedure: LYMPHADENECTOMY, PELVIC;  Surgeon: Raynelle Bring, MD;  Location: [facility];  Service: Urology;  Laterality: Bilateral;  ? PROSTATE BIOPSY    ? prostrate  01/2021  ? ROBOT ASSISTED LAPAROSCOPIC RADICAL PROSTATECTOMY N/A 01/29/2021  ? Procedure: XI ROBOTIC ASSISTED LAPAROSCOPIC RADICAL PROSTATECTOMY LEVEL 2;  Surgeon: Raynelle Bring, MD;  Location: [facility];  Service: Urology;  Laterality: N/A;  ? UPPER GASTROINTESTINAL ENDOSCOPY  07/15/2009  ? VASECTOMY    ? WISDOM TOOTH EXTRACTION Bilateral   ? ? ?FHx:    Reviewed / unchanged ? ?SHx:    Reviewed / unchanged  ? ?Systems Review: ? ?Constitutional: Denies fever, chills, wt changes, headaches, insomnia, fatigue, night sweats, change in appetite. ?Eyes: Denies redness, blurred vision, diplopia, discharge, itchy, watery eyes.  ?ENT: Denies discharge, congestion, post nasal drip, epistaxis, sore throat, earache, hearing loss, dental pain, tinnitus, vertigo, sinus pain, snoring.   ?CV: Denies chest pain, palpitations, irregular heartbeat, syncope, dyspnea, diaphoresis, orthopnea, PND, claudication or edema. ?Respiratory: denies cough, dyspnea, DOE, pleurisy, hoarseness, laryngitis, wheezing.  ?Gastrointestinal: Denies dysphagia, odynophagia, heartburn, reflux, water brash, abdominal pain or cramps, nausea, vomiting, bloating, diarrhea, constipation, hematemesis, melena, hematochezia  or hemorrhoids. ?Genitourinary: Denies dysuria, frequency, urgency, nocturia, hesitancy, discharge, hematuria or flank pain. ?Musculoskeletal: Denies arthralgias, myalgias, stiffness, jt. swelling, pain, limping or strain/sprain.  ?Skin: Denies pruritus, rash, hives, warts, acne, eczema or change in skin lesion(s). ?Neuro: No weakness, tremor, incoordination, spasms, paresthesia or pain. ?Psychiatric: Denies confusion, memory loss or sensory loss. ?Endo: Denies change in weight, skin or hair change.  ?Heme/Lymph: No excessive bleeding, bruising or enlarged lymph nodes. ? ?Physical Exam ? ?BP 116/72   Pulse 83   Temp 97.9 ?F (36.6 ?C)   Resp 16   Ht '6\' 2"'$  (1.88 m)   Wt 276 lb 6.4 oz (125.4 kg)   SpO2 96%   BMI 35.49 kg/m?  ? ?Appears  well nourished, well groomed  and in no distress. ? ?Eyes: PERRLA, EOMs, conjunctiva no swelling or erythema. ?Sinuses: No frontal/maxillary tenderness ?ENT/Mouth: EAC's clear, TM's nl w/o erythema, bulging. Nares clear w/o erythema, swelling, exudates. Oropharynx clear without erythema or exudates. Oral hygiene is good. Tongue normal, non obstructing. Hearing intact.  ?Neck: Supple. Thyroid not palpable. Car 2+/2+ without bruits, nodes or JVD. ?Chest: Respirations nl with BS clear & equal w/o rales, rhonchi, wheezing or stridor.  ?Cor: Heart sounds normal w/ regular rate and rhythm without sig. murmurs, gallops, clicks or rubs. Peripheral pulses normal and equal  without edema.  ?Abdomen: Soft & bowel sounds normal. Non-tender w/o guarding, rebound, hernias, masses or  organomegaly.  ?Lymphatics: Unremarkable.  ?Musculoskeletal: Full ROM all peripheral extremities, joint stability, 5/5 strength and normal gait.  ?Skin: Warm, dry without exposed rashes, lesions or ecchymosis apparent.  ?Neuro: Cranial nerves intact, reflexes equal bilaterally. Sensory-motor testing grossly intact. Tendon reflexes grossly intact.  ?Pysch: Alert & oriented x 3.  Insight and judgement nl & appropriate. No ideations. ? ?Assessment and Plan: ? ?1. Labile hypertension ? ?- Continue medication, monitor blood pressure at home.  ?- Continue DASH diet.  Reminder to go to the ER if any CP,  ?SOB, nausea, dizziness, severe HA, changes vision/speech. ?   ?-  CBC with Differential/Platelet ?- COMPLETE METABOLIC PANEL WITH GFR ?- Magnesium ?- TSH ? ?2. Hyperlipidemia, mixed ? ?- Continue diet/meds, exercise,& lifestyle modifications.  ?- Continue monitor periodic cholesterol/liver & renal functions  ?   ?- Lipid panel ?- TSH ? ?3. Abnormal glucose ? ?- Continue diet, exercise  ?- Lifestyle modifications.  ?- Monitor appropriate labs ?   ?- Hemoglobin A1c ?- Insulin, random ? ?4. Vitamin D deficiency ? ?- Continue supplementation ?   ?- VITAMIN D 25 Hydroxy  ? ?5. Medication management ? ?- CBC with Differential/Platelet ?- COMPLETE METABOLIC PANEL WITH GFR ?- Magnesium ?- Lipid panel ?- TSH ?- Hemoglobin A1c ?- Insulin, random ?- VITAMIN D 25 Hydroxy  ? ? ?      Discussed  regular exercise, BP monitoring, weight control to achieve/maintain BMI less than 25 and discussed med and SE's. Recommended labs to assess /monitor clinical status .  I discussed the assessment and treatment plan with the patient. The patient was provided an opportunity to ask questions and all were answered. The patient agreed with the plan and demonstrated an understanding of the instructions.  I provided over 30 minutes of exam, counseling, chart review and  complex critical decision making. ? ?      The patient was advised to call back or  seek an in-person evaluation if the symptoms worsen or if the condition fails to improve as anticipated. ? ? ?Kirtland Bouchard, MD ? ?

## 2021-11-14 NOTE — Patient Instructions (Signed)

## 2021-11-15 ENCOUNTER — Inpatient Hospital Stay: Payer: 59 | Attending: Adult Health | Admitting: *Deleted

## 2021-11-15 ENCOUNTER — Encounter: Payer: Self-pay | Admitting: *Deleted

## 2021-11-15 VITALS — Ht 74.0 in | Wt 276.5 lb

## 2021-11-15 DIAGNOSIS — C61 Malignant neoplasm of prostate: Secondary | ICD-10-CM

## 2021-11-15 LAB — LIPID PANEL
Cholesterol: 174 mg/dL (ref <200)
HDL: 35 mg/dL — ABNORMAL LOW (ref >=40)
LDL Cholesterol (Calc): 106 mg/dL (calc) — ABNORMAL HIGH
Non-HDL Cholesterol (Calc): 139 mg/dL (calc) — ABNORMAL HIGH (ref <130)
Total CHOL/HDL Ratio: 5 (calc) — ABNORMAL HIGH (ref <5.0)
Triglycerides: 209 mg/dL — ABNORMAL HIGH (ref <150)

## 2021-11-15 LAB — COMPLETE METABOLIC PANEL WITH GFR
AG Ratio: 1.9 (calc) (ref 1.0–2.5)
ALT: 21 U/L (ref 9–46)
AST: 22 U/L (ref 10–35)
Albumin: 3.9 g/dL (ref 3.6–5.1)
Alkaline phosphatase (APISO): 81 U/L (ref 35–144)
BUN: 13 mg/dL (ref 7–25)
CO2: 28 mmol/L (ref 20–32)
Calcium: 9.1 mg/dL (ref 8.6–10.3)
Chloride: 106 mmol/L (ref 98–110)
Creat: 1.08 mg/dL (ref 0.70–1.30)
Globulin: 2.1 g/dL (calc) (ref 1.9–3.7)
Glucose, Bld: 111 mg/dL — ABNORMAL HIGH (ref 65–99)
Potassium: 4.2 mmol/L (ref 3.5–5.3)
Sodium: 143 mmol/L (ref 135–146)
Total Bilirubin: 0.5 mg/dL (ref 0.2–1.2)
Total Protein: 6 g/dL — ABNORMAL LOW (ref 6.1–8.1)
eGFR: 80 mL/min/{1.73_m2} (ref >=60)

## 2021-11-15 LAB — CBC WITH DIFFERENTIAL/PLATELET
Absolute Monocytes: 683 cells/uL (ref 200–950)
Basophils Absolute: 60 cells/uL (ref 0–200)
Basophils Relative: 0.9 %
Eosinophils Absolute: 556 cells/uL — ABNORMAL HIGH (ref 15–500)
Eosinophils Relative: 8.3 %
HCT: 38.3 % — ABNORMAL LOW (ref 38.5–50.0)
Hemoglobin: 12.8 g/dL — ABNORMAL LOW (ref 13.2–17.1)
Lymphs Abs: 1045 cells/uL (ref 850–3900)
MCH: 30 pg (ref 27.0–33.0)
MCHC: 33.4 g/dL (ref 32.0–36.0)
MCV: 89.9 fL (ref 80.0–100.0)
MPV: 9.7 fL (ref 7.5–12.5)
Monocytes Relative: 10.2 %
Neutro Abs: 4355 cells/uL (ref 1500–7800)
Neutrophils Relative %: 65 %
Platelets: 230 10*3/uL (ref 140–400)
RBC: 4.26 10*6/uL (ref 4.20–5.80)
RDW: 13.2 % (ref 11.0–15.0)
Total Lymphocyte: 15.6 %
WBC: 6.7 10*3/uL (ref 3.8–10.8)

## 2021-11-15 LAB — TSH: TSH: 3.18 mIU/L (ref 0.40–4.50)

## 2021-11-15 LAB — HEMOGLOBIN A1C
Hgb A1c MFr Bld: 6.2 % of total Hgb — ABNORMAL HIGH (ref <5.7)
Mean Plasma Glucose: 131 mg/dL
eAG (mmol/L): 7.3 mmol/L

## 2021-11-15 LAB — MAGNESIUM: Magnesium: 2 mg/dL (ref 1.5–2.5)

## 2021-11-15 LAB — VITAMIN D 25 HYDROXY (VIT D DEFICIENCY, FRACTURES): Vit D, 25-Hydroxy: 34 ng/mL (ref 30–100)

## 2021-11-15 LAB — INSULIN, RANDOM: Insulin: 49 u[IU]/mL — ABNORMAL HIGH

## 2021-11-15 NOTE — Progress Notes (Signed)
2 identifiers used for verification purposes for appointment. Pt's vitals were not taken as this was a telephone visit. Pt denies pain / nor fatigue today. Pt continues to go to restroom every 2 hours at night to void. He does have some occurrences of urinary incontinence while asleep. He states this happens about every 3 weeks. Completed pelvic therapy at [facility]. He does feel like he's emptying his bladder completely with each void.Pt has urgency at times with bowel movements and he's says it a much looser stool, but not diarrhea.Pt continues to have hotflashes and night sweats from ADT therapy, but tolerable. ?Last visit with PCP was 11/14/2021. Last colonoscopy was 12/16. Colonguard in 2021. He will get a repeat colonoscopy before end of year. Vaccines updated. I discussed Nutrition Rainbow with pt as well as the importance of starting to do some form of exercise. SCP reviewed and completed. ?

## 2021-11-15 NOTE — Progress Notes (Signed)
<><><><><><><><><><><><><><><><><><><><><><><><><><><><><><><><><> ?<><><><><><><><><><><><><><><><><><><><><><><><><><><><><><><><><> ?- Test results slightly outside the reference range are not unusual. ?If there is anything important, I will review this with you,  ?otherwise it is considered normal test values.  ?If you have further questions,  ?please do not hesitate to contact me at the office or via My Chart.  ?<><><><><><><><><><><><><><><><><><><><><><><><><><><><><><><><><> ?<><><><><><><><><><><><><><><><><><><><><><><><><><><><><><><><><> ?? ?- Total Chol = ? 174    is very GOOD & low risk for Heart Attack /Stroke /Vascular Dementia ??? ? ? ? ( ?Ideal or Goal is less than 180 ?! ?)  ?BUT . . . . . ?- Bad /Dangerous      LDL Chol =  106   ?ie.,  too high & is elevated risk.  ??? ? ? ? ?( ?Ideal or Goal is less than 70 ?! ?)  ?? ?- Treating with meds to lower Cholesterol is treating the result  ??? ? ?? ? ? ? ? ? ? ? ? ? ? ? ? ? ? ? ? ? ? ? ? ? ? ? ? ? ? ? ? ? ? ? ? ? ? ? ?  & NOT ?treating the cause ?- The cause is Bad Diet ! ?? ?- Read or listen to ? ? ?Dr Wyman Songster 's book ? ? ? " How Not to Die ! "  ?? ?- Recommend ?a stricter plant based low cholesterol diet  ? ? ?- Cholesterol only comes from animal sources  ?- ie. meat, dairy, egg yolks ? ? ?- Eat all the vegetables you want. ? ? ?-   Avoid Meat,  Avoid Meat ,  Avoid Meat  - especially Red Meat - Beef AND Pork ? ?- Avoid cheese & dairy - milk & ice cream ? ?- Cheese is the most concentrated form of trans-fats which  ?                                                              is the worst thing to clog up our arteries.  ? ? ?- Veggie cheese is OK which can be found in the fresh  ?produce section at [employer] or [employer] or [employer] ?<><><><><><><><><><><><><><><><><><><><><><><><><><><><><><><><><> ? ?- As a last resort, you can take medicines if you refuse to improve your diet   ?<><><><><><><><><><><><><><><><><><><><><><><><><><><><><><><><><> ?<><><><><><><><><><><><><><><><><><><><><><><><><><><><><><><><><> ? ?- Also, Triglycerides (  209   ) or fats in blood are too high  ?                      (  Ideal  or Goal is less than 150 )   ? ?- Recommend avoid fried & greasy foods,  sweets / candy,  ? ?- Avoid white rice  ?(brown or wild rice or Quinoa is OK),  ? ?- Avoid white potatoes  ?(sweet potatoes are OK)  ? ?- Avoid anything made from white flour  ?- bagels, doughnuts, rolls, buns, biscuits, white and  ? ?wheat breads, pizza crust and traditional  ?pasta made of white flour & egg white ? ?- (vegetarian pasta or spinach or wheat pasta is OK).   ? ?- Multi-grain bread is OK - like multi-grain flat bread or ? sandwich thins.  ? ?- Avoid alcohol in excess.  ? ?- Exercise is also important. ?<><><><><><><><><><><><><><><><><><><><><><><><><><><><><><><><><> ?<><><><><><><><><><><><><><><><><><><><><><><><><><><><><><><><><> ? ?- A1c =  6.2%    -   Blood sugar and A1c are elevated in the borderline and  ?early or pre-diabetes range which has the same  ? ?300% increased risk for heart attack, stroke, cancer and  ?                                                  alzheimer- type vascular dementia  ?                                                                                          as full blown diabetes.  ? ?But the good news is that diet, exercise with weight loss can  ?                                                                       cure the early diabetes at this point. ?<><><><><><><><><><><><><><><><><><><><><><><><><><><><><><><><><> ? ?-  Elevated Insulin = 49.0)   (  Normal is less than 20  )  ?                                                                       shows insulin resistance  ? ?- a sign of early diabetes and associated with a 300 % greater risk for  ? ?      heart attacks, strokes, cancer & Alzheimer type vascular dementia  ? ? ?- All this can be cured  and  prevented with losing weight  ? ?- get Dr Fara Olden Fuhrman's book 'the End of Diabetes" and "the End of Dieting"   ? ?- and add many years of good health to your life. ?<><><><><><><><><><><><><><><><><><><><><><><><><><><><><><><><><> ? ?- Vitamin D = 34  is Very Low  ? ?- Vitamin D goal is between 70-100.  ? ?- Please INCREASE your Vitamin D to  ?                                                        2 capsules  5,000 units  =  10,000 units  /day  ? ?- It is very important as a natural anti-inflammatory and helping the  ?                          immune system protect against viral infections,  like the Covid-19  ? ? ?helping hair, skin, and nails, as well as reducing stroke and heart attack risk.  ? ?- It helps your bones and helps with mood. ? ?- It also decreases numerous cancer risks , so please take it as directed.  ? ?- Low Vit D is associated with a 200-300% higher risk for CANCER  ? ?and 200-300% higher risk for HEART   ATTACK  &  STROKE.   ? ?- It is also associated with higher death rate at younger ages,  ? ?autoimmune diseases like Rheumatoid arthritis, Lupus,  ?Multiple Sclerosis.    ? ?- Also many other serious conditions, like depression, Alzheimer's ? ?Dementia, infertility, muscle aches, fatigue, fibromyalgia  ? ?- just to name a few. ?<><><><><><><><><><><><><><><><><><><><><><><><><><><><><><><><><> ? ?- All Else - CBC - Kidneys - Electrolytes - Liver - Magnesium & Thyroid   ? ?- all  Normal / OK ?<><><><><><><><><><><><><><><><><><><><><><><><><><><><><><><><><> ?<><><><><><><><><><><><><><><><><><><><><><><><><><><><><><><><><> ? ? ? ? ? ? ? ? ? ? ? ? ? ? ? ? ?

## 2021-11-26 NOTE — Progress Notes (Signed)
Patient presented to [facility] on 05/25/2021 for high risk features s/p RALP 01/2021 for stage pT3a, pN0 Gleason 4+5 prostate cancer.  ? ?Patient has since completed 7.5 weeks of salvage radiation and ST-ADT.   ? ?Patient is scheduled for post treatment PSA on 5/19, and follow up with Dr. Alinda Money.   ?

## 2022-01-08 ENCOUNTER — Other Ambulatory Visit: Payer: Self-pay | Admitting: Gastroenterology

## 2022-01-24 ENCOUNTER — Ambulatory Visit: Payer: 59 | Admitting: Nurse Practitioner

## 2022-01-24 ENCOUNTER — Encounter: Payer: Self-pay | Admitting: Nurse Practitioner

## 2022-01-24 VITALS — BP 114/70 | HR 69 | Temp 97.6°F | Ht 74.0 in | Wt 276.0 lb

## 2022-01-24 DIAGNOSIS — R0989 Other specified symptoms and signs involving the circulatory and respiratory systems: Secondary | ICD-10-CM

## 2022-01-24 DIAGNOSIS — M7711 Lateral epicondylitis, right elbow: Secondary | ICD-10-CM

## 2022-01-24 MED ORDER — PREDNISONE 20 MG PO TABS: ORAL_TABLET | ORAL | 0 refills | Status: AC

## 2022-01-24 MED ORDER — MELOXICAM 7.5 MG PO TABS: 7.5000 mg | ORAL_TABLET | Freq: Every day | ORAL | 2 refills | Status: DC

## 2022-01-24 NOTE — Progress Notes (Signed)
Assessment and Plan:  [name] was seen today for acute visit.  Diagnoses and all orders for this visit:  Lateral epicondylitis of right elbow Continue wearing brace. Meloxicam daily for 7 days, Prednisone taper If no relief after 10 days notify the office and will refer to ortho for possible injection -     meloxicam (MOBIC) 7.5 MG tablet; Take 1 tablet (7.5 mg total) by mouth daily. -     predniSONE (DELTASONE) 20 MG tablet; 3 tablets daily with food for 3 days, 2 tabs daily for 3 days, 1 tab a day for 5 days.   Labile hypertension Well controlled without medication, continue DASH diet and monitor BP If BP consistently greater than 130/80 notify the office    Further disposition pending results of labs. Discussed med's effects and SE's.   Over 30 minutes of exam, counseling, chart review, and critical decision making was performed.   Future Appointments  Date Time Provider [place]  05/17/2022 10:00 AM Alycia Rossetti, NP [facility] None    ------------------------------------------------------------------------------------------------------------------   HPI BP 114/70   Pulse 69   Temp 97.6 F (36.4 C)   Ht '6\' 2"'$  (1.88 m)   Wt 276 lb (125.2 kg)   SpO2 98%   BMI 35.44 kg/m   56 y.o.male presents for evaluation of weakness in his right arm. Had been doing sanding on and off for a few weeks and noticed his grip strength is not as good in right hand. Originally started pain at elbow, he will have pain that goes down right arm and into 4th and 5th right digit. Denies any numbness and tingling.   He does have a history of labile hypertension.  Has been well controlled currently without medication.  Denies headaches, chest pain, shortness of breath and dizziness.  BP Readings from Last 3 Encounters:  01/24/22 114/70  11/14/21 116/72  08/02/21 118/76   BMI is Body mass index is 35.44 kg/m., he has been working on diet and exercise. Wt Readings from Last 3  Encounters:  01/24/22 276 lb (125.2 kg)  11/15/21 276 lb 7.3 oz (125.4 kg)  11/14/21 276 lb 6.4 oz (125.4 kg)     Past Medical History:  Diagnosis Date   Allergy    Anemia    Anxiety    DDD (degenerative disc disease)    GERD (gastroesophageal reflux disease)    Gilbert's syndrome    pt denies dx   Gout    Hyperlipidemia    Hypertension    Low back pain    Prostate cancer ([place])    Tubular adenoma of colon 06/2015     No Known Allergies  Current Outpatient Medications on File Prior to Visit  Medication Sig   allopurinol (ZYLOPRIM) 300 MG tablet TAKE 1 TABLET BY MOUTH DAILY TO PREVENT GOUT   cetirizine (ZYRTEC) 10 MG tablet Take 10 mg by mouth daily.   Cholecalciferol (VITAMIN D3) 50 MCG (2000 UT) TABS Take by mouth.   pantoprazole (PROTONIX) 40 MG tablet TAKE 1 TABLET BY MOUTH EVERY DAY   vitamin B-12 (CYANOCOBALAMIN) 100 MCG tablet Take 100 mcg by mouth daily.   Minoxidil (MINOXIDIL FOR MEN) 5 % FOAM Apply to area of thinning hair twice daily. Rinse hands and other skin areas after use. (Patient not taking: Reported on 11/14/2021)   No current facility-administered medications on file prior to visit.    ROS: all negative except above.   Physical Exam:  BP 114/70   Pulse 69  Temp 97.6 F (36.4 C)   Ht '6\' 2"'$  (1.88 m)   Wt 276 lb (125.2 kg)   SpO2 98%   BMI 35.44 kg/m   General Appearance: Well nourished, in no apparent distress. Eyes: PERRLA, EOMs, conjunctiva no swelling or erythema Sinuses: No Frontal/maxillary tenderness ENT/Mouth: Ext aud canals clear, TMs without erythema, bulging. No erythema, swelling, or exudate on post pharynx.  Tonsils not swollen or erythematous. Hearing normal.  Neck: Supple, thyroid normal.  Respiratory: Respiratory effort normal, BS equal bilaterally without rales, rhonchi, wheezing or stridor.  Cardio: RRR with no MRGs. Brisk peripheral pulses without edema.  Abdomen: Soft, + BS.  Non tender, no guarding, rebound, hernias,  masses. Lymphatics: Non tender without lymphadenopathy.  Musculoskeletal: Full ROM, 5/5 strength , normal gait. Right hand 4/5 grip strength, tenderness noted over right lateral epicondyle Skin: Warm, dry without rashes, lesions, ecchymosis.  Neuro: Cranial nerves intact. Normal muscle tone, no cerebellar symptoms. Sensation intact.  Psych: Awake and oriented X 3, normal affect, Insight and Judgment appropriate.     Alycia Rossetti, NP 3:58 PM [facility]

## 2022-01-24 NOTE — Patient Instructions (Signed)
Meloxicam daily for 7 days Prednisone taper Continue brace  If no improvement at end of steroid course will order imaging.  Tennis Elbow  Tennis elbow (lateral epicondylitis) is inflammation of tendons in your outer forearm, near your elbow. Tendons are tissues that connect muscle to bone. When you have tennis elbow, inflammation affects the tendons that you use to bend your wrist and move your hand up. Inflammation occurs in the lower part of the upper arm bone (humerus), where the tendons connect to the bone (lateral epicondyle). Tennis elbow often affects people who play tennis, but anyone may get the condition from repeatedly extending the wrist or turning the forearm. What are the causes? This condition is usually caused by repeatedly extending the wrist, turning the forearm, and using the hands. It can result from sports or work that requires repetitive forearm movements. In some cases, it may be caused by a sudden injury. What increases the risk? You are more likely to develop tennis elbow if you play tennis or another racket sport. You also have a higher risk if you frequently use your hands for work. Besides people who play tennis, others at greater risk include: People who use computers. Architect workers. People who work in [employer]. Musicians. Cooks. Cashiers. What are the signs or symptoms? Symptoms of this condition include: Pain and tenderness in the forearm and the outer part of the elbow. Pain may be felt only when using the arm, or it may be there all the time. A burning feeling that starts in the elbow and spreads down the forearm. A weak grip in the hand. How is this diagnosed? This condition is diagnosed based on your symptoms, your medical history, and a physical exam. You may also have X-rays or an MRI to: Confirm the diagnosis. Look for other issues. Check for tears in the ligaments, muscles, or tendons. How is this treated? Resting and icing your arm is  often the first treatment. Your health care provider may also recommend: Medicines to reduce pain and inflammation. These may be in the form of a pill, topical gels, or shots of a steroid medicine (cortisone). An elbow strap to reduce stress on the area. Physical therapy. This may include massage or exercises or both. An elbow brace to restrict the movements that cause symptoms. If these treatments do not help relieve your symptoms, your health care provider may recommend surgery to remove damaged muscle and reattach healthy muscle to bone. Follow these instructions at home: If you have a brace or strap: Wear the brace or strap as told by your health care provider. Remove it only as told by your health care provider. Check the skin around the brace or strap every day. Tell your health care provider about any concerns. Loosen the brace if your fingers tingle, become numb, or turn cold and blue. Keep the brace clean. If the brace or strap is not waterproof: Do not let it get wet. Cover it with a watertight covering when you take a bath or a shower. Managing pain, stiffness, and swelling  If directed, put ice on the injured area. To do this: If you have a removable brace or strap, remove it as told by your health care provider. Put ice in a plastic bag. Place a towel between your skin and the bag. Leave the ice on for 20 minutes, 2-3 times a day. Remove the ice if your skin turns bright red. This is very important. If you cannot feel pain, heat, or cold, you  have a greater risk of damage to the area. Move your fingers often to reduce stiffness and swelling. Activity Rest your elbow and wrist and avoid activities that cause symptoms as told by your health care provider. Do physical therapy exercises as told by your health care provider. If you lift an object, lift it with your palm facing up. This reduces stress on your elbow. Lifestyle If your tennis elbow is caused by sports, check your  equipment and make sure that: You use it correctly. It is good match for you. If your tennis elbow is caused by work or computer use, take frequent breaks to stretch your arm. Talk with your employer about ways to manage your condition at work. General instructions Take over-the-counter and prescription medicines only as told by your health care provider. Do not use any products that contain nicotine or tobacco. These products include cigarettes, chewing tobacco, and vaping devices, such as e-cigarettes. If you need help quitting, ask your health care provider. Keep all follow-up visits. This is important. How is this prevented? Before and after activity: Warm up and stretch before being active. Cool down and stretch after being active. Give your body time to rest between periods of activity. During activity: Make sure to use equipment that fits you. If you play tennis, put power in your stroke with your lower body. Avoid using your arm only. Maintain physical fitness, including: Strength. Flexibility. Endurance. Do exercises to strengthen the forearm muscles. Contact a health care provider if: You have pain that gets worse or does not get better with treatment. You have numbness or weakness in your forearm, hand, or fingers. Get help right away if: Your pain is severe. You cannot move your wrist. Summary Tennis elbow (lateral epicondylitis) is inflammation of tendons in your outer forearm, near your elbow. Common symptoms include pain and tenderness in your forearm and the outer part of your elbow. This condition is usually caused by repeatedly extending your wrist, turning your forearm, and using your hands. The first treatment is often resting and icing your arm to relieve symptoms. Further treatment may include taking medicine, getting physical therapy, wearing a brace or strap, or having surgery. This information is not intended to replace advice given to you by your health care  provider. Make sure you discuss any questions you have with your health care provider. Document Revised: 01/11/2020 Document Reviewed: 01/11/2020 Elsevier Patient Education  [place].

## 2022-04-07 ENCOUNTER — Other Ambulatory Visit: Payer: Self-pay | Admitting: Gastroenterology

## 2022-04-15 ENCOUNTER — Encounter: Payer: Self-pay | Admitting: Gastroenterology

## 2022-05-16 NOTE — Progress Notes (Signed)
 Complete Physical  Assessment and Plan:  Encounter for general adult medical examination with abnormal findings 1 year  Labile hypertension - controlled without medication, DASH diet, exercise and monitor at home. Call if greater than 130/80.  -     CBC with Differential/Platelet -    CMP -     TSH -     Urinalysis, Routine w reflex microscopic -     Microalbumin / creatinine urine ratio -     EKG  Hyperlipidemia, unspecified hyperlipidemia type -continue to decrease fatty foods, increase activity.  -     Lipid panel - CMP  ESOPHAGEAL STRICTURE Continue PPI/H2 blocker, diet discussed  Abnormal Glucose Discussed general issues about diabetes pathophysiology and management., Educational material distributed., Suggested low cholesterol diet., Encouraged aerobic exercise., Discussed foot care., Reminded to get yearly retinal exam. -     Hemoglobin A1c  Vitamin D deficiency -     VITAMIN D 25 Hydroxy (Vit-D Deficiency, Fractures)  Gilbert's syndrome -     CMP  Severe Obesity BMI 35 with comorbidity Long discussion about weight loss, diet, and exercise Recommended diet heavy in fruits and veggies and low in animal meats, cheeses, and dairy products, appropriate calorie intake Follow up at next visit   Chronic gout without tophus, unspecified cause, unspecified site Gout- recheck Uric acid as needed, Diet discussed, continue medications.   Insomnia, unspecified type Insomnia- good sleep hygiene discussed, increase day time activity, try melatonin or benadryl if this does not help we will call in sleep medication.   Anxiety-  continue medications, stress management techniques discussed, increase water, good sleep hygiene discussed, increase exercise, and increase veggies.   Gastroesophageal reflux disease, esophagitis presence not specified/EOSINOPHILIC ESOPHAGITIS Continue PPI/H2 blocker, diet discussed  Flu Vaccine need Flu Quad 6 + MOS PF IM given  Medication  management -     Magnesium  Seasonal Allergies Switch from Zyrtec to Allegra to determine if this helps symptoms  Alopecia Continue Minoxidil foam  Prostate Cancer(HCC) Continue to follow with Dr. Tammi Klippel and Dr. Alinda Money Completed radical prostatectomy and radiation  Screening for ischemic heart disease -     EKG 12-Lead  Screening for hematuria or proteinuria -     Urinalysis, Routine w reflex microscopic -     Microalbumin / creatinine urine ratio  Screening for thyroid disorder -     TSH  Screening for AAA (abdominal aortic aneurysm) -     [place], RETROPERITNL ABD,  LTD  Contact dermatitis- unknown cause -Triamcinolone bid, if does no start to improve notify the office   Future Appointments  Date Time Provider [place]  05/20/2022  3:00 PM [facility] PREVISIT RM 50 [facility]  06/17/2022  9:00 AM Ladene Artist, MD [facility]  05/20/2023 10:00 AM Alycia Rossetti, NP [facility] None     Discussed med's effects and SE's. Screening labs and tests as requested with regular follow-up as recommended.  HPI Patient presents for a complete physical. has ESOPHAGEAL STRICTURE; Hyperlipidemia; GERD (gastroesophageal reflux disease); Medication management; Vitamin D deficiency; Obesity (BMI 30.0-34.9); Labile hypertension; Gilbert's syndrome; Gout; Morbid obesity ([place]); Eosinophilic esophagitis; Other abnormal glucose (prediabetes); Prostate cancer ([place]); Alopecia; and Genetic testing on their problem list.   Has colonoscopy scheduled for 06/2022.  Has had prostate cancer- removed the prostate 01/29/21, margins were not clear. PET scan last week which was clear. Completed radiation and radical prostatectomy.  He has been noticing an itchy irritated rash on both lower legs that has been present  for 3 weeks.  Has not started any new products that he can remember.  It becomes so irritated and itchy that it will open and bleed.   His blood pressure has been  controlled at home, today their BP is BP: 110/76   BP Readings from Last 3 Encounters:  05/17/22 110/76  01/24/22 114/70  11/14/21 116/72  He does not workout. He denies chest pain, shortness of breath, dizziness.   BMI is Body mass index is 36.05 kg/m. Has not been working diet and exercise. He has been trying to decrease simple carbs and saturated fats.  Wt Readings from Last 3 Encounters:  05/17/22 277 lb (125.6 kg)  01/24/22 276 lb (125.2 kg)  11/15/21 276 lb 7.3 oz (125.4 kg)   He is on cholesterol medication and denies myalgias. His cholesterol is at goal.  He has decreased red meat, has not made other dietary changes. The cholesterol last visit was:   Lab Results  Component Value Date   CHOL 174 11/14/2021   HDL 35 (L) 11/14/2021   LDLCALC 106 (H) 11/14/2021   TRIG 209 (H) 11/14/2021   CHOLHDL 5.0 (H) 11/14/2021   He has been working on diet and exercise for prediabetes, he is on bASA, he is not on ACE/ARB and denies foot ulcerations, hyperglycemia, hypoglycemia , increased appetite, nausea, paresthesia of the feet, polydipsia, polyuria, visual disturbances, vomiting and weight loss. Last A1C in the office was:  Lab Results  Component Value Date   HGBA1C 6.2 (H) 11/14/2021   Patient is on Vitamin D supplement, 5,000 IU a day.   Lab Results  Component Value Date   VD25OH 34 11/14/2021   Patient is on allopurinol for gout and does not report a recent flare x 2 years.  Lab Results  Component Value Date   LABURIC 5.0 05/17/2021     He is following with urology for his PSA at this time and last results were in normal.     Current Medications:  Current Outpatient Medications on File Prior to Visit  Medication Sig Dispense Refill   allopurinol (ZYLOPRIM) 300 MG tablet TAKE 1 TABLET BY MOUTH DAILY TO PREVENT GOUT 90 tablet 1   cetirizine (ZYRTEC) 10 MG tablet Take 10 mg by mouth daily.     Cholecalciferol (VITAMIN D3) 50 MCG (2000 UT) TABS Take by mouth.      pantoprazole (PROTONIX) 40 MG tablet TAKE 1 TABLET BY MOUTH EVERY DAY 90 tablet 0   vitamin B-12 (CYANOCOBALAMIN) 100 MCG tablet Take 100 mcg by mouth daily.     meloxicam (MOBIC) 7.5 MG tablet Take 1 tablet (7.5 mg total) by mouth daily. (Patient not taking: Reported on 05/17/2022) 30 tablet 2   Minoxidil (MINOXIDIL FOR MEN) 5 % FOAM Apply to area of thinning hair twice daily. Rinse hands and other skin areas after use. (Patient not taking: Reported on 11/14/2021) 60 g 1   No current facility-administered medications on file prior to visit.    Health Maintenance:  Immunization History  Administered Date(s) Administered   Influenza Inj Mdck Quad Pf 04/12/2018   Influenza,inj,Quad PF,6+ Mos 04/05/2019   Influenza-Unspecified 04/12/2018, 04/29/2021   Pneumococcal-Unspecified 11/06/1995   Td 11/01/2004   Tdap 03/01/2015   Tetanus: 2016 Influenza: get in Oct Colonoscopy: 2016 due 5 years Dr. [name] , scheduled for 06/2022 Eye Exam: Due  Dentist:  04/2020, Q104month  Patient Care Team: MUnk Pinto MD as PCP - General (Internal Medicine) WIrine Seal MD as Attending Physician (Urology) SFuller Plan  [name], MD as Consulting Physician (Gastroenterology) Kary Kos, MD as Consulting Physician (Neurosurgery) Katheren Puller, RN as Oncology Nurse Navigator Raynelle Bring, MD as Consulting Physician (Urology) Tyler Pita, MD as Consulting Physician (Radiation Oncology) Delice Bison, Charlestine Massed, NP as Nurse Practitioner (Hematology and Oncology) Harmon Pier, RN as Registered Nurse  Medical History:  Past Medical History:  Diagnosis Date   Allergy    Anemia    Anxiety    DDD (degenerative disc disease)    GERD (gastroesophageal reflux disease)    Gilbert's syndrome    pt denies dx   Gout    Hyperlipidemia    Hypertension    Low back pain    Prostate cancer ([place])    Tubular adenoma of colon 06/2015    Allergies No Known Allergies  SURGICAL HISTORY He  has a past surgical  history that includes Wisdom tooth extraction (Bilateral); Cholesteatoma excision; Vasectomy; Cholecystectomy; prostrate (01/2021); Prostate biopsy; Upper gastrointestinal endoscopy (07/15/2009); Robot assisted laparoscopic radical prostatectomy (N/A, 01/29/2021); and Lymphadenectomy (Bilateral, 01/29/2021). FAMILY HISTORY His family history includes Diabetes in his father; Hypertension in his father; Lung cancer in his mother. SOCIAL HISTORY He  reports that he has never smoked. He has never used smokeless tobacco. He reports current alcohol use. He reports that he does not use drugs.   Review of Systems:  Review of Systems  Constitutional:  Negative for chills and fever.  HENT:  Negative for congestion, hearing loss, sinus pain, sore throat and tinnitus.   Eyes:  Negative for blurred vision and double vision.  Respiratory:  Negative for cough, hemoptysis, sputum production, shortness of breath and wheezing.   Cardiovascular:  Negative for chest pain, palpitations and leg swelling.  Gastrointestinal:  Negative for abdominal pain, constipation, diarrhea, heartburn, nausea and vomiting.  Genitourinary:  Negative for dysuria and urgency.       Minimal leakage of urine since prostatectomy, worse at night  Musculoskeletal:  Negative for back pain, falls, joint pain, myalgias and neck pain.  Skin:  Positive for rash (lower legs).  Neurological:  Negative for dizziness, tingling, tremors, weakness and headaches.  Endo/Heme/Allergies:  Does not bruise/bleed easily.  Psychiatric/Behavioral:  Negative for depression and suicidal ideas. The patient is not nervous/anxious and does not have insomnia.     Physical Exam: Estimated body mass index is 36.05 kg/m as calculated from the following:   Height as of this encounter: 6' 1.5" (1.867 m).   Weight as of this encounter: 277 lb (125.6 kg). BP 110/76   Pulse 71   Temp (!) 97.3 F (36.3 C)   Ht 6' 1.5" (1.867 m)   Wt 277 lb (125.6 kg)   SpO2 97%    BMI 36.05 kg/m   General Appearance: Well nourished, in no apparent distress.  Eyes: PERRLA, EOMs, conjunctiva no swelling or erythema ENT/Mouth: Ear canals clear bilaterally with no erythema, swelling, discharge.  TMs normal bilaterally with no erythema, bulging, or retractions.  Oropharynx clear and moist with no exudate, swelling, or erythema.  Dentition normal.   Neck: Supple, thyroid normal. No bruits, JVD, cervical adenopathy Respiratory: Respiratory effort normal, BS equal bilaterally without rales, rhonchi, wheezing or stridor.  Cardio: RRR without murmurs, rubs or gallops. Brisk peripheral pulses without edema.  Chest: symmetric, with normal excursions Abdomen: Soft, nontender, no guarding, rebound, hernias, masses, or organomegaly. Genitourinary: Deferred to urology Musculoskeletal: Full ROM all peripheral extremities,5/5 strength, and normal gait.  Skin:  Warm, dry . Papular reddened rash of lower legs bilaterally Neuro:  A&Ox3, Cranial nerves intact, reflexes equal bilaterally. Normal muscle tone, no cerebellar symptoms. Sensation intact.  Psych: Normal affect, Insight and Judgment appropriate.   EKG: NSR, no ST changes AAA: < 3 cm  Lanasia Porras E  10:05 AM [facility]

## 2022-05-17 ENCOUNTER — Encounter: Payer: Self-pay | Admitting: Nurse Practitioner

## 2022-05-17 ENCOUNTER — Ambulatory Visit: Payer: 59 | Admitting: Nurse Practitioner

## 2022-05-17 VITALS — BP 110/76 | HR 71 | Temp 97.3°F | Ht 73.5 in | Wt 277.0 lb

## 2022-05-17 DIAGNOSIS — R0989 Other specified symptoms and signs involving the circulatory and respiratory systems: Secondary | ICD-10-CM | POA: Diagnosis not present

## 2022-05-17 DIAGNOSIS — Z1389 Encounter for screening for other disorder: Secondary | ICD-10-CM

## 2022-05-17 DIAGNOSIS — Z136 Encounter for screening for cardiovascular disorders: Secondary | ICD-10-CM | POA: Diagnosis not present

## 2022-05-17 DIAGNOSIS — Z0000 Encounter for general adult medical examination without abnormal findings: Secondary | ICD-10-CM | POA: Diagnosis not present

## 2022-05-17 DIAGNOSIS — Z79899 Other long term (current) drug therapy: Secondary | ICD-10-CM

## 2022-05-17 DIAGNOSIS — K2100 Gastro-esophageal reflux disease with esophagitis, without bleeding: Secondary | ICD-10-CM

## 2022-05-17 DIAGNOSIS — I700 Atherosclerosis of aorta: Secondary | ICD-10-CM

## 2022-05-17 DIAGNOSIS — K200 Eosinophilic esophagitis: Secondary | ICD-10-CM

## 2022-05-17 DIAGNOSIS — E559 Vitamin D deficiency, unspecified: Secondary | ICD-10-CM

## 2022-05-17 DIAGNOSIS — L259 Unspecified contact dermatitis, unspecified cause: Secondary | ICD-10-CM

## 2022-05-17 DIAGNOSIS — Z23 Encounter for immunization: Secondary | ICD-10-CM | POA: Diagnosis not present

## 2022-05-17 DIAGNOSIS — E782 Mixed hyperlipidemia: Secondary | ICD-10-CM

## 2022-05-17 DIAGNOSIS — J302 Other seasonal allergic rhinitis: Secondary | ICD-10-CM

## 2022-05-17 DIAGNOSIS — K222 Esophageal obstruction: Secondary | ICD-10-CM

## 2022-05-17 DIAGNOSIS — R7309 Other abnormal glucose: Secondary | ICD-10-CM

## 2022-05-17 DIAGNOSIS — C61 Malignant neoplasm of prostate: Secondary | ICD-10-CM

## 2022-05-17 DIAGNOSIS — L659 Nonscarring hair loss, unspecified: Secondary | ICD-10-CM

## 2022-05-17 DIAGNOSIS — Z1329 Encounter for screening for other suspected endocrine disorder: Secondary | ICD-10-CM

## 2022-05-17 DIAGNOSIS — Z0001 Encounter for general adult medical examination with abnormal findings: Secondary | ICD-10-CM

## 2022-05-17 MED ORDER — TRIAMCINOLONE ACETONIDE 0.1 % EX OINT: 1.0000 | TOPICAL_OINTMENT | Freq: Two times a day (BID) | CUTANEOUS | 1 refills | Status: DC

## 2022-05-17 NOTE — Patient Instructions (Signed)

## 2022-05-18 LAB — LIPID PANEL
Cholesterol: 175 mg/dL (ref <200)
HDL: 35 mg/dL — ABNORMAL LOW (ref >=40)
LDL Cholesterol (Calc): 106 mg/dL (calc) — ABNORMAL HIGH
Non-HDL Cholesterol (Calc): 140 mg/dL (calc) — ABNORMAL HIGH (ref <130)
Total CHOL/HDL Ratio: 5 (calc) — ABNORMAL HIGH (ref <5.0)
Triglycerides: 223 mg/dL — ABNORMAL HIGH (ref <150)

## 2022-05-18 LAB — COMPLETE METABOLIC PANEL WITH GFR
AG Ratio: 2.1 (calc) (ref 1.0–2.5)
ALT: 22 U/L (ref 9–46)
AST: 20 U/L (ref 10–35)
Albumin: 4.2 g/dL (ref 3.6–5.1)
Alkaline phosphatase (APISO): 92 U/L (ref 35–144)
BUN: 13 mg/dL (ref 7–25)
CO2: 28 mmol/L (ref 20–32)
Calcium: 9.1 mg/dL (ref 8.6–10.3)
Chloride: 106 mmol/L (ref 98–110)
Creat: 1.03 mg/dL (ref 0.70–1.30)
Globulin: 2 g/dL (calc) (ref 1.9–3.7)
Glucose, Bld: 73 mg/dL (ref 65–99)
Potassium: 4.5 mmol/L (ref 3.5–5.3)
Sodium: 141 mmol/L (ref 135–146)
Total Bilirubin: 0.5 mg/dL (ref 0.2–1.2)
Total Protein: 6.2 g/dL (ref 6.1–8.1)
eGFR: 84 mL/min/{1.73_m2} (ref >=60)

## 2022-05-18 LAB — URINALYSIS, ROUTINE W REFLEX MICROSCOPIC
Bilirubin Urine: NEGATIVE
Glucose, UA: NEGATIVE
Hgb urine dipstick: NEGATIVE
Ketones, ur: NEGATIVE
Leukocytes,Ua: NEGATIVE
Nitrite: NEGATIVE
Protein, ur: NEGATIVE
Specific Gravity, Urine: 1.012 (ref 1.001–1.035)
pH: 5.5 (ref 5.0–8.0)

## 2022-05-18 LAB — CBC WITH DIFFERENTIAL/PLATELET
Absolute Monocytes: 618 cells/uL (ref 200–950)
Basophils Absolute: 28 cells/uL (ref 0–200)
Basophils Relative: 0.4 %
Eosinophils Absolute: 419 cells/uL (ref 15–500)
Eosinophils Relative: 5.9 %
HCT: 39.7 % (ref 38.5–50.0)
Hemoglobin: 13.6 g/dL (ref 13.2–17.1)
Lymphs Abs: 1093 cells/uL (ref 850–3900)
MCH: 29.8 pg (ref 27.0–33.0)
MCHC: 34.3 g/dL (ref 32.0–36.0)
MCV: 87.1 fL (ref 80.0–100.0)
MPV: 9.6 fL (ref 7.5–12.5)
Monocytes Relative: 8.7 %
Neutro Abs: 4942 cells/uL (ref 1500–7800)
Neutrophils Relative %: 69.6 %
Platelets: 237 10*3/uL (ref 140–400)
RBC: 4.56 10*6/uL (ref 4.20–5.80)
RDW: 13.5 % (ref 11.0–15.0)
Total Lymphocyte: 15.4 %
WBC: 7.1 10*3/uL (ref 3.8–10.8)

## 2022-05-18 LAB — HEMOGLOBIN A1C
Hgb A1c MFr Bld: 6.1 % of total Hgb — ABNORMAL HIGH (ref <5.7)
Mean Plasma Glucose: 128 mg/dL
eAG (mmol/L): 7.1 mmol/L

## 2022-05-18 LAB — MICROALBUMIN / CREATININE URINE RATIO
Creatinine, Urine: 70 mg/dL (ref 20–320)
Microalb Creat Ratio: 4 mcg/mg creat (ref <30)
Microalb, Ur: 0.3 mg/dL

## 2022-05-18 LAB — MAGNESIUM: Magnesium: 1.9 mg/dL (ref 1.5–2.5)

## 2022-05-18 LAB — VITAMIN D 25 HYDROXY (VIT D DEFICIENCY, FRACTURES): Vit D, 25-Hydroxy: 39 ng/mL (ref 30–100)

## 2022-05-18 LAB — TSH: TSH: 2.51 mIU/L (ref 0.40–4.50)

## 2022-05-20 ENCOUNTER — Ambulatory Visit (AMBULATORY_SURGERY_CENTER): Payer: Self-pay

## 2022-05-20 VITALS — Ht 73.0 in | Wt 273.0 lb

## 2022-05-20 DIAGNOSIS — Z86010 Personal history of colonic polyps: Secondary | ICD-10-CM

## 2022-05-20 NOTE — Progress Notes (Signed)

## 2022-06-13 ENCOUNTER — Encounter: Payer: Self-pay | Admitting: Gastroenterology

## 2022-06-17 ENCOUNTER — Ambulatory Visit (AMBULATORY_SURGERY_CENTER): Payer: 59 | Admitting: Gastroenterology

## 2022-06-17 ENCOUNTER — Encounter: Payer: Self-pay | Admitting: Gastroenterology

## 2022-06-17 VITALS — BP 114/67 | HR 66 | Temp 97.5°F | Resp 12 | Ht 74.0 in | Wt 273.0 lb

## 2022-06-17 DIAGNOSIS — Z86010 Personal history of colonic polyps: Secondary | ICD-10-CM | POA: Diagnosis not present

## 2022-06-17 DIAGNOSIS — Z860101 Personal history of adenomatous and serrated colon polyps: Secondary | ICD-10-CM

## 2022-06-17 DIAGNOSIS — Z09 Encounter for follow-up examination after completed treatment for conditions other than malignant neoplasm: Secondary | ICD-10-CM | POA: Diagnosis present

## 2022-06-17 MED ORDER — SODIUM CHLORIDE 0.9 % IV SOLN: 500.0000 mL | INTRAVENOUS | Status: DC

## 2022-06-17 NOTE — Progress Notes (Signed)
Pt resting comfortably. VSS. Airway intact. SBAR complete to RN. All questions answered.   

## 2022-06-17 NOTE — Progress Notes (Signed)
Pt's states no medical or surgical changes since previsit or office visit. 

## 2022-06-17 NOTE — Progress Notes (Signed)
History & Physical  Primary Care Physician:  Jack Pinto, MD Primary Gastroenterologist: Lucio Edward, MD  CHIEF COMPLAINT:  Personal history of colon polyps   HPI: [name] is a 56 y.o. male with a personal history of adenomatous colon polyps for surveillance colonoscopy.   Past Medical History:  Diagnosis Date   Allergy    Anemia    Anxiety    DDD (degenerative disc disease)    GERD (gastroesophageal reflux disease)    Gilbert's syndrome    pt denies dx   Gout    Hyperlipidemia    Low back pain    Prostate cancer ([place])    Tubular adenoma of colon 06/2015    Past Surgical History:  Procedure Laterality Date   CHOLECYSTECTOMY     CHOLESTEATOMA EXCISION     LYMPHADENECTOMY Bilateral 01/29/2021   Procedure: LYMPHADENECTOMY, PELVIC;  Surgeon: Raynelle Bring, MD;  Location: [facility];  Service: Urology;  Laterality: Bilateral;   PROSTATE BIOPSY     prostrate  01/2021   ROBOT ASSISTED LAPAROSCOPIC RADICAL PROSTATECTOMY N/A 01/29/2021   Procedure: XI ROBOTIC ASSISTED LAPAROSCOPIC RADICAL PROSTATECTOMY LEVEL 2;  Surgeon: Raynelle Bring, MD;  Location: [facility];  Service: Urology;  Laterality: N/A;   UPPER GASTROINTESTINAL ENDOSCOPY  07/15/2009   VASECTOMY     WISDOM TOOTH EXTRACTION Bilateral     Prior to Admission medications   Medication Sig Start Date End Date Taking? Authorizing Provider  allopurinol (ZYLOPRIM) 300 MG tablet TAKE 1 TABLET BY MOUTH DAILY TO PREVENT GOUT 09/14/21  Yes Liane Comber, NP  cetirizine (ZYRTEC) 10 MG tablet Take 10 mg by mouth daily.   Yes [provider]  Cholecalciferol (VITAMIN D3) 50 MCG (2000 UT) TABS Take by mouth.   Yes [provider]  pantoprazole (PROTONIX) 40 MG tablet TAKE 1 TABLET BY MOUTH EVERY DAY 04/08/22  Yes Ladene Artist, MD  triamcinolone ointment (KENALOG) 0.1 % Apply 1 Application topically 2 (two) times daily. 05/17/22  Yes Alycia Rossetti, NP  vitamin B-12 (CYANOCOBALAMIN) 100 MCG tablet  Take 100 mcg by mouth daily.   Yes [provider]  Melatonin 2.5 MG CHEW Chew by mouth.    [provider]  Minoxidil (MINOXIDIL FOR MEN) 5 % FOAM Apply to area of thinning hair twice daily. Rinse hands and other skin areas after use. Patient not taking: Reported on 11/14/2021 12/01/20   Liane Comber, NP    Current Outpatient Medications  Medication Sig Dispense Refill   allopurinol (ZYLOPRIM) 300 MG tablet TAKE 1 TABLET BY MOUTH DAILY TO PREVENT GOUT 90 tablet 1   cetirizine (ZYRTEC) 10 MG tablet Take 10 mg by mouth daily.     Cholecalciferol (VITAMIN D3) 50 MCG (2000 UT) TABS Take by mouth.     pantoprazole (PROTONIX) 40 MG tablet TAKE 1 TABLET BY MOUTH EVERY DAY 90 tablet 0   triamcinolone ointment (KENALOG) 0.1 % Apply 1 Application topically 2 (two) times daily. 80 g 1   vitamin B-12 (CYANOCOBALAMIN) 100 MCG tablet Take 100 mcg by mouth daily.     Melatonin 2.5 MG CHEW Chew by mouth.     Minoxidil (MINOXIDIL FOR MEN) 5 % FOAM Apply to area of thinning hair twice daily. Rinse hands and other skin areas after use. (Patient not taking: Reported on 11/14/2021) 60 g 1   Current Facility-Administered Medications  Medication Dose Route Frequency Provider Last Rate Last Admin   0.9 %  sodium chloride infusion  500 mL Intravenous Continuous  Ladene Artist, MD        Allergies as of 06/17/2022   (No Known Allergies)    Family History  Problem Relation Age of Onset   Lung cancer Mother        smoker   Diabetes Father    Hypertension Father    Colon cancer Neg Hx    Esophageal cancer Neg Hx    Stomach cancer Neg Hx    Cancer - Colon Neg Hx     Social History   Socioeconomic History   Marital status: Divorced    Spouse name: Not on file   Number of children: 2   Years of education: Not on file   Highest education level: Not on file  Occupational History    Employer: [employer]   Occupation: Pharmacist, hospital: [employer]  Tobacco Use   Smoking  status: Never   Smokeless tobacco: Never  Vaping Use   Vaping Use: Never used  Substance and Sexual Activity   Alcohol use: Yes    Alcohol/week: 0.0 standard drinks of alcohol    Comment: social   Drug use: No   Sexual activity: Not Currently  Other Topics Concern   Not on file  Social History Narrative   1 son and 1 daughter.   Social Determinants of Health   Financial Resource Strain: Not on file  Food Insecurity: Not on file  Transportation Needs: Not on file  Physical Activity: Not on file  Stress: Not on file  Social Connections: Not on file  Intimate Partner Violence: Not on file    Review of Systems:  All systems reviewed were negative except where noted in HPI.   Physical Exam: General:  Alert, well-developed, in NAD Head:  Normocephalic and atraumatic. Eyes:  Sclera clear, no icterus.   Conjunctiva pink. Ears:  Normal auditory acuity. Mouth:  No deformity or lesions.  Neck:  Supple; no masses . Lungs:  Clear throughout to auscultation.   No wheezes, crackles, or rhonchi. No acute distress. Heart:  Regular rate and rhythm; no murmurs. Abdomen:  Soft, nondistended, nontender. No masses, hepatomegaly. No obvious masses.  Normal bowel .    Rectal:  Deferred   Msk:  Symmetrical without gross deformities.. Pulses:  Normal pulses noted. Extremities:  Without edema. Neurologic:  Alert and  oriented x4;  grossly normal neurologically. Skin:  Intact without significant lesions or rashes. Psych:  Alert and cooperative. Normal mood and affect.   Impression / Plan:   Personal history of adenomatous colon polyps for surveillance colonoscopy.  [name]. [name]  06/17/2022, 8:57 AM See Shea Evans, [facility], to contact our on call provider

## 2022-06-17 NOTE — Patient Instructions (Addendum)
Resume previous diet. - Continue present medications. Repeat colonoscopy in 10 years for surveillance.  YOU HAD AN ENDOSCOPIC PROCEDURE TODAY: Refer to the procedure report and other information in the discharge instructions given to you for any specific questions about what was found during the examination. If this information does not answer your questions, please call [place] office at [phone] to clarify.   YOU SHOULD EXPECT: Some feelings of bloating in the abdomen. Passage of more gas than usual. Walking can help get rid of the air that was put into your GI tract during the procedure and reduce the bloating. If you had a lower endoscopy (such as a colonoscopy or flexible sigmoidoscopy) you may notice spotting of blood in your stool or on the toilet paper. Some abdominal soreness may be present for a day or two, also.  DIET: Your first meal following the procedure should be a light meal and then it is ok to progress to your normal diet. A half-sandwich or bowl of soup is an example of a good first meal. Heavy or fried foods are harder to digest and may make you feel nauseous or bloated. Drink plenty of fluids but you should avoid alcoholic beverages for 24 hours. If you had a esophageal dilation, please see attached instructions for diet.    ACTIVITY: Your care partner should take you home directly after the procedure. You should plan to take it easy, moving slowly for the rest of the day. You can resume normal activity the day after the procedure however YOU SHOULD NOT DRIVE, use power tools, machinery or perform tasks that involve climbing or major physical exertion for 24 hours (because of the sedation medicines used during the test).   SYMPTOMS TO REPORT IMMEDIATELY: A gastroenterologist can be reached at any hour. Please call [phone]  for any of the following symptoms:  Following lower endoscopy (colonoscopy, flexible sigmoidoscopy) Excessive amounts of blood in the stool   Significant tenderness, worsening of abdominal pains  Swelling of the abdomen that is new, acute  Fever of 100 or higher   FOLLOW UP:  If any biopsies were taken you will be contacted by phone or by letter within the next 1-3 weeks. Call [phone]  if you have not heard about the biopsies in 3 weeks.  Please also call with any specific questions about appointments or follow up tests.

## 2022-06-17 NOTE — Op Note (Signed)
[place] Patient Name: [name] Procedure Date: 06/17/2022 9:03 AM MRN: [id number] Endoscopist: Ladene Artist , MD, [id number] Age: 55 Referring MD:  Date of Birth: 08-Feb-1966 Gender: Male Account #: [id number] Procedure:                Colonoscopy Indications:              Surveillance: Personal history of adenomatous                            polyps on last colonoscopy > 5 years ago Medicines:                Monitored Anesthesia Care Procedure:                Pre-Anesthesia Assessment:                           - Prior to the procedure, a History and Physical                            was performed, and patient medications and                            allergies were reviewed. The patient's tolerance of                            previous anesthesia was also reviewed. The risks                            and benefits of the procedure and the sedation                            options and risks were discussed with the patient.                            All questions were answered, and informed consent                            was obtained. Prior Anticoagulants: The patient has                            taken no anticoagulant or antiplatelet agents. ASA                            Grade Assessment: II - A patient with mild systemic                            disease. After reviewing the risks and benefits,                            the patient was deemed in satisfactory condition to                            undergo the procedure.  After obtaining informed consent, the colonoscope                            was passed under direct vision. Throughout the                            procedure, the patient's blood pressure, pulse, and                            oxygen saturations were monitored continuously. The                            CF [id number] #[id number] was introduced through the anus                            and advanced to  the the cecum, identified by                            appendiceal orifice and ileocecal valve. The                            ileocecal valve, appendiceal orifice, and rectum                            were photographed. The quality of the bowel                            preparation was excellent. The colonoscopy was                            performed without difficulty. The patient tolerated                            the procedure well. Scope In: 9:09:03 AM Scope Out: 9:22:26 AM Scope Withdrawal Time: 0 hours 10 minutes 29 seconds  Total Procedure Duration: 0 hours 13 minutes 23 seconds  Findings:                 The perianal and digital rectal examinations were                            normal.                           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                           Internal hemorrhoids were found during  retroflexion. The hemorrhoids were small and Grade                            I (internal hemorrhoids that do not prolapse).                           The exam was otherwise without abnormality on                            direct and retroflexion views. Complications:            No immediate complications. Estimated blood loss:                            None. Estimated Blood Loss:     Estimated blood loss: none. 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                           - Internal hemorrhoids.                           - The examination was otherwise normal on direct                            and retroflexion views.                           - No specimens collected. Recommendation:           - Repeat colonoscopy in 10 years for surveillance.                           - Patient has a contact number available for                             emergencies. The signs and symptoms of potential                            delayed complications were discussed with the                            patient. Return to normal activities tomorrow.                            Written discharge instructions were provided to the                            patient.                           - Resume previous diet.                           -  Continue present medications. Ladene Artist, MD 06/17/2022 9:31:20 AM This report has been signed electronically.

## 2022-06-18 ENCOUNTER — Telehealth: Payer: Self-pay

## 2022-06-18 NOTE — Telephone Encounter (Signed)
  Follow up Call-     06/17/2022    8:24 AM 10/20/2020    9:07 AM 10/03/2020    1:40 PM  Call back number  Post procedure Call Back phone  # [phone]  Permission to leave phone message Yes Yes Yes     Patient questions:  Do you have a fever, pain , or abdominal swelling? No. Pain Score  0 *  Have you tolerated food without any problems? Yes.    Have you been able to return to your normal activities? Yes.    Do you have any questions about your discharge instructions: Diet   No. Medications  No. Follow up visit  No.  Do you have questions or concerns about your Care? No.  Actions: * If pain score is 4 or above: No action needed, pain <4.

## 2022-06-27 ENCOUNTER — Encounter: Payer: Self-pay | Admitting: Internal Medicine

## 2022-07-01 ENCOUNTER — Encounter (HOSPITAL_BASED_OUTPATIENT_CLINIC_OR_DEPARTMENT_OTHER): Payer: Self-pay

## 2022-07-01 ENCOUNTER — Emergency Department (HOSPITAL_BASED_OUTPATIENT_CLINIC_OR_DEPARTMENT_OTHER): Payer: 59

## 2022-07-01 ENCOUNTER — Emergency Department (HOSPITAL_BASED_OUTPATIENT_CLINIC_OR_DEPARTMENT_OTHER)
Admission: EM | Admit: 2022-07-01 | Discharge: 2022-07-01 | Disposition: A | Discharge status: Home / Self Care | Payer: 59 | Attending: Emergency Medicine | Admitting: Emergency Medicine

## 2022-07-01 ENCOUNTER — Other Ambulatory Visit: Payer: Self-pay

## 2022-07-01 DIAGNOSIS — S61111A Laceration without foreign body of right thumb with damage to nail, initial encounter: Secondary | ICD-10-CM | POA: Diagnosis present

## 2022-07-01 DIAGNOSIS — Z23 Encounter for immunization: Secondary | ICD-10-CM | POA: Insufficient documentation

## 2022-07-01 DIAGNOSIS — Z8546 Personal history of malignant neoplasm of prostate: Secondary | ICD-10-CM | POA: Diagnosis not present

## 2022-07-01 DIAGNOSIS — W268XXA Contact with other sharp object(s), not elsewhere classified, initial encounter: Secondary | ICD-10-CM | POA: Diagnosis not present

## 2022-07-01 MED ORDER — LIDOCAINE HCL (PF) 1 % IJ SOLN
5.0000 mL | Freq: Once | INTRAMUSCULAR | Status: AC
  Administered 2022-07-01: 5 mL
  Filled 2022-07-01: qty 5

## 2022-07-01 MED ORDER — HYDROCODONE-ACETAMINOPHEN 5-325 MG PO TABS
1.0000 | ORAL_TABLET | Freq: Once | ORAL | Status: AC
  Administered 2022-07-01: 1 via ORAL
  Filled 2022-07-01: qty 1

## 2022-07-01 MED ORDER — TETANUS-DIPHTH-ACELL PERTUSSIS 5-2.5-18.5 LF-MCG/0.5 IM SUSY
0.5000 mL | PREFILLED_SYRINGE | Freq: Once | INTRAMUSCULAR | Status: AC
  Administered 2022-07-01: 0.5 mL via INTRAMUSCULAR
  Filled 2022-07-01: qty 0.5

## 2022-07-01 NOTE — ED Notes (Signed)
Discharge instructions reviewed with patient. Patient verbalizes understanding, no further questions at this time. Medications/prescriptions and follow up information provided. No acute distress noted at time of departure.  

## 2022-07-01 NOTE — ED Triage Notes (Signed)
Laceration to right thumb from sander with 40 grit sandpaper. Bleeding controlled during triage. Pt lightheaded during triage after wrapping finger, vagal response noted.

## 2022-07-01 NOTE — ED Notes (Signed)
Pt. Was sanding some wood today and sanded the R thumb also.    Pt. Has controlled bleeding and a laceration to the R thumb.  Pt. In no distress with pain rated 1/10

## 2022-07-01 NOTE — ED Provider Notes (Signed)
[facility] EMERGENCY DEPARTMENT Provider Note   CSN: [id number] Arrival date & time: 07/01/22  1402     History  Chief Complaint  Patient presents with   Extremity Laceration    [name] is a 56 y.o. male with a past medical history significant for hyperlipidemia, Gilbert's syndrome, and prostate cancer who presents to the ED due to laceration to right thumb.  Patient states he was using a sander and cut his right thumb 15 minutes prior to arrival.  Patient is right-hand dominant.  Last tetanus shot was roughly 7 years ago.  Admits to pain worse with movement and palpation. Hemostasis achieved prior to arrival.   History obtained from patient and past medical records. No interpreter used during encounter.       Home Medications Prior to Admission medications   Medication Sig Start Date End Date Taking? Authorizing Provider  allopurinol (ZYLOPRIM) 300 MG tablet TAKE 1 TABLET BY MOUTH DAILY TO PREVENT GOUT 09/14/21   Liane Comber, NP  cetirizine (ZYRTEC) 10 MG tablet Take 10 mg by mouth daily.    [provider]  Cholecalciferol (VITAMIN D3) 50 MCG (2000 UT) TABS Take by mouth.    [provider]  Melatonin 2.5 MG CHEW Chew by mouth.    [provider]  Minoxidil (MINOXIDIL FOR MEN) 5 % FOAM Apply to area of thinning hair twice daily. Rinse hands and other skin areas after use. Patient not taking: Reported on 11/14/2021 12/01/20   Liane Comber, NP  pantoprazole (PROTONIX) 40 MG tablet TAKE 1 TABLET BY MOUTH EVERY DAY 04/08/22   Ladene Artist, MD  triamcinolone ointment (KENALOG) 0.1 % Apply 1 Application topically 2 (two) times daily. 05/17/22   Alycia Rossetti, NP  vitamin B-12 (CYANOCOBALAMIN) 100 MCG tablet Take 100 mcg by mouth daily.    [provider]      Allergies    Patient has no known allergies.    Review of Systems   Review of Systems  Musculoskeletal:  Positive for arthralgias.  Skin:  Positive for  wound.  All other systems reviewed and are negative.   Physical Exam Updated Vital Signs BP 109/67 (BP Location: Left Arm)   Pulse 71   Temp 97.7 F (36.5 C) (Tympanic)   Resp 18   Ht '6\' 2"'$  (1.88 m)   Wt 122.5 kg   SpO2 99%   BMI 34.67 kg/m  Physical Exam Vitals and nursing note reviewed.  Constitutional:      General: He is not in acute distress.    Appearance: He is not ill-appearing.  HENT:     Head: Normocephalic.  Eyes:     Pupils: Pupils are equal, round, and reactive to light.  Cardiovascular:     Rate and Rhythm: Normal rate and regular rhythm.     Pulses: Normal pulses.     Heart sounds: Normal heart sounds. No murmur heard.    No friction rub. No gallop.  Pulmonary:     Effort: Pulmonary effort is normal.     Breath sounds: Normal breath sounds.  Abdominal:     General: Abdomen is flat. There is no distension.     Palpations: Abdomen is soft.     Tenderness: There is no abdominal tenderness. There is no guarding or rebound.  Musculoskeletal:        General: Normal range of motion.     Cervical back: Neck supple.  Skin:    General: Skin is warm and  dry.     Comments: Laceration to palmar aspect of right thumb. Chipped top portion of nail. No laceration to nailbed.   Neurological:     General: No focal deficit present.     Mental Status: He is alert.  Psychiatric:        Mood and Affect: Mood normal.        Behavior: Behavior normal.     ED Results / Procedures / Treatments   Labs (all labs ordered are listed, but only abnormal results are displayed) Labs Reviewed - No data to display  EKG None  Radiology DG Finger Thumb Right  Result Date: 07/01/2022 CLINICAL DATA:  Trauma EXAM: RIGHT THUMB 2+V COMPARISON:  None Available. FINDINGS: No fracture or malalignment. Laceration at the level of the IP joint. No radiopaque foreign body. IMPRESSION: No acute osseous abnormality. Electronically Signed   By: Donavan Foil M.D.   On: 07/01/2022 15:07     Procedures Procedures    Medications Ordered in ED Medications  HYDROcodone-acetaminophen (NORCO/VICODIN) 5-325 MG per tablet 1 tablet (1 tablet Oral Given 07/01/22 1704)  lidocaine (PF) (XYLOCAINE) 1 % injection 5 mL (5 mLs Infiltration Given 07/01/22 1705)  Tdap (BOOSTRIX) injection 0.5 mL (0.5 mLs Intramuscular Given 07/01/22 1706)    ED Course/ Medical Decision Making/ A&P                           Medical Decision Making Amount and/or Complexity of Data Reviewed Radiology: ordered and independent interpretation performed. Decision-making details documented in ED Course.  Risk Prescription drug management.   56 year old male presents to the ED due to laceration to right thumb while using a sander.  Last tetanus shot 7 years ago.  Patient is right-hand dominant.  Upon arrival, stable vitals.  Patient in no acute distress.  Small laceration to right thumb.  Hemostasis achieved prior to initial evaluation.  Full range of motion of right thumb.  Wound thoroughly cleaned here in the ED. After wound was cleaned, laceration was fully inspected and felt like sutures were not needed given it appears it took off the top layer of skin. Patient agreeable. Bobetta Lime took off top portion of nail, but no nailbed laceration. X-ray personally reviewed and interpreted which is negative for bony fractures.  Wound bandaged and thumb placed in splint. Tetanus updated. Wound care discussed with patient. Strict ED precautions discussed with patient. Patient states understanding and agrees to plan. Patient discharged home in no acute distress and stable vitals       Final Clinical Impression(s) / ED Diagnoses Final diagnoses:  Laceration of right thumb without foreign body with damage to nail, initial encounter    Rx / DC Orders ED Discharge Orders     None         Karie Kirks 07/01/22 1748    Tretha Sciara, MD 07/01/22 2333

## 2022-07-01 NOTE — Discharge Instructions (Addendum)
It was a pleasure taking care of you today. As discussed, your x-ray did not show any broken bones. After further inspection, I do not feel sutures are needed given it took off the top layer of skin. Keep area clean. Follow-up with PCP within the next week for further evaluation. Return to the ER for new or worsening symptoms.

## 2022-07-04 ENCOUNTER — Other Ambulatory Visit: Payer: Self-pay | Admitting: Gastroenterology

## 2022-09-10 ENCOUNTER — Other Ambulatory Visit: Payer: Self-pay | Admitting: Nurse Practitioner

## 2022-09-10 ENCOUNTER — Telehealth: Payer: Self-pay | Admitting: Nurse Practitioner

## 2022-09-10 DIAGNOSIS — M1A9XX0 Chronic gout, unspecified, without tophus (tophi): Secondary | ICD-10-CM

## 2022-09-10 MED ORDER — ALLOPURINOL 300 MG PO TABS: ORAL_TABLET | ORAL | 1 refills | Status: DC

## 2022-09-10 NOTE — Telephone Encounter (Signed)
Refill sent to pharmacy.   

## 2022-09-10 NOTE — Telephone Encounter (Signed)
PT NEEDS A REFILL ON ALLOPURINOL=ZYLOPRIM 300 MG TO PHARM [facility] [place]. PT TRY TO CALL PHARM TO GET REFILLS BUT NEVER GOT ONE

## 2022-10-01 ENCOUNTER — Other Ambulatory Visit: Payer: Self-pay | Admitting: Gastroenterology

## 2022-11-26 NOTE — Progress Notes (Unsigned)
6 MONTH FOLLOW UP  Assessment and Plan:  [name] was seen today for follow-up.  Diagnoses and all orders for this visit:  Labile hypertension Continue medication Monitor blood pressure at home; call if consistently over 130/80 Continue DASH diet.   Reminder to go to the ER if any CP, SOB, nausea, dizziness, severe HA, changes vision/speech, left arm numbness and tingling and jaw pain. -     CBC with Differential/Platelet -     COMPLETE METABOLIC PANEL WITH GFR -     Magnesium  Hyperlipidemia, unspecified hyperlipidemia type Mild elevations managed with lifestyle LDL goal <100, start med if trending up Continue low cholesterol diet and exercise.  Check lipid panel.  -     Lipid panel -     TSH  Other abnormal glucose (prediabetes) Discussed disease and risks Discussed diet/exercise, weight management  -     Hemoglobin A1c  Chronic gout without tophus, unspecified cause, unspecified site Continue allopurinol; check uric acid PRN  Obesity with co morbidities Long discussion about weight loss, diet, and exercise Discussed final goal weight and current weight loss goal  Patient will work on portions, increase fiber Patient on phentermine with benefit and no SE, taking drug breaks; continue close follow up. Return in 6 months.   -     phentermine (ADIPEX-P) 37.5 MG tablet; Take 1 tablet every morning prior to first meal, for dieting & weightloss  Medication management -     CBC with Differential/Platelet -     COMPLETE METABOLIC PANEL WITH GFR -     Magnesium  Vitamin D deficiency Continue supplement  Eosinophilic esophagitis GI following; resent med refill -     fluticasone (FLOVENT HFA) 220 MCG/ACT inhaler; Swallow,  do not inhale,  2 puffs twice a day. Do not eat or drink for 30 minutes after use. Rinse mouth well after use.  Alopecia Didn't tolerate oral finasteride; will resent topical that was discussed to compound pharmacy  -     Finasteride-Minoxidil  0.1-7 % SOLN; Use topically twice a day.  Prostate cancer [facility] Dr. Annabell Howells and oncology panel following - has upcoming appointment to discuss treatment options   Further disposition pending results if labs check today. Discussed med's effects and SE's.   Over 30 minutes of face to face interview, exam, counseling, chart review, and critical decision making was performed.   Future Appointments  Date Time Provider Department Center  11/27/2022 10:30 AM Raynelle Dick, NP [facility] None  05/20/2023 10:00 AM Raynelle Dick, NP [facility] None     HPI 57 y.o. male here for 6 month follow up on hypertension, hyperlipidemia, prediabetes, gout, GERD, morbid obesity and vitamin D def.   He has GERD, hx of esophageal stricture, recently 10/20/2020 had EGD with dilation by Dr. Russella Richardson; showed chronic gastritis and eosinophilic esophagitis, started on pantoprazole 40 mg daily, flovent steroid swallowed BID and planned 6 week follow up in office. Reports sx are improving.   Has been following with Dr. Annabell Howells at [facility] for hx of elevated PSA, had MRI followed by biopsy on 11/22/2020 which showed gleason 4+3=7, has been referred to discussed with cancer center, scheduled with Dr. Kathrynn Running 12/22/2020. He has nocturia x 2 at night, some urgency, otherwise denies sx.   Lab Results  Component Value Date   PSA 3.97 05/17/2020   PSA 2.5 03/11/2017   He had left lumbar rash with pain suspect for shingles that resolved without antiviral, interested in shingrix. Discussed - can  get at pharmacy.   he was prescribed phentermine for weight loss but has been off med for several months ,did find very helpful and lost ? 8 lb.  While on the medication they have lost 2 lbs since last visit. They deny palpitations, anxiety, trouble sleeping, elevated BP.   BMI is There is no height or weight on file to calculate BMI., he is working on diet and exercise. Wt Readings from Last 3 Encounters:  07/01/22 270 lb  (122.5 kg)  06/17/22 273 lb (123.8 kg)  05/20/22 273 lb (123.8 kg)   His blood pressure has been controlled at home, today their BP is   He does not workout. He denies chest pain, shortness of breath, dizziness.  He is on cholesterol medication and denies myalgias. His cholesterol is not at goal. The cholesterol last visit was:   Lab Results  Component Value Date   CHOL 175 05/17/2022   HDL 35 (L) 05/17/2022   LDLCALC 106 (H) 05/17/2022   TRIG 223 (H) 05/17/2022   CHOLHDL 5.0 (H) 05/17/2022   He has been working on diet and exercise for prediabetes, he is on bASA, he is not on ACE/ARB and denies foot ulcerations, hyperglycemia, hypoglycemia , increased appetite, nausea, paresthesia of the feet, polydipsia, polyuria, visual disturbances, vomiting and weight loss. Last A1C in the office was:  Lab Results  Component Value Date   HGBA1C 6.1 (H) 05/17/2022   Patient is on Vitamin D supplement, 5,000 IU a day.   Lab Results  Component Value Date   VD25OH 39 05/17/2022   Patient is on allopurinol for gout and does not report a recent flare x 2 years.  Lab Results  Component Value Date   LABURIC 5.0 05/17/2021          Current Medications:  Current Outpatient Medications on File Prior to Visit  Medication Sig Dispense Refill   allopurinol (ZYLOPRIM) 300 MG tablet TAKE 1 TABLET BY MOUTH DAILY TO PREVENT GOUT 90 tablet 1   cetirizine (ZYRTEC) 10 MG tablet Take 10 mg by mouth daily.     Cholecalciferol (VITAMIN D3) 50 MCG (2000 UT) TABS Take by mouth.     Melatonin 2.5 MG CHEW Chew by mouth.     Minoxidil (MINOXIDIL FOR MEN) 5 % FOAM Apply to area of thinning hair twice daily. Rinse hands and other skin areas after use. (Patient not taking: Reported on 11/14/2021) 60 g 1   pantoprazole (PROTONIX) 40 MG tablet TAKE 1 TABLET BY MOUTH EVERY DAY 90 tablet 0   triamcinolone ointment (KENALOG) 0.1 % Apply 1 Application topically 2 (two) times daily. 80 g 1   vitamin B-12 (CYANOCOBALAMIN)  100 MCG tablet Take 100 mcg by mouth daily.     No current facility-administered medications on file prior to visit.   Medical History:  Past Medical History:  Diagnosis Date   Allergy    Anemia    Anxiety    DDD (degenerative disc disease)    GERD (gastroesophageal reflux disease)    Gilbert's syndrome    pt denies dx   Gout    Hyperlipidemia    Low back pain    Prostate cancer (HCC)    Tubular adenoma of colon 06/2015    Allergies No Known Allergies  SURGICAL HISTORY He  has a past surgical history that includes Wisdom tooth extraction (Bilateral); Cholesteatoma excision; Vasectomy; Cholecystectomy; prostrate (01/2021); Prostate biopsy; Upper gastrointestinal endoscopy (07/15/2009); Robot assisted laparoscopic radical prostatectomy (N/A, 01/29/2021); and Lymphadenectomy (Bilateral,  01/29/2021). FAMILY HISTORY His family history includes Diabetes in his father; Hypertension in his father; Lung cancer in his mother. SOCIAL HISTORY He  reports that he has never smoked. He has never used smokeless tobacco. He reports current alcohol use. He reports that he does not use drugs.   Review of Systems:  Review of Systems  Constitutional:  Negative for malaise/fatigue and weight loss.  HENT:  Negative for hearing loss and tinnitus.   Eyes:  Negative for blurred vision and double vision.  Respiratory:  Negative for cough, shortness of breath and wheezing.   Cardiovascular:  Negative for chest pain, palpitations, orthopnea, claudication and leg swelling.  Gastrointestinal:  Negative for abdominal pain, blood in stool, constipation, diarrhea, heartburn, melena, nausea and vomiting.  Genitourinary:  Positive for urgency (rarely). Negative for dysuria, flank pain, frequency and hematuria.  Musculoskeletal:  Negative for joint pain and myalgias.  Skin:  Negative for rash.  Neurological:  Negative for dizziness, tingling, sensory change, weakness and headaches.  Endo/Heme/Allergies:   Negative for polydipsia.  Psychiatric/Behavioral: Negative.    All other systems reviewed and are negative.   Physical Exam: Estimated body mass index is 34.67 kg/m as calculated from the following:   Height as of 07/01/22: 6\' 2"  (1.88 m).   Weight as of 07/01/22: 270 lb (122.5 kg). There were no vitals taken for this visit.  General Appearance: Well nourished, in no apparent distress.  Eyes: PERRLA, EOMs, conjunctiva no swelling or erythema ENT/Mouth: Ear canals clear bilaterally with no erythema, swelling, discharge.  TMs normal bilaterally with no erythema, bulging, or retractions.  Oropharynx clear and moist with no exudate, swelling, or erythema. Scalloping of tongue.  Dentition normal.   Neck: Supple, thyroid normal. No bruits, JVD, cervical adenopathy Respiratory: Respiratory effort normal, BS equal bilaterally without rales, rhonchi, wheezing or stridor.  Cardio: RRR without murmurs, rubs or gallops. Brisk peripheral pulses without edema.  Chest: symmetric, with normal excursions Abdomen: Soft, nontender, no guarding, rebound, hernias, masses, or organomegaly. Genitourinary: Deferred to urology Musculoskeletal: Full ROM all peripheral extremities,5/5 strength, and normal gait.  Skin:  Warm, dry without rashes, lesions, ecchymosis. Thinning hair in male pattern distribution.  Neuro: A&Ox3, Cranial nerves intact, reflexes equal bilaterally. Normal muscle tone, no cerebellar symptoms. Sensation intact.  Psych: Normal affect, Insight and Judgment appropriate.   Raynelle Dick, NP 12:55 PM [facility]

## 2022-11-27 ENCOUNTER — Encounter: Payer: Self-pay | Admitting: Nurse Practitioner

## 2022-11-27 ENCOUNTER — Ambulatory Visit: Payer: 59 | Admitting: Nurse Practitioner

## 2022-11-27 VITALS — BP 110/72 | HR 72 | Temp 97.8°F | Ht 74.0 in | Wt 279.0 lb

## 2022-11-27 DIAGNOSIS — N5231 Erectile dysfunction following radical prostatectomy: Secondary | ICD-10-CM

## 2022-11-27 DIAGNOSIS — N6342 Unspecified lump in left breast, subareolar: Secondary | ICD-10-CM

## 2022-11-27 DIAGNOSIS — C61 Malignant neoplasm of prostate: Secondary | ICD-10-CM | POA: Diagnosis not present

## 2022-11-27 DIAGNOSIS — R0989 Other specified symptoms and signs involving the circulatory and respiratory systems: Secondary | ICD-10-CM | POA: Diagnosis not present

## 2022-11-27 DIAGNOSIS — Z6835 Body mass index (BMI) 35.0-35.9, adult: Secondary | ICD-10-CM

## 2022-11-27 DIAGNOSIS — R7309 Other abnormal glucose: Secondary | ICD-10-CM

## 2022-11-27 DIAGNOSIS — M1A9XX0 Chronic gout, unspecified, without tophus (tophi): Secondary | ICD-10-CM

## 2022-11-27 DIAGNOSIS — L659 Nonscarring hair loss, unspecified: Secondary | ICD-10-CM

## 2022-11-27 DIAGNOSIS — Z79899 Other long term (current) drug therapy: Secondary | ICD-10-CM

## 2022-11-27 DIAGNOSIS — E559 Vitamin D deficiency, unspecified: Secondary | ICD-10-CM

## 2022-11-27 DIAGNOSIS — K200 Eosinophilic esophagitis: Secondary | ICD-10-CM

## 2022-11-27 DIAGNOSIS — E782 Mixed hyperlipidemia: Secondary | ICD-10-CM

## 2022-11-27 MED ORDER — PHENTERMINE HCL 37.5 MG PO TABS: ORAL_TABLET | ORAL | 2 refills | Status: DC

## 2022-11-27 MED ORDER — SILDENAFIL CITRATE 100 MG PO TABS: 100.0000 mg | ORAL_TABLET | ORAL | 1 refills | Status: AC | PRN

## 2022-11-27 MED ORDER — PHENTERMINE HCL 37.5 MG PO TABS: ORAL_TABLET | ORAL | 5 refills | Status: DC

## 2022-11-27 NOTE — Patient Instructions (Signed)
Left breast mammogram and U/S is ordered- Call Breast center for appointment, if any difficulty scheduling call the office  Breast Tenderness Breast tenderness is a common problem for women of all ages, but may also occur in men. Breast tenderness has many possible causes, including hormone changes, infections, taking certain medicines, and caffeine intake. In women, the pain usually comes and goes with the menstrual cycle, but it can also be constant. Breast tenderness may range from mild discomfort to severe pain. You may have tests, such as a mammogram or an ultrasound, to check for any unusual findings. Having breast tenderness usually does not mean that you have breast cancer. Follow these instructions at home: Managing pain and discomfort  If directed, put ice on the painful area. To do this: Put ice in a plastic bag. Place a towel between your skin and the bag. Leave the ice on for 20 minutes, 2-3 times a day. If your skin turns bright red, remove the ice right away to prevent skin damage. The risk of skin damage is higher if you cannot feel pain, heat, or cold. Wear a supportive bra or chest support: During exercise. While sleeping, if your breasts are very tender. Medicines Take over-the-counter and prescription medicines only as told by your health care provider. If the cause of your pain is an infection, you may be prescribed an antibiotic medicine. If you were prescribed antibiotics, take them as told by your health care provider. Do not stop using the antibiotic even if you start to feel better. Eating and drinking Decrease the amount of caffeine in your diet. Instead, drink more water and choose caffeine-free drinks. Your health care provider may recommend that you lessen the amount of fat in your diet. You can do this by: Limiting fried foods. Cooking foods using methods such as baking, boiling, grilling, and broiling. General instructions  Keep a log of the days and times  when your breasts are most tender. Ask your health care provider how to do breast exams at home. This will help you notice if you have an unusual growth or lump. Keep all follow-up visits. Contact a health care provider if: Any part of your breast is hard, red, and hot to the touch. This may be a sign of infection. You are a woman and have a new or painful lump in your breast that remains after your menstrual period ends. You are not breastfeeding and you have fluid, especially blood or pus, coming out of your nipples. You have a fever. Your pain does not improve or it gets worse. Your pain is interfering with your daily activities. Summary Breast tenderness may range from mild discomfort to severe pain. Breast tenderness has many possible causes, including hormone changes, infections, taking certain medicines, and caffeine intake. It can be treated with ice, wearing a supportive bra or chest support, and medicines. Make changes to your diet as told by your health care provider. This information is not intended to replace advice given to you by your health care provider. Make sure you discuss any questions you have with your health care provider. Document Revised: 09/12/2021 Document Reviewed: 09/12/2021 Elsevier Patient Education  2023 [employer].

## 2022-11-27 NOTE — Addendum Note (Signed)
Addended by: Anda Kraft E on: 11/27/2022 04:13 PM   Modules accepted: Orders

## 2022-11-28 LAB — LIPID PANEL
Cholesterol: 181 mg/dL (ref <200)
HDL: 36 mg/dL — ABNORMAL LOW (ref >=40)
LDL Cholesterol (Calc): 109 mg/dL (calc) — ABNORMAL HIGH
Non-HDL Cholesterol (Calc): 145 mg/dL (calc) — ABNORMAL HIGH (ref <130)
Total CHOL/HDL Ratio: 5 (calc) — ABNORMAL HIGH (ref <5.0)
Triglycerides: 250 mg/dL — ABNORMAL HIGH (ref <150)

## 2022-11-28 LAB — COMPLETE METABOLIC PANEL WITH GFR
AG Ratio: 1.7 (calc) (ref 1.0–2.5)
ALT: 19 U/L (ref 9–46)
AST: 19 U/L (ref 10–35)
Albumin: 4.4 g/dL (ref 3.6–5.1)
Alkaline phosphatase (APISO): 100 U/L (ref 35–144)
BUN: 14 mg/dL (ref 7–25)
CO2: 30 mmol/L (ref 20–32)
Calcium: 9.5 mg/dL (ref 8.6–10.3)
Chloride: 103 mmol/L (ref 98–110)
Creat: 1.11 mg/dL (ref 0.70–1.30)
Globulin: 2.6 g/dL (calc) (ref 1.9–3.7)
Glucose, Bld: 96 mg/dL (ref 65–99)
Potassium: 4.7 mmol/L (ref 3.5–5.3)
Sodium: 140 mmol/L (ref 135–146)
Total Bilirubin: 0.3 mg/dL (ref 0.2–1.2)
Total Protein: 7 g/dL (ref 6.1–8.1)
eGFR: 77 mL/min/{1.73_m2} (ref >=60)

## 2022-11-28 LAB — CBC WITH DIFFERENTIAL/PLATELET
Absolute Monocytes: 827 cells/uL (ref 200–950)
Basophils Absolute: 52 cells/uL (ref 0–200)
Basophils Relative: 0.6 %
Eosinophils Absolute: 374 cells/uL (ref 15–500)
Eosinophils Relative: 4.3 %
HCT: 42.8 % (ref 38.5–50.0)
Hemoglobin: 14.4 g/dL (ref 13.2–17.1)
Lymphs Abs: 1827 cells/uL (ref 850–3900)
MCH: 28.7 pg (ref 27.0–33.0)
MCHC: 33.6 g/dL (ref 32.0–36.0)
MCV: 85.4 fL (ref 80.0–100.0)
MPV: 9.4 fL (ref 7.5–12.5)
Monocytes Relative: 9.5 %
Neutro Abs: 5620 cells/uL (ref 1500–7800)
Neutrophils Relative %: 64.6 %
Platelets: 287 10*3/uL (ref 140–400)
RBC: 5.01 10*6/uL (ref 4.20–5.80)
RDW: 14.1 % (ref 11.0–15.0)
Total Lymphocyte: 21 %
WBC: 8.7 10*3/uL (ref 3.8–10.8)

## 2022-11-28 LAB — HEMOGLOBIN A1C
Hgb A1c MFr Bld: 6.3 % of total Hgb — ABNORMAL HIGH (ref <5.7)
Mean Plasma Glucose: 134 mg/dL
eAG (mmol/L): 7.4 mmol/L

## 2022-11-28 LAB — MAGNESIUM: Magnesium: 2.1 mg/dL (ref 1.5–2.5)

## 2022-11-28 LAB — TSH: TSH: 3.34 mIU/L (ref 0.40–4.50)

## 2022-11-29 ENCOUNTER — Ambulatory Visit
Admission: RE | Admit: 2022-11-29 | Discharge: 2022-11-29 | Disposition: A | Discharge status: Home / Self Care | Payer: 59 | Source: Ambulatory Visit | Attending: Nurse Practitioner | Admitting: Nurse Practitioner

## 2022-11-29 DIAGNOSIS — N6342 Unspecified lump in left breast, subareolar: Secondary | ICD-10-CM

## 2022-12-27 ENCOUNTER — Other Ambulatory Visit: Payer: Self-pay | Admitting: Gastroenterology

## 2023-03-11 ENCOUNTER — Other Ambulatory Visit: Payer: Self-pay | Admitting: Nurse Practitioner

## 2023-03-11 DIAGNOSIS — M1A9XX0 Chronic gout, unspecified, without tophus (tophi): Secondary | ICD-10-CM

## 2023-03-27 ENCOUNTER — Other Ambulatory Visit: Payer: Self-pay | Admitting: Gastroenterology

## 2023-05-19 NOTE — Progress Notes (Unsigned)
 Complete Physical  Assessment and Plan:  Encounter for general adult medical examination with abnormal findings 1 year  Labile hypertension - controlled without medication, DASH diet, exercise and monitor at home. Call if greater than 130/80.  -     CBC with Differential/Platelet -    CMP -     TSH -     Urinalysis, Routine w reflex microscopic -     Microalbumin / creatinine urine ratio -     EKG  Hyperlipidemia, unspecified hyperlipidemia type -continue to decrease fatty foods, increase activity.  -     Lipid panel - CMP  ESOPHAGEAL STRICTURE Continue PPI/H2 blocker, diet discussed  Abnormal Glucose Discussed general issues about diabetes pathophysiology and management., Educational material distributed., Suggested low cholesterol diet., Encouraged aerobic exercise., Discussed foot care., Reminded to get yearly retinal exam. -     Hemoglobin A1c  Vitamin D deficiency -     VITAMIN D 25 Hydroxy (Vit-D Deficiency, Fractures)  Gilbert's syndrome -     CMP  Severe Obesity BMI 35 with comorbidity Long discussion about weight loss, diet, and exercise Recommended diet heavy in fruits and veggies and low in animal meats, cheeses, and dairy products, appropriate calorie intake Follow up at next visit   Chronic gout without tophus, unspecified cause, unspecified site Gout- recheck Uric acid as needed, Diet discussed, continue medications.   Insomnia, unspecified type Insomnia- good sleep hygiene discussed, increase day time activity, try melatonin or benadryl if this does not help we will call in sleep medication.   Anxiety-  continue medications, stress management techniques discussed, increase water, good sleep hygiene discussed, increase exercise, and increase veggies.   Gastroesophageal reflux disease, esophagitis presence not specified/EOSINOPHILIC ESOPHAGITIS Continue PPI/H2 blocker, diet discussed  Flu Vaccine need Flu Quad 6 + MOS PF IM given  Medication  management -     Magnesium  Seasonal Allergies Switch from Zyrtec to Allegra to determine if this helps symptoms  Alopecia Continue Minoxidil foam  Prostate Cancer(HCC) Continue to follow with Dr. Kathrynn Running and Dr. Laverle Patter Completed radical prostatectomy and radiation  Screening for ischemic heart disease -     EKG 12-Lead  Screening for hematuria or proteinuria -     Urinalysis, Routine w reflex microscopic -     Microalbumin / creatinine urine ratio  Screening for thyroid disorder -     TSH  Screening for AAA (abdominal aortic aneurysm) -     [place], RETROPERITNL ABD,  LTD  Contact dermatitis- unknown cause -Triamcinolone bid, if does no start to improve notify the office   Future Appointments  Date Time Provider Department Center  05/20/2023 10:00 AM Raynelle Dick, NP [facility] None  05/19/2024 11:00 AM Raynelle Dick, NP [facility] None     Discussed med's effects and SE's. Screening labs and tests as requested with regular follow-up as recommended.  HPI Patient presents for a complete physical. has ESOPHAGEAL STRICTURE; Hyperlipidemia; GERD (gastroesophageal reflux disease); Medication management; Vitamin D deficiency; Obesity (BMI 30.0-34.9); Labile hypertension; Gilbert's syndrome; Gout; Morbid obesity (HCC); Eosinophilic esophagitis; Other abnormal glucose (prediabetes); Prostate cancer (HCC); Alopecia; and Genetic testing on their problem list.   Has colonoscopy scheduled for 06/2022.  Has had prostate cancer- removed the prostate 01/29/21, margins were not clear. PET scan last week which was clear. Completed radiation and radical prostatectomy.  He has been noticing an itchy irritated rash on both lower legs that has been present for 3 weeks.  Has not started any new products that he  can remember.  It becomes so irritated and itchy that it will open and bleed.   His blood pressure has been controlled at home, today their BP is     BP Readings from Last 3  Encounters:  11/27/22 110/72  07/01/22 109/67  06/17/22 114/67  He does not workout. He denies chest pain, shortness of breath, dizziness.   BMI is There is no height or weight on file to calculate BMI. Has not been working diet and exercise. He has been trying to decrease simple carbs and saturated fats.  Wt Readings from Last 3 Encounters:  11/27/22 279 lb (126.6 kg)  07/01/22 270 lb (122.5 kg)  06/17/22 273 lb (123.8 kg)   He is on cholesterol medication and denies myalgias. His cholesterol is at goal.  He has decreased red meat, has not made other dietary changes. The cholesterol last visit was:   Lab Results  Component Value Date   CHOL 181 11/27/2022   HDL 36 (L) 11/27/2022   LDLCALC 109 (H) 11/27/2022   TRIG 250 (H) 11/27/2022   CHOLHDL 5.0 (H) 11/27/2022   He has been working on diet and exercise for prediabetes, he is on bASA, he is not on ACE/ARB and denies foot ulcerations, hyperglycemia, hypoglycemia , increased appetite, nausea, paresthesia of the feet, polydipsia, polyuria, visual disturbances, vomiting and weight loss. Last A1C in the office was:  Lab Results  Component Value Date   HGBA1C 6.3 (H) 11/27/2022   Patient is on Vitamin D supplement, 5,000 IU a day.   Lab Results  Component Value Date   VD25OH 39 05/17/2022   Patient is on allopurinol for gout and does not report a recent flare x 2 years.  Lab Results  Component Value Date   LABURIC 5.0 05/17/2021     He is following with urology for his PSA at this time and last results were in normal.     Current Medications:  Current Outpatient Medications on File Prior to Visit  Medication Sig Dispense Refill   allopurinol (ZYLOPRIM) 300 MG tablet TAKE 1 TABLET BY MOUTH DAILY TO PREVENT GOUT 90 tablet 1   cetirizine (ZYRTEC) 10 MG tablet Take 10 mg by mouth daily.     Cholecalciferol (VITAMIN D3) 50 MCG (2000 UT) TABS Take by mouth.     Melatonin 2.5 MG CHEW Chew by mouth.     pantoprazole (PROTONIX) 40  MG tablet TAKE 1 TABLET BY MOUTH EVERY DAY 90 tablet 0   phentermine (ADIPEX-P) 37.5 MG tablet Take 1/2 to 1 tablet every morning for dieting & weightloss 30 tablet 2   sildenafil (VIAGRA) 100 MG tablet Take 1 tablet (100 mg total) by mouth as needed for erectile dysfunction. 30 tablet 1   vitamin B-12 (CYANOCOBALAMIN) 100 MCG tablet Take 100 mcg by mouth daily.     No current facility-administered medications on file prior to visit.    Health Maintenance:  Immunization History  Administered Date(s) Administered   Influenza Inj Mdck Quad Pf 04/12/2018   Influenza,inj,Quad PF,6+ Mos 04/05/2019, 05/17/2022   Influenza-Unspecified 04/12/2018, 04/29/2021   Pneumococcal-Unspecified 11/06/1995   Td 11/01/2004   Tdap 03/01/2015, 07/01/2022   Zoster Recombinant(Shingrix) 08/25/2022, 12/17/2022   Tetanus: 2016 Influenza: get in Oct Colonoscopy: 2016 due 5 years Dr. [name] , scheduled for 06/2022 Eye Exam: Due  Dentist:  04/2020, Q42months  Patient Care Team: Lucky Cowboy, MD as PCP - General (Internal Medicine) Bjorn Pippin, MD as Attending Physician (Urology) Meryl Dare, MD as Consulting  Physician (Gastroenterology) Donalee Citrin, MD as Consulting Physician (Neurosurgery) Cherlyn Cushing, RN as Oncology Nurse Navigator Heloise Purpura, MD as Consulting Physician (Urology) Margaretmary Dys, MD as Consulting Physician (Radiation Oncology) Axel Filler, Larna Daughters, NP as Nurse Practitioner (Hematology and Oncology) Maryclare Labrador, RN as Registered Nurse  Medical History:  Past Medical History:  Diagnosis Date   Allergy    Anemia    Anxiety    DDD (degenerative disc disease)    GERD (gastroesophageal reflux disease)    Gilbert's syndrome    pt denies dx   Gout    Hyperlipidemia    Low back pain    Prostate cancer (HCC)    Tubular adenoma of colon 06/2015    Allergies No Known Allergies  SURGICAL HISTORY He  has a past surgical history that includes Wisdom tooth extraction  (Bilateral); Cholesteatoma excision; Vasectomy; Cholecystectomy; prostrate (01/2021); Prostate biopsy; Upper gastrointestinal endoscopy (07/15/2009); Robot assisted laparoscopic radical prostatectomy (N/A, 01/29/2021); and Lymphadenectomy (Bilateral, 01/29/2021). FAMILY HISTORY His family history includes Diabetes in his father; Hypertension in his father; Lung cancer in his mother. SOCIAL HISTORY He  reports that he has never smoked. He has never used smokeless tobacco. He reports current alcohol use. He reports that he does not use drugs.   Review of Systems:  Review of Systems  Constitutional:  Negative for chills and fever.  HENT:  Negative for congestion, hearing loss, sinus pain, sore throat and tinnitus.   Eyes:  Negative for blurred vision and double vision.  Respiratory:  Negative for cough, hemoptysis, sputum production, shortness of breath and wheezing.   Cardiovascular:  Negative for chest pain, palpitations and leg swelling.  Gastrointestinal:  Negative for abdominal pain, constipation, diarrhea, heartburn, nausea and vomiting.  Genitourinary:  Negative for dysuria and urgency.       Minimal leakage of urine since prostatectomy, worse at night  Musculoskeletal:  Negative for back pain, falls, joint pain, myalgias and neck pain.  Skin:  Positive for rash (lower legs).  Neurological:  Negative for dizziness, tingling, tremors, weakness and headaches.  Endo/Heme/Allergies:  Does not bruise/bleed easily.  Psychiatric/Behavioral:  Negative for depression and suicidal ideas. The patient is not nervous/anxious and does not have insomnia.     Physical Exam: Estimated body mass index is 35.82 kg/m as calculated from the following:   Height as of 11/27/22: 6\' 2"  (1.88 m).   Weight as of 11/27/22: 279 lb (126.6 kg). There were no vitals taken for this visit.  General Appearance: Well nourished, in no apparent distress.  Eyes: PERRLA, EOMs, conjunctiva no swelling or  erythema ENT/Mouth: Ear canals clear bilaterally with no erythema, swelling, discharge.  TMs normal bilaterally with no erythema, bulging, or retractions.  Oropharynx clear and moist with no exudate, swelling, or erythema.  Dentition normal.   Neck: Supple, thyroid normal. No bruits, JVD, cervical adenopathy Respiratory: Respiratory effort normal, BS equal bilaterally without rales, rhonchi, wheezing or stridor.  Cardio: RRR without murmurs, rubs or gallops. Brisk peripheral pulses without edema.  Chest: symmetric, with normal excursions Abdomen: Soft, nontender, no guarding, rebound, hernias, masses, or organomegaly. Genitourinary: Deferred to urology Musculoskeletal: Full ROM all peripheral extremities,5/5 strength, and normal gait.  Skin:  Warm, dry . Papular reddened rash of lower legs bilaterally Neuro: A&Ox3, Cranial nerves intact, reflexes equal bilaterally. Normal muscle tone, no cerebellar symptoms. Sensation intact.  Psych: Normal affect, Insight and Judgment appropriate.   EKG: NSR, no ST changes AAA: < 3 cm  Kirandeep Fariss E  1:15 PM [facility]

## 2023-05-20 ENCOUNTER — Encounter: Payer: Self-pay | Admitting: Nurse Practitioner

## 2023-05-20 ENCOUNTER — Ambulatory Visit: Payer: 59 | Admitting: Nurse Practitioner

## 2023-05-20 VITALS — BP 118/72 | HR 79 | Temp 97.7°F | Ht 73.0 in | Wt 278.6 lb

## 2023-05-20 DIAGNOSIS — E782 Mixed hyperlipidemia: Secondary | ICD-10-CM

## 2023-05-20 DIAGNOSIS — K222 Esophageal obstruction: Secondary | ICD-10-CM

## 2023-05-20 DIAGNOSIS — G4700 Insomnia, unspecified: Secondary | ICD-10-CM

## 2023-05-20 DIAGNOSIS — R7309 Other abnormal glucose: Secondary | ICD-10-CM

## 2023-05-20 DIAGNOSIS — Z136 Encounter for screening for cardiovascular disorders: Secondary | ICD-10-CM

## 2023-05-20 DIAGNOSIS — J302 Other seasonal allergic rhinitis: Secondary | ICD-10-CM

## 2023-05-20 DIAGNOSIS — Z79899 Other long term (current) drug therapy: Secondary | ICD-10-CM

## 2023-05-20 DIAGNOSIS — I700 Atherosclerosis of aorta: Secondary | ICD-10-CM

## 2023-05-20 DIAGNOSIS — N5231 Erectile dysfunction following radical prostatectomy: Secondary | ICD-10-CM

## 2023-05-20 DIAGNOSIS — Z1389 Encounter for screening for other disorder: Secondary | ICD-10-CM

## 2023-05-20 DIAGNOSIS — Z23 Encounter for immunization: Secondary | ICD-10-CM | POA: Diagnosis not present

## 2023-05-20 DIAGNOSIS — K200 Eosinophilic esophagitis: Secondary | ICD-10-CM

## 2023-05-20 DIAGNOSIS — E559 Vitamin D deficiency, unspecified: Secondary | ICD-10-CM

## 2023-05-20 DIAGNOSIS — F419 Anxiety disorder, unspecified: Secondary | ICD-10-CM

## 2023-05-20 DIAGNOSIS — R0989 Other specified symptoms and signs involving the circulatory and respiratory systems: Secondary | ICD-10-CM

## 2023-05-20 DIAGNOSIS — Z1329 Encounter for screening for other suspected endocrine disorder: Secondary | ICD-10-CM

## 2023-05-20 DIAGNOSIS — Z0000 Encounter for general adult medical examination without abnormal findings: Secondary | ICD-10-CM | POA: Diagnosis not present

## 2023-05-20 DIAGNOSIS — M1A9XX0 Chronic gout, unspecified, without tophus (tophi): Secondary | ICD-10-CM

## 2023-05-20 DIAGNOSIS — Z0001 Encounter for general adult medical examination with abnormal findings: Secondary | ICD-10-CM

## 2023-05-20 DIAGNOSIS — C61 Malignant neoplasm of prostate: Secondary | ICD-10-CM

## 2023-05-20 DIAGNOSIS — L659 Nonscarring hair loss, unspecified: Secondary | ICD-10-CM

## 2023-05-20 NOTE — Patient Instructions (Signed)

## 2023-05-21 LAB — MAGNESIUM: Magnesium: 1.9 mg/dL (ref 1.5–2.5)

## 2023-05-21 LAB — URINALYSIS, ROUTINE W REFLEX MICROSCOPIC
Bilirubin Urine: NEGATIVE
Glucose, UA: NEGATIVE
Hgb urine dipstick: NEGATIVE
Ketones, ur: NEGATIVE
Leukocytes,Ua: NEGATIVE
Nitrite: NEGATIVE
Protein, ur: NEGATIVE
Specific Gravity, Urine: 1.014 (ref 1.001–1.035)
pH: 5.5 (ref 5.0–8.0)

## 2023-05-21 LAB — LIPID PANEL
Cholesterol: 177 mg/dL (ref <200)
HDL: 37 mg/dL — ABNORMAL LOW (ref >=40)
LDL Cholesterol (Calc): 105 mg/dL — ABNORMAL HIGH
Non-HDL Cholesterol (Calc): 140 mg/dL — ABNORMAL HIGH (ref <130)
Total CHOL/HDL Ratio: 4.8 (calc) (ref <5.0)
Triglycerides: 234 mg/dL — ABNORMAL HIGH (ref <150)

## 2023-05-21 LAB — CBC WITH DIFFERENTIAL/PLATELET
Absolute Lymphocytes: 1411 {cells}/uL (ref 850–3900)
Absolute Monocytes: 468 {cells}/uL (ref 200–950)
Basophils Absolute: 51 {cells}/uL (ref 0–200)
Basophils Relative: 0.6 %
Eosinophils Absolute: 315 {cells}/uL (ref 15–500)
Eosinophils Relative: 3.7 %
HCT: 42.9 % (ref 38.5–50.0)
Hemoglobin: 14 g/dL (ref 13.2–17.1)
MCH: 28.7 pg (ref 27.0–33.0)
MCHC: 32.6 g/dL (ref 32.0–36.0)
MCV: 87.9 fL (ref 80.0–100.0)
MPV: 9.5 fL (ref 7.5–12.5)
Monocytes Relative: 5.5 %
Neutro Abs: 6256 {cells}/uL (ref 1500–7800)
Neutrophils Relative %: 73.6 %
Platelets: 257 10*3/uL (ref 140–400)
RBC: 4.88 10*6/uL (ref 4.20–5.80)
RDW: 13.5 % (ref 11.0–15.0)
Total Lymphocyte: 16.6 %
WBC: 8.5 10*3/uL (ref 3.8–10.8)

## 2023-05-21 LAB — MICROALBUMIN / CREATININE URINE RATIO
Creatinine, Urine: 130 mg/dL (ref 20–320)
Microalb Creat Ratio: 8 mg/g{creat} (ref <30)
Microalb, Ur: 1.1 mg/dL

## 2023-05-21 LAB — COMPLETE METABOLIC PANEL WITH GFR
AG Ratio: 1.8 (calc) (ref 1.0–2.5)
ALT: 21 U/L (ref 9–46)
AST: 21 U/L (ref 10–35)
Albumin: 4.1 g/dL (ref 3.6–5.1)
Alkaline phosphatase (APISO): 90 U/L (ref 35–144)
BUN: 11 mg/dL (ref 7–25)
CO2: 28 mmol/L (ref 20–32)
Calcium: 9.4 mg/dL (ref 8.6–10.3)
Chloride: 104 mmol/L (ref 98–110)
Creat: 1.09 mg/dL (ref 0.70–1.30)
Globulin: 2.3 g/dL (ref 1.9–3.7)
Glucose, Bld: 180 mg/dL — ABNORMAL HIGH (ref 65–99)
Potassium: 4.5 mmol/L (ref 3.5–5.3)
Sodium: 140 mmol/L (ref 135–146)
Total Bilirubin: 0.4 mg/dL (ref 0.2–1.2)
Total Protein: 6.4 g/dL (ref 6.1–8.1)
eGFR: 78 mL/min/{1.73_m2} (ref >=60)

## 2023-05-21 LAB — URIC ACID: Uric Acid, Serum: 6.8 mg/dL (ref 4.0–8.0)

## 2023-05-21 LAB — TSH: TSH: 1.99 m[IU]/L (ref 0.40–4.50)

## 2023-05-21 LAB — HEMOGLOBIN A1C W/OUT EAG: Hgb A1c MFr Bld: 6.4 %{Hb} — ABNORMAL HIGH (ref <5.7)

## 2023-05-21 LAB — VITAMIN D 25 HYDROXY (VIT D DEFICIENCY, FRACTURES): Vit D, 25-Hydroxy: 41 ng/mL (ref 30–100)

## 2023-05-24 IMAGING — PT NM PET TUM IMG SKULL BASE T - THIGH
1 of 7 series · 1 of 25 positions shown · non-contrast
Comparison: None.

CLINICAL DATA: Prostatectomy [DATE].  PSA equal

EXAM:
NUCLEAR MEDICINE PET SKULL BASE TO THIGH
TECHNIQUE: 9.7 mCi F18 Piflufolastat (Pylarify) was injected intravenously.
Full-ring PET imaging was performed from the skull base to thigh
after the radiotracer. CT data was obtained and used for attenuation
correction and anatomic localization.

[Series 3: pet sk_thigh ac · axial · 5.0mm · 4.07mm/px · 1 of 288 slices shown]
[im 144/288]
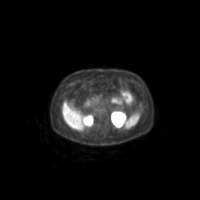

[1 of 25 positions shown; findings below may reference images not displayed]

FINDINGS: NECK

No radiotracer activity in neck lymph nodes.

Incidental CT finding: None

CHEST

No radiotracer accumulation within mediastinal or hilar lymph nodes.
No suspicious pulmonary nodules on the CT scan.

Incidental CT finding: None

ABDOMEN/PELVIS

Prostate: No focal activity in the prostate bed.

Lymph nodes: No abnormal radiotracer accumulation within pelvic or
abdominal nodes.

Liver: No evidence of liver metastasis

Incidental CT finding: None

SKELETON

No focal  activity to suggest skeletal metastasis.
IMPRESSION: 1. No evidence of residual carcinoma the prostatectomy bed.
2. No metastatic pelvic adenopathy or retroperitoneal periaortic
adenopathy by PSMA PET imaging.
3. No visceral metastasis or skeletal metastasis.

## 2023-06-22 ENCOUNTER — Other Ambulatory Visit: Payer: Self-pay | Admitting: Gastroenterology

## 2023-09-09 ENCOUNTER — Other Ambulatory Visit: Payer: Self-pay

## 2023-09-09 DIAGNOSIS — M1A9XX0 Chronic gout, unspecified, without tophus (tophi): Secondary | ICD-10-CM

## 2023-09-09 MED ORDER — ALLOPURINOL 300 MG PO TABS: ORAL_TABLET | ORAL | 0 refills | Status: AC

## 2023-11-19 ENCOUNTER — Ambulatory Visit: Payer: 59 | Admitting: Nurse Practitioner

## 2024-05-19 ENCOUNTER — Encounter: Payer: 59 | Admitting: Nurse Practitioner
